# Patient Record
Sex: Female | Born: 1950
Health system: Southern US, Community
[De-identification: ages and names within clinical notes are randomized; demographics above are authoritative.]

## PROBLEM LIST (undated history)

## (undated) DIAGNOSIS — Z86711 Personal history of pulmonary embolism: Secondary | ICD-10-CM

## (undated) DIAGNOSIS — I10 Essential (primary) hypertension: Secondary | ICD-10-CM

## (undated) DIAGNOSIS — H269 Unspecified cataract: Secondary | ICD-10-CM

## (undated) DIAGNOSIS — N390 Urinary tract infection, site not specified: Secondary | ICD-10-CM

## (undated) DIAGNOSIS — E785 Hyperlipidemia, unspecified: Secondary | ICD-10-CM

## (undated) DIAGNOSIS — N8110 Cystocele, unspecified: Secondary | ICD-10-CM

## (undated) DIAGNOSIS — S22000A Wedge compression fracture of unspecified thoracic vertebra, initial encounter for closed fracture: Secondary | ICD-10-CM

## (undated) DIAGNOSIS — K219 Gastro-esophageal reflux disease without esophagitis: Secondary | ICD-10-CM

## (undated) DIAGNOSIS — L409 Psoriasis, unspecified: Secondary | ICD-10-CM

## (undated) HISTORY — PX: ABDOMINAL HYSTERECTOMY: SHX81

## (undated) HISTORY — DX: Cystocele, unspecified: N81.10

## (undated) HISTORY — PX: EYE SURGERY: SHX253

## (undated) HISTORY — PX: URETER REVISION: SHX493

## (undated) HISTORY — DX: Wedge compression fracture of unspecified thoracic vertebra, initial encounter for closed fracture: S22.000A

## (undated) HISTORY — PX: BLADDER SUSPENSION: SHX72

## (undated) HISTORY — DX: Unspecified cataract: H26.9

## (undated) HISTORY — PX: ANTERIOR AND POSTERIOR VAGINAL REPAIR: SUR5

## (undated) HISTORY — DX: Hyperlipidemia, unspecified: E78.5

## (undated) HISTORY — PX: FRACTURE SURGERY: SHX138

---
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
## 1998-07-11 ENCOUNTER — Ambulatory Visit (HOSPITAL_COMMUNITY): Admission: RE | Admit: 1998-07-11 | Discharge: 1998-07-11 | Payer: Self-pay | Admitting: *Deleted

## 1998-10-25 HISTORY — PX: OTHER SURGICAL HISTORY: SHX169

## 1998-11-25 HISTORY — PX: BLADDER SUSPENSION: SHX72

## 1998-12-15 ENCOUNTER — Inpatient Hospital Stay (HOSPITAL_COMMUNITY): Admission: EM | Admit: 1998-12-15 | Discharge: 1998-12-17 | Payer: Self-pay | Admitting: Obstetrics and Gynecology

## 1998-12-20 ENCOUNTER — Encounter: Payer: Self-pay | Admitting: Obstetrics and Gynecology

## 1998-12-20 ENCOUNTER — Inpatient Hospital Stay (HOSPITAL_COMMUNITY): Admission: EM | Admit: 1998-12-20 | Discharge: 1998-12-22 | Payer: Self-pay | Admitting: Emergency Medicine

## 1998-12-20 ENCOUNTER — Encounter: Payer: Self-pay | Admitting: Emergency Medicine

## 1998-12-31 ENCOUNTER — Inpatient Hospital Stay (HOSPITAL_COMMUNITY): Admission: EM | Admit: 1998-12-31 | Discharge: 1999-01-04 | Payer: Self-pay | Admitting: Urology

## 1998-12-31 ENCOUNTER — Encounter: Payer: Self-pay | Admitting: Urology

## 1999-07-29 ENCOUNTER — Encounter: Payer: Self-pay | Admitting: Family Medicine

## 1999-07-29 ENCOUNTER — Ambulatory Visit (HOSPITAL_COMMUNITY): Admission: RE | Admit: 1999-07-29 | Discharge: 1999-07-29 | Payer: Self-pay | Admitting: Family Medicine

## 2000-08-03 ENCOUNTER — Encounter: Payer: Self-pay | Admitting: Obstetrics and Gynecology

## 2000-08-03 ENCOUNTER — Ambulatory Visit (HOSPITAL_COMMUNITY): Admission: RE | Admit: 2000-08-03 | Discharge: 2000-08-03 | Payer: Self-pay | Admitting: Obstetrics and Gynecology

## 2001-08-04 ENCOUNTER — Encounter: Payer: Self-pay | Admitting: Obstetrics and Gynecology

## 2001-08-04 ENCOUNTER — Ambulatory Visit (HOSPITAL_COMMUNITY): Admission: RE | Admit: 2001-08-04 | Discharge: 2001-08-04 | Payer: Self-pay | Admitting: Obstetrics and Gynecology

## 2001-09-18 ENCOUNTER — Ambulatory Visit (HOSPITAL_COMMUNITY): Admission: RE | Admit: 2001-09-18 | Discharge: 2001-09-18 | Payer: Self-pay | Admitting: Internal Medicine

## 2001-09-18 ENCOUNTER — Encounter: Payer: Self-pay | Admitting: Internal Medicine

## 2002-08-10 ENCOUNTER — Encounter: Payer: Self-pay | Admitting: Family Medicine

## 2002-08-10 ENCOUNTER — Ambulatory Visit (HOSPITAL_COMMUNITY): Admission: RE | Admit: 2002-08-10 | Discharge: 2002-08-10 | Payer: Self-pay | Admitting: Family Medicine

## 2002-08-17 ENCOUNTER — Ambulatory Visit (HOSPITAL_COMMUNITY): Admission: RE | Admit: 2002-08-17 | Discharge: 2002-08-17 | Payer: Self-pay | Admitting: Gastroenterology

## 2002-08-17 ENCOUNTER — Encounter (INDEPENDENT_AMBULATORY_CARE_PROVIDER_SITE_OTHER): Payer: Self-pay | Admitting: *Deleted

## 2003-08-16 ENCOUNTER — Encounter: Payer: Self-pay | Admitting: Family Medicine

## 2003-08-16 ENCOUNTER — Ambulatory Visit (HOSPITAL_COMMUNITY): Admission: RE | Admit: 2003-08-16 | Discharge: 2003-08-16 | Payer: Self-pay | Admitting: Family Medicine

## 2004-09-04 ENCOUNTER — Ambulatory Visit (HOSPITAL_COMMUNITY): Admission: RE | Admit: 2004-09-04 | Discharge: 2004-09-04 | Payer: Self-pay | Admitting: Family Medicine

## 2005-11-16 ENCOUNTER — Ambulatory Visit (HOSPITAL_COMMUNITY): Admission: RE | Admit: 2005-11-16 | Discharge: 2005-11-16 | Payer: Self-pay | Admitting: Family Medicine

## 2006-11-18 ENCOUNTER — Ambulatory Visit (HOSPITAL_COMMUNITY): Admission: RE | Admit: 2006-11-18 | Discharge: 2006-11-18 | Payer: Self-pay | Admitting: Family Medicine

## 2007-11-23 ENCOUNTER — Ambulatory Visit (HOSPITAL_COMMUNITY): Admission: RE | Admit: 2007-11-23 | Discharge: 2007-11-23 | Payer: Self-pay | Admitting: Family Medicine

## 2008-04-23 ENCOUNTER — Ambulatory Visit (HOSPITAL_COMMUNITY): Admission: RE | Admit: 2008-04-23 | Discharge: 2008-04-23 | Payer: Self-pay | Admitting: Internal Medicine

## 2008-04-24 ENCOUNTER — Ambulatory Visit (HOSPITAL_COMMUNITY): Admission: RE | Admit: 2008-04-24 | Discharge: 2008-04-24 | Payer: Self-pay | Admitting: Internal Medicine

## 2008-05-03 ENCOUNTER — Encounter: Admission: RE | Admit: 2008-05-03 | Discharge: 2008-05-03 | Payer: Self-pay | Admitting: Internal Medicine

## 2008-07-02 ENCOUNTER — Ambulatory Visit (HOSPITAL_COMMUNITY): Admission: RE | Admit: 2008-07-02 | Discharge: 2008-07-02 | Payer: Self-pay | Admitting: Internal Medicine

## 2008-10-25 HISTORY — PX: EXTENSOR TENDON OF FOREARM / WRIST REPAIR: SHX1547

## 2008-12-06 ENCOUNTER — Ambulatory Visit (HOSPITAL_COMMUNITY): Admission: RE | Admit: 2008-12-06 | Discharge: 2008-12-06 | Payer: Self-pay | Admitting: Family Medicine

## 2009-08-22 ENCOUNTER — Ambulatory Visit (HOSPITAL_COMMUNITY): Admission: RE | Admit: 2009-08-22 | Discharge: 2009-08-22 | Payer: Self-pay | Admitting: Internal Medicine

## 2009-10-25 HISTORY — PX: BREAST CYST ASPIRATION: SHX578

## 2009-12-12 ENCOUNTER — Ambulatory Visit (HOSPITAL_COMMUNITY): Admission: RE | Admit: 2009-12-12 | Discharge: 2009-12-12 | Payer: Self-pay | Admitting: Internal Medicine

## 2009-12-22 ENCOUNTER — Encounter: Admission: RE | Admit: 2009-12-22 | Discharge: 2009-12-22 | Payer: Self-pay | Admitting: Internal Medicine

## 2010-12-09 ENCOUNTER — Other Ambulatory Visit (HOSPITAL_COMMUNITY): Payer: Self-pay | Admitting: Family Medicine

## 2010-12-09 DIAGNOSIS — Z1231 Encounter for screening mammogram for malignant neoplasm of breast: Secondary | ICD-10-CM

## 2010-12-24 ENCOUNTER — Ambulatory Visit (HOSPITAL_COMMUNITY)
Admission: RE | Admit: 2010-12-24 | Discharge: 2010-12-24 | Disposition: A | Discharge status: Home / Self Care | Payer: PRIVATE HEALTH INSURANCE | Source: Ambulatory Visit | Attending: Family Medicine | Admitting: Family Medicine

## 2010-12-24 DIAGNOSIS — Z1231 Encounter for screening mammogram for malignant neoplasm of breast: Secondary | ICD-10-CM | POA: Insufficient documentation

## 2010-12-29 ENCOUNTER — Ambulatory Visit (HOSPITAL_COMMUNITY)
Admission: RE | Admit: 2010-12-29 | Discharge: 2010-12-29 | Disposition: A | Discharge status: Home / Self Care | Payer: PRIVATE HEALTH INSURANCE | Source: Ambulatory Visit | Attending: Internal Medicine | Admitting: Internal Medicine

## 2010-12-29 ENCOUNTER — Other Ambulatory Visit (HOSPITAL_COMMUNITY): Payer: Self-pay | Admitting: Internal Medicine

## 2010-12-29 ENCOUNTER — Other Ambulatory Visit: Payer: Self-pay | Admitting: Family Medicine

## 2010-12-29 DIAGNOSIS — R072 Precordial pain: Secondary | ICD-10-CM | POA: Insufficient documentation

## 2010-12-29 DIAGNOSIS — R928 Other abnormal and inconclusive findings on diagnostic imaging of breast: Secondary | ICD-10-CM

## 2011-01-04 ENCOUNTER — Other Ambulatory Visit: Payer: PRIVATE HEALTH INSURANCE

## 2011-01-04 ENCOUNTER — Ambulatory Visit
Admission: RE | Admit: 2011-01-04 | Discharge: 2011-01-04 | Disposition: A | Discharge status: Home / Self Care | Payer: PRIVATE HEALTH INSURANCE | Source: Ambulatory Visit | Attending: Family Medicine | Admitting: Family Medicine

## 2011-01-04 DIAGNOSIS — R928 Other abnormal and inconclusive findings on diagnostic imaging of breast: Secondary | ICD-10-CM

## 2011-03-12 NOTE — Op Note (Signed)
   NAMESIANA, PANAMENO                         ACCOUNT NO.:  [id number]   MEDICAL RECORD NO.:  [id number]                   PATIENT TYPE:  AMB   LOCATION:  ENDO                                 FACILITY:  [facility]   PHYSICIAN:  Petra Kuba, M.D.                 DATE OF BIRTH:  Jun 19, 1951   DATE OF PROCEDURE:  08/17/2002  DATE OF DISCHARGE:                                 OPERATIVE REPORT   PROCEDURE:  Colonoscopy with polypectomy.   INDICATIONS:  Family history of colon cancer.  Consent was signed after  risks, benefits, methods and options were thoroughly discussed in the past.   MEDICATIONS USED:  1. Demerol 70 mg .  2. Versed 7 mg.   DESCRIPTION OF PROCEDURE:  Rectal inspection is pertinent for small external  hemorrhoids.  Digital examination was negative.  Video colonoscope was  inserted; easily advanced to the level of the ileocecal valve.  With  abdominal pressure we were able to advance into the cecal pole -- which was  identified by the appendiceal orifice and the ileocecal valve.  No obvious  abnormalities were seen on insertion.  The scope was slowly withdrawn.  The prep was adequate.  There was some liquid stool that required washing  and suctioning on slow withdrawal through the colon.  The cecum and  ascending were normal.  A small transverse polyp was seen; this was hot  biopsied x1.  Also another small mid descending polyp was seen and was hot  biopsied x1.  They were both placed in the same container.  No other  abnormalities were seen, as we slowly withdrew back to the rectum.  Once  back in the rectum the scope was then retroflexed, pertinent for some  internal hemorrhoids; possibly one had an old fissure.  Anal-rectal pull-  through confirmed the above.  The scope was reinserted a short way up the  left side of the colon.  Air was suctioned and scope removed.  The patient  tolerated the procedure well, there were no obvious immediate complication.   ENDOSCOPIC DIAGNOSES:  1. Internal/external hemorrhoids; questionably old fissure.  2. Two small polyps hot biopsied in the transverse and descending.  3. Otherwise within normal limits to the cecum.   PLAN:  Await pathology to determine the future colonic screening.  Have him  see back p.r.n., otherwise return to the care of Dr. Clarene Duke for the  customary health care maintenance -- to include yearly guaiac and rectals.                                                 Petra Kuba, M.D.    MEM/MEDQ  D:  08/17/2002  T:  08/17/2002  Job:  [id number]

## 2012-01-25 ENCOUNTER — Other Ambulatory Visit (HOSPITAL_COMMUNITY): Payer: Self-pay | Admitting: Internal Medicine

## 2012-01-25 DIAGNOSIS — Z1231 Encounter for screening mammogram for malignant neoplasm of breast: Secondary | ICD-10-CM

## 2012-02-18 ENCOUNTER — Ambulatory Visit (HOSPITAL_COMMUNITY)
Admission: RE | Admit: 2012-02-18 | Discharge: 2012-02-18 | Disposition: A | Discharge status: Home / Self Care | Payer: BC Managed Care – PPO | Source: Ambulatory Visit | Attending: Internal Medicine | Admitting: Internal Medicine

## 2012-02-18 DIAGNOSIS — Z1231 Encounter for screening mammogram for malignant neoplasm of breast: Secondary | ICD-10-CM

## 2012-02-22 ENCOUNTER — Other Ambulatory Visit: Payer: Self-pay | Admitting: Internal Medicine

## 2012-02-22 DIAGNOSIS — R928 Other abnormal and inconclusive findings on diagnostic imaging of breast: Secondary | ICD-10-CM

## 2012-02-24 ENCOUNTER — Ambulatory Visit
Admission: RE | Admit: 2012-02-24 | Discharge: 2012-02-24 | Disposition: A | Discharge status: Home / Self Care | Payer: BC Managed Care – PPO | Source: Ambulatory Visit | Attending: Internal Medicine | Admitting: Internal Medicine

## 2012-02-24 DIAGNOSIS — R928 Other abnormal and inconclusive findings on diagnostic imaging of breast: Secondary | ICD-10-CM

## 2012-03-23 ENCOUNTER — Other Ambulatory Visit: Payer: Self-pay | Admitting: Urology

## 2012-04-05 ENCOUNTER — Encounter (HOSPITAL_COMMUNITY): Payer: Self-pay | Admitting: Respiratory Therapy

## 2012-04-06 ENCOUNTER — Other Ambulatory Visit (HOSPITAL_COMMUNITY): Payer: Self-pay | Admitting: Orthopaedic Surgery

## 2012-04-06 ENCOUNTER — Encounter (HOSPITAL_COMMUNITY): Payer: Self-pay

## 2012-04-06 ENCOUNTER — Encounter (HOSPITAL_COMMUNITY)
Admission: RE | Admit: 2012-04-06 | Discharge: 2012-04-06 | Disposition: A | Discharge status: Home / Self Care | Payer: BC Managed Care – PPO | Source: Ambulatory Visit | Attending: Orthopaedic Surgery | Admitting: Orthopaedic Surgery

## 2012-04-06 HISTORY — DX: Psoriasis, unspecified: L40.9

## 2012-04-06 HISTORY — DX: Gastro-esophageal reflux disease without esophagitis: K21.9

## 2012-04-06 HISTORY — DX: Urinary tract infection, site not specified: N39.0

## 2012-04-06 LAB — CBC
HCT: 42.2 % (ref 36.0–46.0)
Hemoglobin: 14.3 g/dL (ref 12.0–15.0)
MCV: 89.2 fL (ref 78.0–100.0)
RBC: 4.73 MIL/uL (ref 3.87–5.11)
RDW: 12.9 % (ref 11.5–15.5)
WBC: 8.5 10*3/uL (ref 4.0–10.5)

## 2012-04-06 LAB — SURGICAL PCR SCREEN: Staphylococcus aureus: POSITIVE — AB

## 2012-04-06 MED ORDER — CEFAZOLIN SODIUM-DEXTROSE 2-3 GM-% IV SOLR
2.0000 g | INTRAVENOUS | Status: DC
  Filled 2012-04-06: qty 50

## 2012-04-06 MED ORDER — CEFAZOLIN SODIUM 1-5 GM-% IV SOLN: 1.0000 g | INTRAVENOUS | Status: DC

## 2012-04-06 NOTE — Pre-Procedure Instructions (Signed)
20 Vanessa French  04/06/2012   Your procedure is scheduled on:  04-07-2012  Report to [facility] at 5:30 AM.  Call this number if you have problems the morning of surgery: [phone]   Remember:   Do not eat food:After Midnight.     Take these medicines the morning of surgery with A SIP OF WATER:Omeprazole,Oxycodone if needed   Do not wear jewelry, make-up or nail polish.  Do not wear lotions, powders, or perfumes. You may wear deodorant.  Do not shave 48 hours prior to surgery. Men may shave face and neck.  Do not bring valuables to the hospital.  Contacts, dentures or bridgework may not be worn into surgery.  Leave suitcase in the car. After surgery it may be brought to your room.  For patients admitted to the hospital, checkout time is 11:00 AM the day of discharge.      Special Instructions: CHG Shower Use Special Wash: 1/2 bottle night before surgery and 1/2 bottle morning of surgery.   Please read over the following fact sheets that you were given: Pain Booklet, Coughing and Deep Breathing, MRSA Information and Surgical Site Infection Prevention

## 2012-04-06 NOTE — Progress Notes (Signed)
Office notified of Ancef  Order with documented penicillin allergy. Dr. [name] states okay to give Ancef.

## 2012-04-06 NOTE — H&P (Signed)
  [facility] [employer] [facility], [place] [address], [place], [place] [address] Telephone: [phone] Fax: [phone]  PATIENT: Vanessa, French MR#: [id number] Visit Date: 04/04/2012 CHIEF COMPLAINT: A 61 year-old-female who [place] and [place] suffering a right dominant proximal humerus fracture.   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 Her arm is in a sling and she has pain with range of motion.  CURRENT MEDICATIONS: She is on vitamin D, multivitamins, and also over-the-counter Prilosec plus she has some pain medication for her, fractures only medications.   PREVIOUS SURGERIES: Tonsillectomy and appendectomy as a child, bladder surgery 2000, hand surgery 2011.  FAMILY HISTORY: Positive for colon cancer and hypertension.  SOCIAL HISTORY: She is married to her husband Dimas Aguas. She works as a Charity fundraiser. She does not smoke or drink.  REVIEW OF SYSTEMS: Positive for acid reflux, bladder problems and osteoporosis.   PHYSICAL EXAMINATION: Patient is 5 feet 6inches, 198 pounds, WD, WN, NAD, extraocular movements intact. Good range of motion of the cervical spine. Lungs are clear to auscultation. Heart has regular rate and  rhythm without murmur or gallop. Abdomen is soft and nontender. Radial motor EPL/EVC is active. Median ulnar sensation to the right and left upper extremity is normal.   RADIOGRAPHS: X-rays from [facility] shows a proximal humerus fracture with displacement.  Greater tuberosity appears to be attached to the proximal fragment as does the lesser tuberosity and this extends across the anatomic neck with displacement.   PLAN: We discussed options with her. This is her dominant hand.  Plan would be open reduction internal fixation, plate fixation, preoperative Scalene black, overnight stay in the hospital and office followup 1 week postop. Risks of surgery  discussed, risks of bleeding and infection discussed, risks of damage to arterial venous vessels, nerve neurapraxia with problems with numbness or weakness discussed. She understands and requests to proceed. All questions answered.  Eldred Manges, MD  Auto-Authenticated by Veverly Fells [name], MD.

## 2012-04-06 NOTE — Progress Notes (Signed)
Orders not signed until after pre-admit appointment,will need to do additional labs, EKG and CXR on arrival.

## 2012-04-06 NOTE — H&P (Signed)
  [facility] [employer] [facility], [place] [address] [place], [place] [address] Telephone: [phone] Fax: [phone]  PATIENT: Vanessa French, Vanessa French MR#: [id number] Visit Date: 04/04/2012 CHIEF COMPLAINT: A 61 year-old-female who [place] and [place] suffering a right dominant proximal humerus fracture.   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 Her arm is in a sling and she has pain with range of motion.  CURRENT MEDICATIONS: She is on vitamin D, multivitamins, and also over-the-counter Prilosec plus she has some pain medication for her, fractures only medications.   PREVIOUS SURGERIES: Tonsillectomy and appendectomy as a child, bladder surgery 2000, hand surgery 2011.  FAMILY HISTORY: Positive for colon cancer and hypertension.  SOCIAL HISTORY: She is married to her husband Dimas Aguas. She works as a Charity fundraiser. She does not smoke or drink.  REVIEW OF SYSTEMS: Positive for acid reflux, bladder problems and osteoporosis.   PHYSICAL EXAMINATION: Patient is 5 feet 6inches, 198 pounds, WD, WN, NAD, extraocular movements intact. Good range of motion of the cervical spine. Lungs are clear to auscultation. Heart has regular rate and  rhythm without murmur or gallop. Abdomen is soft and nontender. Radial motor EPL/EVC is active. Median ulnar sensation to the right and left upper extremity is normal.   RADIOGRAPHS: X-rays from [facility] shows a proximal humerus fracture with displacement.  Greater tuberosity appears to be attached to the proximal fragment as does the lesser tuberosity and this extends across the anatomic neck with displacement.   PLAN: We discussed options with her. This is her dominant hand.  Plan would be open reduction internal fixation, plate fixation, preoperative Scalene black, overnight stay in the hospital and office followup 1 week postop. Risks of surgery  discussed, risks of bleeding and infection discussed, risks of damage to arterial venous vessels, nerve neurapraxia with problems with numbness or weakness discussed. She understands and requests to proceed. All questions answered.  Eldred Manges, MD

## 2012-04-06 NOTE — Progress Notes (Signed)
Dr. [name] office notified that we need orders .

## 2012-04-07 ENCOUNTER — Encounter (HOSPITAL_COMMUNITY): Payer: Self-pay | Admitting: *Deleted

## 2012-04-07 ENCOUNTER — Ambulatory Visit (HOSPITAL_COMMUNITY): Payer: BC Managed Care – PPO | Admitting: Critical Care Medicine

## 2012-04-07 ENCOUNTER — Ambulatory Visit (HOSPITAL_COMMUNITY): Payer: BC Managed Care – PPO

## 2012-04-07 ENCOUNTER — Ambulatory Visit (HOSPITAL_COMMUNITY)
Admission: RE | Admit: 2012-04-07 | Discharge: 2012-04-08 | DRG: 219 | Disposition: A | Discharge status: Home / Self Care | Payer: BC Managed Care – PPO | Source: Ambulatory Visit | Attending: Orthopaedic Surgery | Admitting: Orthopaedic Surgery

## 2012-04-07 ENCOUNTER — Encounter (HOSPITAL_COMMUNITY): Payer: Self-pay | Admitting: Critical Care Medicine

## 2012-04-07 ENCOUNTER — Encounter (HOSPITAL_COMMUNITY): Payer: Self-pay | Admitting: General Practice

## 2012-04-07 ENCOUNTER — Encounter (HOSPITAL_COMMUNITY)
Admission: RE | Disposition: A | Discharge status: Home / Self Care | Payer: Self-pay | Source: Ambulatory Visit | Attending: Orthopaedic Surgery

## 2012-04-07 DIAGNOSIS — L408 Other psoriasis: Secondary | ICD-10-CM | POA: Insufficient documentation

## 2012-04-07 DIAGNOSIS — S42201A Unspecified fracture of upper end of right humerus, initial encounter for closed fracture: Secondary | ICD-10-CM

## 2012-04-07 DIAGNOSIS — Y998 Other external cause status: Secondary | ICD-10-CM | POA: Insufficient documentation

## 2012-04-07 DIAGNOSIS — S42209A Unspecified fracture of upper end of unspecified humerus, initial encounter for closed fracture: Secondary | ICD-10-CM | POA: Insufficient documentation

## 2012-04-07 DIAGNOSIS — M65849 Other synovitis and tenosynovitis, unspecified hand: Secondary | ICD-10-CM | POA: Insufficient documentation

## 2012-04-07 DIAGNOSIS — Z01812 Encounter for preprocedural laboratory examination: Secondary | ICD-10-CM | POA: Insufficient documentation

## 2012-04-07 DIAGNOSIS — K219 Gastro-esophageal reflux disease without esophagitis: Secondary | ICD-10-CM | POA: Insufficient documentation

## 2012-04-07 DIAGNOSIS — M65839 Other synovitis and tenosynovitis, unspecified forearm: Secondary | ICD-10-CM | POA: Insufficient documentation

## 2012-04-07 DIAGNOSIS — W64XXXA Exposure to other animate mechanical forces, initial encounter: Secondary | ICD-10-CM | POA: Insufficient documentation

## 2012-04-07 HISTORY — PX: STERIOD INJECTION: SHX5046

## 2012-04-07 HISTORY — PX: ORIF HUMERUS FRACTURE: SHX2126

## 2012-04-07 HISTORY — DX: Unspecified fracture of upper end of right humerus, initial encounter for closed fracture: S42.201A

## 2012-04-07 LAB — COMPREHENSIVE METABOLIC PANEL
ALT: 20 U/L (ref 0–35)
Alkaline Phosphatase: 70 U/L (ref 39–117)
BUN: 12 mg/dL (ref 6–23)
CO2: 29 mEq/L (ref 19–32)
Chloride: 103 mEq/L (ref 96–112)
GFR calc Af Amer: >90 mL/min (ref >90)
GFR calc non Af Amer: >90 mL/min (ref >90)
Glucose, Bld: 109 mg/dL — ABNORMAL HIGH (ref 70–99)
Potassium: 4.5 mEq/L (ref 3.5–5.1)
Sodium: 141 mEq/L (ref 135–145)
Total Bilirubin: 0.6 mg/dL (ref 0.3–1.2)
Total Protein: 7.2 g/dL (ref 6.0–8.3)

## 2012-04-07 LAB — PROTIME-INR: Prothrombin Time: 14.4 seconds (ref 11.6–15.2)

## 2012-04-07 SURGERY — OPEN REDUCTION INTERNAL FIXATION (ORIF) PROXIMAL HUMERUS FRACTURE
Anesthesia: General | Site: Wrist | Laterality: Right | Wound class: Clean

## 2012-04-07 MED ORDER — LACTATED RINGERS IV SOLN
INTRAVENOUS | Status: DC | PRN
  Administered 2012-04-07 (×2): via INTRAVENOUS

## 2012-04-07 MED ORDER — ACETAMINOPHEN 10 MG/ML IV SOLN
INTRAVENOUS | Status: DC | PRN
  Administered 2012-04-07: 1000 mg via INTRAVENOUS

## 2012-04-07 MED ORDER — DIPHENHYDRAMINE HCL 12.5 MG/5ML PO ELIX: 12.5000 mg | ORAL_SOLUTION | ORAL | Status: DC | PRN

## 2012-04-07 MED ORDER — VANCOMYCIN HCL IN DEXTROSE 1-5 GM/200ML-% IV SOLN
1000.0000 mg | Freq: Two times a day (BID) | INTRAVENOUS | Status: AC
  Administered 2012-04-07: 1000 mg via INTRAVENOUS
  Filled 2012-04-07: qty 200

## 2012-04-07 MED ORDER — PHENOL 1.4 % MT LIQD: 1.0000 | OROMUCOSAL | Status: DC | PRN

## 2012-04-07 MED ORDER — ONDANSETRON HCL 4 MG PO TABS: 4.0000 mg | ORAL_TABLET | Freq: Four times a day (QID) | ORAL | Status: DC | PRN

## 2012-04-07 MED ORDER — DOCUSATE SODIUM 100 MG PO CAPS
100.0000 mg | ORAL_CAPSULE | Freq: Two times a day (BID) | ORAL | Status: DC
  Administered 2012-04-07 – 2012-04-08 (×3): 100 mg via ORAL
  Filled 2012-04-07 (×4): qty 1

## 2012-04-07 MED ORDER — METHOCARBAMOL 100 MG/ML IJ SOLN
500.0000 mg | Freq: Four times a day (QID) | INTRAVENOUS | Status: DC | PRN
  Filled 2012-04-07: qty 5

## 2012-04-07 MED ORDER — SENNOSIDES-DOCUSATE SODIUM 8.6-50 MG PO TABS: 1.0000 | ORAL_TABLET | Freq: Every evening | ORAL | Status: DC | PRN

## 2012-04-07 MED ORDER — METHOCARBAMOL 500 MG PO TABS
500.0000 mg | ORAL_TABLET | Freq: Four times a day (QID) | ORAL | Status: DC | PRN
  Administered 2012-04-07 – 2012-04-08 (×2): 500 mg via ORAL
  Filled 2012-04-07 (×2): qty 1

## 2012-04-07 MED ORDER — OXYCODONE-ACETAMINOPHEN 5-325 MG PO TABS: 1.0000 | ORAL_TABLET | ORAL | Status: AC | PRN

## 2012-04-07 MED ORDER — NEOSTIGMINE METHYLSULFATE 1 MG/ML IJ SOLN
INTRAMUSCULAR | Status: DC | PRN
  Administered 2012-04-07: 4 mg via INTRAVENOUS

## 2012-04-07 MED ORDER — ROCURONIUM BROMIDE 100 MG/10ML IV SOLN
INTRAVENOUS | Status: DC | PRN
  Administered 2012-04-07: 50 mg via INTRAVENOUS

## 2012-04-07 MED ORDER — PROPOFOL 10 MG/ML IV EMUL
INTRAVENOUS | Status: DC | PRN
  Administered 2012-04-07: 150 mg via INTRAVENOUS

## 2012-04-07 MED ORDER — BUPIVACAINE HCL 0.5 % IJ SOLN
INTRAMUSCULAR | Status: DC | PRN
  Administered 2012-04-07: 2 mL via INTRA_ARTICULAR

## 2012-04-07 MED ORDER — CEFAZOLIN SODIUM-DEXTROSE 2-3 GM-% IV SOLR
2.0000 g | INTRAVENOUS | Status: AC
  Administered 2012-04-07: 2 g via INTRAVENOUS

## 2012-04-07 MED ORDER — ACETAMINOPHEN 10 MG/ML IV SOLN
INTRAVENOUS | Status: AC
Start: 2012-04-07 — End: 2012-04-07
  Filled 2012-04-07: qty 100

## 2012-04-07 MED ORDER — LORAZEPAM 2 MG/ML IJ SOLN: 1.0000 mg | Freq: Once | INTRAMUSCULAR | Status: DC | PRN

## 2012-04-07 MED ORDER — PHENYLEPHRINE HCL 10 MG/ML IJ SOLN
10.0000 mg | INTRAVENOUS | Status: DC | PRN
  Administered 2012-04-07: 25 ug/min via INTRAVENOUS

## 2012-04-07 MED ORDER — MIDAZOLAM HCL 2 MG/2ML IJ SOLN: 1.0000 mg | INTRAMUSCULAR | Status: DC | PRN

## 2012-04-07 MED ORDER — MUPIROCIN 2 % EX OINT
TOPICAL_OINTMENT | CUTANEOUS | Status: AC
  Administered 2012-04-07: 1
  Filled 2012-04-07: qty 22

## 2012-04-07 MED ORDER — DEXAMETHASONE SODIUM PHOSPHATE 4 MG/ML IJ SOLN
INTRAMUSCULAR | Status: DC | PRN
  Administered 2012-04-07: 4 mg via INTRAVENOUS

## 2012-04-07 MED ORDER — ACETAMINOPHEN 325 MG PO TABS: 650.0000 mg | ORAL_TABLET | Freq: Four times a day (QID) | ORAL | Status: DC | PRN

## 2012-04-07 MED ORDER — VECURONIUM BROMIDE 10 MG IV SOLR
INTRAVENOUS | Status: DC | PRN
  Administered 2012-04-07: 2 mg via INTRAVENOUS

## 2012-04-07 MED ORDER — BISACODYL 10 MG RE SUPP: 10.0000 mg | Freq: Every day | RECTAL | Status: DC | PRN

## 2012-04-07 MED ORDER — PHENYLEPHRINE HCL 10 MG/ML IJ SOLN
INTRAMUSCULAR | Status: DC | PRN
  Administered 2012-04-07 (×3): 40 ug via INTRAVENOUS

## 2012-04-07 MED ORDER — FENTANYL CITRATE 0.05 MG/ML IJ SOLN: 50.0000 ug | INTRAMUSCULAR | Status: DC | PRN

## 2012-04-07 MED ORDER — CALCIUM CARBONATE ANTACID 500 MG PO CHEW
1.5000 | CHEWABLE_TABLET | Freq: Every day | ORAL | Status: DC
  Administered 2012-04-07: 200 mg via ORAL
  Filled 2012-04-07 (×2): qty 1.5

## 2012-04-07 MED ORDER — KCL IN DEXTROSE-NACL 20-5-0.45 MEQ/L-%-% IV SOLN
INTRAVENOUS | Status: DC
  Administered 2012-04-07: 13:00:00 via INTRAVENOUS
  Filled 2012-04-07 (×3): qty 1000

## 2012-04-07 MED ORDER — METOCLOPRAMIDE HCL 10 MG PO TABS: 5.0000 mg | ORAL_TABLET | Freq: Three times a day (TID) | ORAL | Status: DC | PRN

## 2012-04-07 MED ORDER — LIDOCAINE HCL (CARDIAC) 20 MG/ML IV SOLN
INTRAVENOUS | Status: DC | PRN
  Administered 2012-04-07: 50 mg via INTRAVENOUS

## 2012-04-07 MED ORDER — CALCIUM CARBONATE ANTACID 750 MG PO CHEW: 1.0000 | CHEWABLE_TABLET | Freq: Every day | ORAL | Status: DC

## 2012-04-07 MED ORDER — MIDAZOLAM HCL 5 MG/5ML IJ SOLN
INTRAMUSCULAR | Status: DC | PRN
  Administered 2012-04-07: 0.5 mg via INTRAVENOUS
  Administered 2012-04-07: 1 mg via INTRAVENOUS
  Administered 2012-04-07: 0.5 mg via INTRAVENOUS

## 2012-04-07 MED ORDER — MENTHOL 3 MG MT LOZG: 1.0000 | LOZENGE | OROMUCOSAL | Status: DC | PRN

## 2012-04-07 MED ORDER — ACETAMINOPHEN 650 MG RE SUPP: 650.0000 mg | Freq: Four times a day (QID) | RECTAL | Status: DC | PRN

## 2012-04-07 MED ORDER — ONDANSETRON HCL 4 MG/2ML IJ SOLN
INTRAMUSCULAR | Status: DC | PRN
  Administered 2012-04-07: 4 mg via INTRAVENOUS

## 2012-04-07 MED ORDER — METHOCARBAMOL 500 MG PO TABS: 500.0000 mg | ORAL_TABLET | Freq: Four times a day (QID) | ORAL | Status: AC

## 2012-04-07 MED ORDER — 0.9 % SODIUM CHLORIDE (POUR BTL) OPTIME
TOPICAL | Status: DC | PRN
  Administered 2012-04-07: 1000 mL

## 2012-04-07 MED ORDER — HYDROCODONE-ACETAMINOPHEN 5-325 MG PO TABS
1.0000 | ORAL_TABLET | ORAL | Status: DC | PRN
  Administered 2012-04-07: 1 via ORAL
  Administered 2012-04-08: 2 via ORAL
  Filled 2012-04-07: qty 2
  Filled 2012-04-07: qty 1

## 2012-04-07 MED ORDER — MORPHINE SULFATE 2 MG/ML IJ SOLN: 1.0000 mg | INTRAMUSCULAR | Status: DC | PRN

## 2012-04-07 MED ORDER — FENTANYL CITRATE 0.05 MG/ML IJ SOLN
INTRAMUSCULAR | Status: DC | PRN
  Administered 2012-04-07: 50 ug via INTRAVENOUS
  Administered 2012-04-07: 25 ug via INTRAVENOUS
  Administered 2012-04-07: 75 ug via INTRAVENOUS
  Administered 2012-04-07: 50 ug via INTRAVENOUS
  Administered 2012-04-07: 25 ug via INTRAVENOUS

## 2012-04-07 MED ORDER — METHYLPREDNISOLONE ACETATE 40 MG/ML IJ SUSP
INTRAMUSCULAR | Status: DC | PRN
  Administered 2012-04-07: 40 mg via INTRA_ARTICULAR

## 2012-04-07 MED ORDER — PANTOPRAZOLE SODIUM 40 MG PO TBEC
40.0000 mg | DELAYED_RELEASE_TABLET | Freq: Every day | ORAL | Status: DC
  Administered 2012-04-07: 40 mg via ORAL
  Filled 2012-04-07: qty 1

## 2012-04-07 MED ORDER — GLYCOPYRROLATE 0.2 MG/ML IJ SOLN
INTRAMUSCULAR | Status: DC | PRN
  Administered 2012-04-07: 0.6 mg via INTRAVENOUS

## 2012-04-07 MED ORDER — KETOROLAC TROMETHAMINE 30 MG/ML IJ SOLN
30.0000 mg | Freq: Four times a day (QID) | INTRAMUSCULAR | Status: DC
  Administered 2012-04-07 (×2): 30 mg via INTRAVENOUS
  Filled 2012-04-07 (×6): qty 1

## 2012-04-07 MED ORDER — METOCLOPRAMIDE HCL 5 MG/ML IJ SOLN: 5.0000 mg | Freq: Three times a day (TID) | INTRAMUSCULAR | Status: DC | PRN

## 2012-04-07 MED ORDER — OXYCODONE-ACETAMINOPHEN 5-325 MG PO TABS: 1.0000 | ORAL_TABLET | ORAL | Status: DC | PRN

## 2012-04-07 MED ORDER — HYDROMORPHONE HCL PF 1 MG/ML IJ SOLN: 0.2500 mg | INTRAMUSCULAR | Status: DC | PRN

## 2012-04-07 MED ORDER — ZOLPIDEM TARTRATE 5 MG PO TABS: 5.0000 mg | ORAL_TABLET | Freq: Every evening | ORAL | Status: DC | PRN

## 2012-04-07 MED ORDER — ONDANSETRON HCL 4 MG/2ML IJ SOLN: 4.0000 mg | Freq: Four times a day (QID) | INTRAMUSCULAR | Status: DC | PRN

## 2012-04-07 SURGICAL SUPPLY — 59 items
3.8mm Multi Directional screw x20mm ×1 IMPLANT
ADH SKN CLS APL DERMABOND .7 (GAUZE/BANDAGES/DRESSINGS) ×2
APL SKNCLS STERI-STRIP NONHPOA (GAUZE/BANDAGES/DRESSINGS) ×2
BENZOIN TINCTURE PRP APPL 2/3 (GAUZE/BANDAGES/DRESSINGS) ×3 IMPLANT
BIT DRILL 2.8X4 QC CORT (BIT) ×1 IMPLANT
BIT DRILL 4 LONG FAST STEP (BIT) ×2 IMPLANT
BIT DRILL 4 SHORT FAST STEP (BIT) ×1 IMPLANT
CLOTH BEACON ORANGE TIMEOUT ST (SAFETY) ×3 IMPLANT
COVER SURGICAL LIGHT HANDLE (MISCELLANEOUS) ×3 IMPLANT
DERMABOND ADVANCED (GAUZE/BANDAGES/DRESSINGS) ×1
DERMABOND ADVANCED .7 DNX12 (GAUZE/BANDAGES/DRESSINGS) IMPLANT
DRAPE C-ARM 42X72 X-RAY (DRAPES) ×3 IMPLANT
DRAPE INCISE IOBAN 66X45 STRL (DRAPES) IMPLANT
DRAPE SURG 17X23 STRL (DRAPES) ×3 IMPLANT
DRAPE U-SHAPE 47X51 STRL (DRAPES) ×3 IMPLANT
DRSG EMULSION OIL 3X3 NADH (GAUZE/BANDAGES/DRESSINGS) ×3 IMPLANT
DRSG MEPILEX BORDER 4X4 (GAUZE/BANDAGES/DRESSINGS) ×2 IMPLANT
DRSG MEPILEX BORDER 4X8 (GAUZE/BANDAGES/DRESSINGS) ×1 IMPLANT
ELECT REM PT RETURN 9FT ADLT (ELECTROSURGICAL) ×3
ELECTRODE REM PT RTRN 9FT ADLT (ELECTROSURGICAL) ×2 IMPLANT
GLOVE BIOGEL PI IND STRL 7.5 (GLOVE) ×2 IMPLANT
GLOVE BIOGEL PI IND STRL 8 (GLOVE) ×2 IMPLANT
GLOVE BIOGEL PI INDICATOR 7.5 (GLOVE) ×1
GLOVE BIOGEL PI INDICATOR 8 (GLOVE) ×1
GLOVE ECLIPSE 7.0 STRL STRAW (GLOVE) ×3 IMPLANT
GLOVE ORTHO TXT STRL SZ7.5 (GLOVE) ×3 IMPLANT
GOWN PREVENTION PLUS LG XLONG (DISPOSABLE) IMPLANT
GOWN STRL NON-REIN LRG LVL3 (GOWN DISPOSABLE) ×3 IMPLANT
KIT BASIN OR (CUSTOM PROCEDURE TRAY) ×3 IMPLANT
KIT ROOM TURNOVER OR (KITS) ×3 IMPLANT
MANIFOLD NEPTUNE II (INSTRUMENTS) ×3 IMPLANT
NDL HYPO 25GX1X1/2 BEV (NEEDLE) IMPLANT
NEEDLE HYPO 25GX1X1/2 BEV (NEEDLE) IMPLANT
NS IRRIG 1000ML POUR BTL (IV SOLUTION) ×3 IMPLANT
PACK SHOULDER (CUSTOM PROCEDURE TRAY) ×3 IMPLANT
PAD ARMBOARD 7.5X6 YLW CONV (MISCELLANEOUS) ×6 IMPLANT
PEG STND 4.0X37.5MM (Orthopedic Implant) ×3 IMPLANT
PEG STND 4.0X40MM (Orthopedic Implant) ×12 IMPLANT
PEG STND 4.0X47.5MM (Orthopedic Implant) ×3 IMPLANT
PEGSTD 4.0X37.5MM (Orthopedic Implant) IMPLANT
PEGSTD 4.0X40MM (Orthopedic Implant) ×4 IMPLANT
PEGSTD 4.0X47.5MM (Orthopedic Implant) ×1 IMPLANT
PIN GUIDE SHOULDER 2.0MM (PIN) ×2 IMPLANT
PLATE SHOULDER S3 3HOLE RT (Plate) ×1 IMPLANT
SCREW MULTIDIR 3.8X24 HUMRL (Screw) ×3 IMPLANT
SCREW MULTIDIR 3.8X30 HUMRL (Screw) ×1 IMPLANT
SCREW MULTIDIR 3.8X34 HUMRL (Screw) ×1 IMPLANT
SCREW MULTIDIR 3.8X38 HUMRL (Screw) ×1 IMPLANT
SPONGE GAUZE 4X4 12PLY (GAUZE/BANDAGES/DRESSINGS) ×3 IMPLANT
SPONGE LAP 18X18 X RAY DECT (DISPOSABLE) ×6 IMPLANT
STRIP CLOSURE SKIN 1/2X4 (GAUZE/BANDAGES/DRESSINGS) ×3 IMPLANT
SUCTION FRAZIER TIP 10 FR DISP (SUCTIONS) ×3 IMPLANT
SUT FIBERWIRE #2 38 T-5 BLUE (SUTURE)
SUT VIC AB 2-0 CT1 27 (SUTURE) ×3
SUT VIC AB 2-0 CT1 TAPERPNT 27 (SUTURE) ×2 IMPLANT
SUT VIC AB 3-0 FS2 27 (SUTURE) ×3 IMPLANT
SUTURE FIBERWR #2 38 T-5 BLUE (SUTURE) IMPLANT
SYR CONTROL 10ML LL (SYRINGE) IMPLANT
WATER STERILE IRR 1000ML POUR (IV SOLUTION) ×3 IMPLANT

## 2012-04-07 NOTE — Preoperative (Signed)
Beta Blockers   Reason not to administer Beta Blockers:Not Applicable 

## 2012-04-07 NOTE — Interval H&P Note (Signed)
History and Physical Interval Note:  04/07/2012 7:31 AM  Vanessa French  has presented today for surgery, with the diagnosis of Right Proximal Humerus Fracture  The various methods of treatment have been discussed with the patient and family. After consideration of risks, benefits and other options for treatment, the patient has consented to  Procedure(s) (LRB): OPEN REDUCTION INTERNAL FIXATION (ORIF) PROXIMAL HUMERUS FRACTURE (Right) STEROID INJECTION (Right) as a surgical intervention .  The patients' history has been reviewed, patient examined, no change in status, stable for surgery.  I have reviewed the patients' chart and labs.  Questions were answered to the patient's satisfaction.     Sheamus Hasting C   

## 2012-04-07 NOTE — Anesthesia Postprocedure Evaluation (Signed)
  Anesthesia Post-op Note  Patient: Vanessa French  Procedure(s) Performed: Procedure(s) (LRB): OPEN REDUCTION INTERNAL FIXATION (ORIF) PROXIMAL HUMERUS FRACTURE (Right) STEROID INJECTION (Right)  Patient Location: PACU  Anesthesia Type: General and GA combined with regional for post-op pain  Level of Consciousness: awake and alert   Airway and Oxygen Therapy: Patient Spontanous Breathing  Post-op Pain: none  Post-op Assessment: Post-op Vital signs reviewed, Patient's Cardiovascular Status Stable, Respiratory Function Stable, Patent Airway, No signs of Nausea or vomiting and Pain level controlled  Post-op Vital Signs: stable  Complications: No apparent anesthesia complications

## 2012-04-07 NOTE — Anesthesia Procedure Notes (Addendum)
Anesthesia Regional Block:  Interscalene brachial plexus block  Pre-Anesthetic Checklist: ,, timeout performed, Correct Patient, Correct Site, Correct Laterality, Correct Procedure, Correct Position, site marked, Risks and benefits discussed,  Surgical consent,  Pre-op evaluation,  At surgeon's request and post-op pain management  Laterality: Right  Prep: chloraprep       Needles:  Injection technique: Single-shot  Needle Type: Stimulator Needle - 40      Needle Gauge: 22 and 22 G    Additional Needles:  Procedures: nerve stimulator Interscalene brachial plexus block  Nerve Stimulator or Paresthesia:  Response: 0.48 mA,   Additional Responses:   Narrative:  Start time: 04/07/2012 7:04 AM End time: 04/07/2012 7:16 AM Injection made incrementally with aspirations every 5 mL. Anesthesiologist: Dr Gypsy Balsam  Additional Notes: 1610-9604 R ISB POP CHG prep, sterile tech #22 stim needle w/stim down to .48ma Multiple neg asp Marc .5% w/epi 1:200000 25cc+decadron 4mg  infiltrated No compl Dr Gypsy Balsam   Procedure Name: Intubation Date/Time: 04/07/2012 7:45 AM Performed by: Elon Alas Pre-anesthesia Checklist: Patient identified, Timeout performed, Emergency Drugs available, Patient being monitored and Suction available Patient Re-evaluated:Patient Re-evaluated prior to inductionOxygen Delivery Method: Circle system utilized Preoxygenation: Pre-oxygenation with 100% oxygen Intubation Type: IV induction and Cricoid Pressure applied Ventilation: Mask ventilation without difficulty Laryngoscope Size: Mac and 3 Grade View: Grade III Tube type: Oral Tube size: 7.5 mm Number of attempts: 1 Airway Equipment and Method: Stylet Placement Confirmation: positive ETCO2,  ETT inserted through vocal cords under direct vision and breath sounds checked- equal and bilateral Secured at: 21 cm Tube secured with: Tape Dental Injury: Teeth and Oropharynx as per pre-operative assessment

## 2012-04-07 NOTE — Op Note (Signed)
NAMEKENNESHIA, REHM               ACCOUNT NO.:  [id number]  MEDICAL RECORD NO.:  [id number]  LOCATION:  [facility]                         FACILITY:  [facility]  PHYSICIAN:  Carmelia Tiner C. [name], M.D.    DATE OF BIRTH:  01-Jun-1951  DATE OF PROCEDURE:  04/07/2012 DATE OF DISCHARGE:                              OPERATIVE REPORT   PREOPERATIVE DIAGNOSES:  Comminuted right proximal humerus fracture, first dorsal compartment tenosynovitis, right wrist.  POSTOPERATIVE DIAGNOSES:  Comminuted right proximal humerus fracture, first dorsal compartment tenosynovitis, right wrist.  PROCEDURE:  Open reduction and internal fixation, right proximal humerus.  Biomet anatomic proximal humerus 3-hole plate and right 1st dorsal compartment cortisone/Marcaine injection.  SURGEON:  Cache Bills C. [name], MD  ASSISTANT:  Maud Deed PA-C medically necessary and present for the entire procedure.  DRAINS:  1 Hemovac shoulder.  ANESTHESIA:  GOT plus preoperative scalene block plus 10 mL of Marcaine in the skin of the shoulder and 1 mL Marcaine with 1 mL Depo-Medrol 1st dorsal compartment wrist injection.  COMPLICATIONS:  None.  EBL:  Less than 100 mL.  PROCEDURE:  After induction of general anesthesia, standard prepping and draping.  Prior to prepping and draping, the wrist was prepped with chlorhexidine and alcohol, and using sterile technique, 1st dorsal compartment injection was performed with 25 mL needle filling the 1st dorsal compartment filler.  1 mL Depo-Medrol, 1 mL Marcaine was used. Prepping was then performed with the patient in the beach-chair position with head holder.  Opposite arm was placed on a Mayo stand with the pillow careful padding.  Split sheets, drapes, impervious stockinette, Coban, sterile Betadine, Steri-drapes x2 were applied for fixation.  Deltopectoral incision skin marker had been planned outlined in the clavicle, coracoid, and acromion.  Time-out procedure was completed. Prior to  injection of the wrist, many time-out completed before the incision on the shoulder.  The deltopectoral groove was exposed.  There was no cephalic vein and the deltopectoral groove interval between the pectoralis and deltoid was easily found.  Long head of biceps tendon was exposed and narrow Homan was placed lateral.  Plate was selected, placed on the femur, adjusted in height.  Reduction of the fragments were performed.  This was made easier with 3 green towels in the axilla, initially a K-wire was drilled through the greater tuberosity.  Bone was extremely soft despite putting it down into the shaft through the cortex.  There was minimal fixation in the fracture still wildly.  Plate was placed and the center hole K-wire was drilled checking the plate looked like it was in good position and good height and the distal 3 cortical screws were placed.  Third screw from the bottom was at 38 and sucked the plate down with the multidirectional that was exchanged for 34.  Proximal pegs were then placed and these were between 38 and 47 mm. The posterior-superior PEG hole had minimal bone purchase and was left empty.  All screws were tightened down and locked down, and final spot pictures were taking showing good position and alignment.  The patient tolerated the procedure well.  Hemovac was placed through separate stab incision using the trocar in-out technique superior lateral and  the subcutaneous tissue.  Reapproximation of subcutaneous tissue with 2-0 Vicryl, subcuticular skin closure, Marcaine infiltration.  Tincture of benzoin, Steri-Strips, postop dressing, and re-application of the sling was performed.  The patient tolerated the procedure well and transferred to the recovery room in stable condition.     Jhaden Pizzuto C. [name], M.D.     MCY/MEDQ  D:  04/07/2012  T:  04/07/2012  Job:  [id number]

## 2012-04-07 NOTE — Progress Notes (Signed)
Patient ID: Vanessa French, female   DOB: 04-Oct-1951, 60 y.o.   MRN: [id number] Pt to be discharged on 04/08/12 if stable after rounds.  AVF and RX on chart.  OV 1 week.

## 2012-04-07 NOTE — Discharge Instructions (Signed)
Wear sling at all times.  No motion of shoulder.  May move wrist and hand.  Keep wound covered and may change dressing daily or as needed.  Ice to shoulder as needed.  Keep wound dry until office visit

## 2012-04-07 NOTE — Brief Op Note (Cosign Needed)
04/07/2012  9:51 AM  PATIENT:  Vanessa French  61 y.o. female  PRE-OPERATIVE DIAGNOSIS:  Right Proximal Humerus Fracture  POST-OPERATIVE DIAGNOSIS:  Right Proximal Humerus Fracture  PROCEDURE:  Procedure(s) (LRB): OPEN REDUCTION INTERNAL FIXATION (ORIF) PROXIMAL HUMERUS FRACTURE (Right) STEROID INJECTION (Right) first dorsal compartment  SURGEON:  Surgeon(s) and Role:    * Eldred Manges, MD - Primary  PHYSICIAN ASSISTANT: Maud Deed [facility]  ASSISTANTS: none   ANESTHESIA:   local and general  EBL:  Total I/O In: 1400 [I.V.:1400] Out: 200 [Blood:200]  BLOOD ADMINISTERED:none  DRAINS: (one) Hemovact drain(s) in the right upper arm with  Suction Open   LOCAL MEDICATIONS USED:  MARCAINE     SPECIMEN:  No Specimen  DISPOSITION OF SPECIMEN:  N/A  COUNTS:  YES  TOURNIQUET:  * No tourniquets in log *  DICTATION: .Note written in EPIC  PLAN OF CARE: Admit for overnight observation  PATIENT DISPOSITION:  PACU - hemodynamically stable.   Delay start of Pharmacological VTE agent (>24hrs) due to surgical blood loss or risk of bleeding: yes

## 2012-04-07 NOTE — Anesthesia Preprocedure Evaluation (Addendum)
Anesthesia Evaluation  Patient identified by MRN, date of birth, ID band Patient awake    Reviewed: Allergy & Precautions, H&P , NPO status , Patient's Chart, lab work & pertinent test results  Airway  TM Distance: >3 FB Neck ROM: Full    Dental  (+) Dental Advisory Given   Pulmonary    Pulmonary exam normal       Cardiovascular     Neuro/Psych    GI/Hepatic GERD-  ,  Endo/Other    Renal/GU      Musculoskeletal   Abdominal (+) + obese,   Peds  Hematology   Anesthesia Other Findings   Reproductive/Obstetrics                          Anesthesia Physical Anesthesia Plan  ASA: II  Anesthesia Plan: General   Post-op Pain Management:    Induction: Intravenous  Airway Management Planned: Oral ETT  Additional Equipment:   Intra-op Plan:   Post-operative Plan: Extubation in OR  Informed Consent: I have reviewed the patients History and Physical, chart, labs and discussed the procedure including the risks, benefits and alternatives for the proposed anesthesia with the patient or authorized representative who has indicated his/her understanding and acceptance.   Dental advisory given  Plan Discussed with: Surgeon and CRNA  Anesthesia Plan Comments:        Anesthesia Quick Evaluation

## 2012-04-07 NOTE — Transfer of Care (Signed)
Immediate Anesthesia Transfer of Care Note  Patient: Vanessa French  Procedure(s) Performed: Procedure(s) (LRB): OPEN REDUCTION INTERNAL FIXATION (ORIF) PROXIMAL HUMERUS FRACTURE (Right) STEROID INJECTION (Right)  Patient Location: PACU  Anesthesia Type: GA combined with regional for post-op pain  Level of Consciousness: awake, alert  and oriented  Airway & Oxygen Therapy: Patient Spontanous Breathing and Patient connected to nasal cannula oxygen  Post-op Assessment: Report given to PACU RN, Post -op Vital signs reviewed and stable and Patient moving all extremities X 4  Post vital signs: Reviewed and stable  Complications: No apparent anesthesia complications

## 2012-04-07 NOTE — Interval H&P Note (Signed)
History and Physical Interval Note:  04/07/2012 7:31 AM  [name]  has presented today for surgery, with the diagnosis of Right Proximal Humerus Fracture  The various methods of treatment have been discussed with the patient and family. After consideration of risks, benefits and other options for treatment, the patient has consented to  Procedure(s) (LRB): OPEN REDUCTION INTERNAL FIXATION (ORIF) PROXIMAL HUMERUS FRACTURE (Right) STEROID INJECTION (Right) as a surgical intervention .  The patients' history has been reviewed, patient examined, no change in status, stable for surgery.  I have reviewed the patients' chart and labs.  Questions were answered to the patient's satisfaction.     Vanessa French

## 2012-04-08 NOTE — Progress Notes (Addendum)
Subjective: 1 Day Post-Op Procedure(s) (LRB): OPEN REDUCTION INTERNAL FIXATION (ORIF) PROXIMAL HUMERUS FRACTURE (Right) STEROID INJECTION (Right) Patient reports pain as 6 on 0-10 scale and mild.    Objective: Vital signs in last 24 hours: Temp:  [98 F (36.7 C)-98.9 F (37.2 C)] 98.1 F (36.7 C) (06/15 0654) Pulse Rate:  [58-87] 87  (06/15 0654) Resp:  [10-22] 20  (06/15 0654) BP: (118-132)/(60-87) 119/75 mmHg (06/15 0654) SpO2:  [94 %-100 %] 94 % (06/15 0654)  Intake/Output from previous day: 06/14 0701 - 06/15 0700 In: 3002.5 [P.O.:480; I.V.:2512.5] Out: 200 [Blood:200] Intake/Output this shift:     Basename 04/06/12 1239  HGB 14.3    Basename 04/06/12 1239  WBC 8.5  RBC 4.73  HCT 42.2  PLT 185    Basename 04/07/12 0618  NA 141  K 4.5  CL 103  CO2 29  BUN 12  CREATININE 0.53  GLUCOSE 109*  CALCIUM 9.5    Basename 04/07/12 0618  LABPT --  INR 1.10    Neurologically intact Neurovascular intact Sensation intact distally Intact pulses distally Incision: dressing C/D/I Drain removed from the right shoulder.  Assessment/Plan: 1 Day Post-Op Procedure(s) (LRB): OPEN REDUCTION INTERNAL FIXATION (ORIF) PROXIMAL HUMERUS FRACTURE (Right) STEROID INJECTION (Right)   Advance diet D/C IV fluids Discharge home with home health Discharge prescriptions are in the patient's chart to be given byRN  Chia Rock E 04/08/2012, 8:50 AM

## 2012-04-10 ENCOUNTER — Encounter (HOSPITAL_COMMUNITY): Payer: Self-pay | Admitting: Orthopaedic Surgery

## 2012-04-10 NOTE — Discharge Summary (Signed)
Physician Discharge Summary  Patient ID: Vanessa French MRN: [id number] DOB/AGE: [birth year] 60 y.o.  Admit date: 04/07/2012 Discharge date: 04/10/2012  Admission Diagnoses:  Closed fracture of right proximal humerus Right wrist first dorsal compartment tenosynovitis Discharge Diagnoses:  Principal Problem:  *Closed fracture of right proximal humerus right wrist first dorsal compartment tenosynovitis  Past Medical History  Diagnosis Date  . GERD (gastroesophageal reflux disease)   . Psoriasis   . Frequent UTI     Surgeries: Procedure(s): OPEN REDUCTION INTERNAL FIXATION (ORIF) PROXIMAL HUMERUS FRACTURE STEROID INJECTION on 04/07/2012 to the right first dorsal compartment of wrist   Consultants (if any):  none  Discharged Condition: Improved  Hospital Course: Vanessa French is an 61 y.o. female who was admitted 04/07/2012 with a diagnosis of Closed fracture of right proximal humerus and went to the operating room on 04/07/2012 and underwent the above named procedures.    She was given perioperative antibiotics:  Anti-infectives     Start     Dose/Rate Route Frequency Ordered Stop   04/07/12 1400   vancomycin (VANCOCIN) IVPB 1000 mg/200 mL premix        1,000 mg 200 mL/hr over 60 Minutes Intravenous Every 12 hours 04/07/12 1130 04/07/12 1403   04/07/12 0606   ceFAZolin (ANCEF) IVPB 2 g/50 mL premix        2 g 100 mL/hr over 30 Minutes Intravenous 60 min pre-op 04/07/12 0606 04/07/12 0746        .  She was given sequential compression devices, early ambulation.  She benefited maximally from the hospital stay and there were no complications.    Recent vital signs:  Filed Vitals:   04/08/12 0654  BP: 119/75  Pulse: 87  Temp: 98.1 F (36.7 C)  Resp: 20    Recent laboratory studies:  Lab Results  Component Value Date   HGB 14.3 04/06/2012   Lab Results  Component Value Date   WBC 8.5 04/06/2012   PLT 185 04/06/2012   Lab Results  Component Value Date   INR 1.10 04/07/2012   Lab Results  Component Value Date   NA 141 04/07/2012   K 4.5 04/07/2012   CL 103 04/07/2012   CO2 29 04/07/2012   BUN 12 04/07/2012   CREATININE 0.53 04/07/2012   GLUCOSE 109* 04/07/2012    Discharge Medications:   Medication List  As of 04/10/2012  3:53 PM   TAKE these medications         calcipotriene 0.005 % cream   Commonly known as: DOVONOX   Apply 1 application topically at bedtime.      calcium carbonate 750 MG chewable tablet   Commonly known as: TUMS EX   Chew 1 tablet by mouth daily.      fluticasone 0.05 % cream   Commonly known as: CUTIVATE   Apply 1 application topically 2 (two) times daily.      methocarbamol 500 MG tablet   Commonly known as: ROBAXIN   Take 1 tablet (500 mg total) by mouth 4 (four) times daily.      omeprazole 20 MG capsule   Commonly known as: PRILOSEC   Take 20 mg by mouth daily.      oxyCODONE-acetaminophen 5-325 MG per tablet   Commonly known as: PERCOCET   Take 1 tablet by mouth every 4 (four) hours as needed for pain.      oxyCODONE-acetaminophen 5-325 MG per tablet   Commonly known as: PERCOCET   Take 1 tablet  by mouth every 4 (four) hours as needed. For pain            Diagnostic Studies: Dg Chest 2 View  04/07/2012  *RADIOLOGY REPORT*  Clinical Data: Preoperative radiograph for right humeral surgery.  CHEST - 2 VIEW  Comparison: 12/29/2010  Findings: Heart size upper normal limits.  Mild aortic tortuosity. No focal consolidation.  No pleural effusion or pneumothorax. Multilevel degenerative changes of the visualized spine. Calcific density projecting inferior to the right humeral head is incompletely imaged, nonspecific.  IMPRESSION: No radiographic evidence of acute cardiopulmonary process.  Original Report Authenticated By: Waneta Martins, M.D.   Dg Humerus Right  04/07/2012  *RADIOLOGY REPORT*  Clinical Data: Right proximal humerus fracture.  RIGHT HUMERUS - 2+ VIEW,DG C-ARM 61-120 MIN  Comparison:  None.  Findings: Patient is status post open reduction and internal fixation of right proximal humerus fracture.  Hardware appears appropriately positioned without adverse features.  Humeral head is located.  IMPRESSION: As above.  Original Report Authenticated By: Elsie Stain, M.D.   Dg C-arm 61-120 Min  04/07/2012  *RADIOLOGY REPORT*  Clinical Data: Right proximal humerus fracture.  RIGHT HUMERUS - 2+ VIEW,DG C-ARM 61-120 MIN  Comparison: None.  Findings: Patient is status post open reduction and internal fixation of right proximal humerus fracture.  Hardware appears appropriately positioned without adverse features.  Humeral head is located.  IMPRESSION: As above.  Original Report Authenticated By: Elsie Stain, M.D.    Disposition: 01-Home or Self Care  Discharge Orders    Future Orders Please Complete By Expires   Diet - low sodium heart healthy      Diet - low sodium heart healthy      Call MD / Call 911      Comments:   If you experience chest pain or shortness of breath, CALL 911 and be transported to the hospital emergency room.  If you develope a fever above 101 F, pus (white drainage) or increased drainage or redness at the wound, or calf pain, call your surgeon's office.   Constipation Prevention      Comments:   Drink plenty of fluids.  Prune juice may be helpful.  You may use a stool softener, such as Colace (over the counter) 100 mg twice a day.  Use MiraLax (over the counter) for constipation as needed.   Increase activity slowly as tolerated      Driving restrictions      Comments:   No driving   Lifting restrictions      Comments:   No lifting with right arm   Call MD / Call 911      Comments:   If you experience chest pain or shortness of breath, CALL 911 and be transported to the hospital emergency room.  If you develope a fever above 101 F, pus (white drainage) or increased drainage or redness at the wound, or calf pain, call your surgeon's office.   Constipation  Prevention      Comments:   Drink plenty of fluids.  Prune juice may be helpful.  You may use a stool softener, such as Colace (over the counter) 100 mg twice a day.  Use MiraLax (over the counter) for constipation as needed.   Increase activity slowly as tolerated      Discharge instructions      Comments:   No lifting .Walk in house for first week them may start to get out slowly increasing distance up to one mile  by 3 weeks post op. Keep incision dry for 3 days, may use tegaderm or similar water impervious dressing. Keep dry dressing right shoulder. May remove sling briefly for exercises right elbow right wrist and hand. May use ice to the right shoulder as needed. Return appointment with Dr. [name] in 2 weeks. Please call if one has not been scheduled already. If any fever chills or increasing drainage call our office.   Lifting restrictions      Comments:   No lifting for 8 weeks   Driving restrictions      Comments:   No driving for 6 weeks      Follow-up Information    Follow up with Eldred Manges, MD. Schedule an appointment as soon as possible for a visit in 1 week.   Contact information:   [facility] [address] [place] [address] [phone]           Signed: Wende Neighbors 04/10/2012, 3:53 PM

## 2012-04-17 ENCOUNTER — Other Ambulatory Visit (HOSPITAL_COMMUNITY): Payer: Self-pay | Admitting: Orthopaedic Surgery

## 2012-04-17 DIAGNOSIS — Z8781 Personal history of (healed) traumatic fracture: Secondary | ICD-10-CM

## 2012-04-20 ENCOUNTER — Ambulatory Visit (HOSPITAL_COMMUNITY)
Admission: RE | Admit: 2012-04-20 | Discharge: 2012-04-20 | Disposition: A | Discharge status: Home / Self Care | Payer: BC Managed Care – PPO | Source: Ambulatory Visit | Attending: Orthopaedic Surgery | Admitting: Orthopaedic Surgery

## 2012-04-20 DIAGNOSIS — Z8781 Personal history of (healed) traumatic fracture: Secondary | ICD-10-CM

## 2012-04-20 DIAGNOSIS — Z13820 Encounter for screening for osteoporosis: Secondary | ICD-10-CM | POA: Insufficient documentation

## 2012-04-21 ENCOUNTER — Ambulatory Visit (HOSPITAL_BASED_OUTPATIENT_CLINIC_OR_DEPARTMENT_OTHER): Admission: RE | Admit: 2012-04-21 | Payer: BC Managed Care – PPO | Source: Ambulatory Visit | Admitting: Urology

## 2012-04-21 ENCOUNTER — Encounter (HOSPITAL_BASED_OUTPATIENT_CLINIC_OR_DEPARTMENT_OTHER): Admission: RE | Payer: Self-pay | Source: Ambulatory Visit

## 2012-04-21 SURGERY — COLPORRHAPHY, ANTERIOR, FOR CYSTOCELE REPAIR
Anesthesia: General

## 2012-10-24 ENCOUNTER — Ambulatory Visit (HOSPITAL_COMMUNITY)
Admission: RE | Admit: 2012-10-24 | Discharge: 2012-10-24 | Disposition: A | Discharge status: Home / Self Care | Payer: BC Managed Care – PPO | Source: Ambulatory Visit | Attending: Internal Medicine | Admitting: Internal Medicine

## 2012-10-24 ENCOUNTER — Other Ambulatory Visit (HOSPITAL_COMMUNITY): Payer: Self-pay | Admitting: Internal Medicine

## 2012-10-24 DIAGNOSIS — M545 Low back pain, unspecified: Secondary | ICD-10-CM | POA: Insufficient documentation

## 2012-10-24 DIAGNOSIS — M47814 Spondylosis without myelopathy or radiculopathy, thoracic region: Secondary | ICD-10-CM | POA: Insufficient documentation

## 2012-11-03 ENCOUNTER — Encounter (HOSPITAL_COMMUNITY): Payer: Self-pay | Admitting: Emergency Medicine

## 2012-11-03 ENCOUNTER — Emergency Department (HOSPITAL_COMMUNITY): Payer: BC Managed Care – PPO

## 2012-11-03 ENCOUNTER — Inpatient Hospital Stay (HOSPITAL_COMMUNITY)
Admission: EM | Admit: 2012-11-03 | Discharge: 2012-11-07 | DRG: 078 | Disposition: A | Discharge status: Home / Self Care | Payer: BC Managed Care – PPO | Attending: Pulmonary Disease | Admitting: Pulmonary Disease

## 2012-11-03 DIAGNOSIS — Z86711 Personal history of pulmonary embolism: Secondary | ICD-10-CM

## 2012-11-03 DIAGNOSIS — R739 Hyperglycemia, unspecified: Secondary | ICD-10-CM

## 2012-11-03 DIAGNOSIS — M545 Low back pain, unspecified: Secondary | ICD-10-CM | POA: Diagnosis present

## 2012-11-03 DIAGNOSIS — I517 Cardiomegaly: Secondary | ICD-10-CM

## 2012-11-03 DIAGNOSIS — G8929 Other chronic pain: Secondary | ICD-10-CM | POA: Diagnosis present

## 2012-11-03 DIAGNOSIS — I959 Hypotension, unspecified: Secondary | ICD-10-CM

## 2012-11-03 DIAGNOSIS — K219 Gastro-esophageal reflux disease without esophagitis: Secondary | ICD-10-CM | POA: Diagnosis present

## 2012-11-03 DIAGNOSIS — L408 Other psoriasis: Secondary | ICD-10-CM | POA: Diagnosis present

## 2012-11-03 DIAGNOSIS — I2699 Other pulmonary embolism without acute cor pulmonale: Secondary | ICD-10-CM

## 2012-11-03 DIAGNOSIS — R000 Tachycardia, unspecified: Secondary | ICD-10-CM

## 2012-11-03 DIAGNOSIS — F411 Generalized anxiety disorder: Secondary | ICD-10-CM | POA: Diagnosis present

## 2012-11-03 DIAGNOSIS — S42309D Unspecified fracture of shaft of humerus, unspecified arm, subsequent encounter for fracture with routine healing: Secondary | ICD-10-CM

## 2012-11-03 DIAGNOSIS — Z79899 Other long term (current) drug therapy: Secondary | ICD-10-CM

## 2012-11-03 DIAGNOSIS — R7309 Other abnormal glucose: Secondary | ICD-10-CM | POA: Diagnosis present

## 2012-11-03 DIAGNOSIS — F419 Anxiety disorder, unspecified: Secondary | ICD-10-CM

## 2012-11-03 DIAGNOSIS — S42201A Unspecified fracture of upper end of right humerus, initial encounter for closed fracture: Secondary | ICD-10-CM

## 2012-11-03 HISTORY — DX: Personal history of pulmonary embolism: Z86.711

## 2012-11-03 LAB — CBC WITH DIFFERENTIAL/PLATELET
Basophils Absolute: 0 10*3/uL (ref 0.0–0.1)
Eosinophils Relative: 2 % (ref 0–5)
HCT: 41.3 % (ref 36.0–46.0)
Lymphocytes Relative: 16 % (ref 12–46)
MCHC: 32 g/dL (ref 30.0–36.0)
MCV: 92 fL (ref 78.0–100.0)
Monocytes Absolute: 0.9 10*3/uL (ref 0.1–1.0)
RDW: 14.1 % (ref 11.5–15.5)
WBC: 13.1 10*3/uL — ABNORMAL HIGH (ref 4.0–10.5)

## 2012-11-03 LAB — HEPARIN LEVEL (UNFRACTIONATED): Heparin Unfractionated: 0.73 IU/mL — ABNORMAL HIGH (ref 0.30–0.70)

## 2012-11-03 LAB — POCT I-STAT TROPONIN I

## 2012-11-03 LAB — POCT I-STAT, CHEM 8
BUN: 15 mg/dL (ref 6–23)
Chloride: 104 mEq/L (ref 96–112)
Creatinine, Ser: 0.7 mg/dL (ref 0.50–1.10)
Potassium: 3.9 mEq/L (ref 3.5–5.1)
Sodium: 140 mEq/L (ref 135–145)
TCO2: 25 mmol/L (ref 0–100)

## 2012-11-03 LAB — GLUCOSE, CAPILLARY: Glucose-Capillary: 87 mg/dL (ref 70–99)

## 2012-11-03 LAB — MRSA PCR SCREENING: MRSA by PCR: NEGATIVE

## 2012-11-03 MED ORDER — WARFARIN - PHARMACIST DOSING INPATIENT: Freq: Every day | Status: DC

## 2012-11-03 MED ORDER — SODIUM CHLORIDE 0.9 % IV SOLN
Freq: Once | INTRAVENOUS | Status: AC
  Administered 2012-11-03: 01:00:00 via INTRAVENOUS

## 2012-11-03 MED ORDER — LORAZEPAM 0.5 MG PO TABS
0.5000 mg | ORAL_TABLET | Freq: Four times a day (QID) | ORAL | Status: DC | PRN
  Administered 2012-11-03: 1 mg via ORAL
  Administered 2012-11-03: 0.5 mg via ORAL
  Filled 2012-11-03 (×2): qty 1

## 2012-11-03 MED ORDER — HEPARIN (PORCINE) IN NACL 100-0.45 UNIT/ML-% IJ SOLN: 12.0000 [IU]/kg/h | INTRAMUSCULAR | Status: DC

## 2012-11-03 MED ORDER — HEPARIN (PORCINE) IN NACL 100-0.45 UNIT/ML-% IJ SOLN
1300.0000 [IU]/h | INTRAMUSCULAR | Status: DC
  Administered 2012-11-03: 1350 [IU]/h via INTRAVENOUS
  Administered 2012-11-03 – 2012-11-06 (×5): 1300 [IU]/h via INTRAVENOUS
  Filled 2012-11-03 (×12): qty 250

## 2012-11-03 MED ORDER — SODIUM CHLORIDE 0.9 % IV SOLN
250.0000 mL | INTRAVENOUS | Status: DC | PRN
  Administered 2012-11-03: 06:00:00 via INTRAVENOUS

## 2012-11-03 MED ORDER — WARFARIN SODIUM 7.5 MG PO TABS
7.5000 mg | ORAL_TABLET | Freq: Once | ORAL | Status: AC
  Administered 2012-11-03: 7.5 mg via ORAL
  Filled 2012-11-03: qty 1

## 2012-11-03 MED ORDER — TRAMADOL HCL 50 MG PO TABS
50.0000 mg | ORAL_TABLET | Freq: Four times a day (QID) | ORAL | Status: DC | PRN
  Administered 2012-11-03 – 2012-11-06 (×5): 50 mg via ORAL
  Filled 2012-11-03 (×5): qty 1

## 2012-11-03 MED ORDER — FENTANYL CITRATE 0.05 MG/ML IJ SOLN
50.0000 ug | INTRAMUSCULAR | Status: DC | PRN
  Administered 2012-11-03 (×2): 50 ug via INTRAVENOUS
  Filled 2012-11-03 (×2): qty 2

## 2012-11-03 MED ORDER — HEPARIN BOLUS VIA INFUSION
5000.0000 [IU] | Freq: Once | INTRAVENOUS | Status: AC
  Administered 2012-11-03: 5000 [IU] via INTRAVENOUS

## 2012-11-03 MED ORDER — IOHEXOL 350 MG/ML SOLN
100.0000 mL | Freq: Once | INTRAVENOUS | Status: AC | PRN
  Administered 2012-11-03: 100 mL via INTRAVENOUS

## 2012-11-03 NOTE — Progress Notes (Signed)
Name: Vanessa French MRN: [id number] DOB: 09/22/1951 LOS: 0  PCCM RESIDENT DAILY PROGRESS NOTE  History of Present Illness: This is a 62 y/o female with minimal past medical history who was admitted on 11/03/2012 from the [facility] ED with a large bilateral pulmonary embolism in the setting of immobility after a fall and closed R humerus fracture approximately two weeks prior to admission. The humerus fracture has been managed conservatively so far with a sling. She states that after her fall she was immobile at home and did very little walking. On 1/09 she noticed increasing left leg swelling and pain. While walking up a flight of stairs she became light headed and then had what sounds like syncope in her bedroom associated with some chest tightness and shortness of breath. In the ED she was found to have a large bilateral pulmonary embolism. PCCM was consulted due to tachycardia and slight hypotension.   Lines / Drains: none  Cultures: None  Antibiotics: none  Tests / Events: 1/10 admitted after CT angio showed bilateral PE.  Tachycardic and hypoxic but >92% on [place].  BP soft but stable.   Overnight Events:   Did well overnight.  Remains tachycardic but BPs holding steady.  Mild chest heaviness today with some sharp localized pain with deep inhalation.   Vital Signs: Temp:  [97.8 F (36.6 C)] 97.8 F (36.6 C) (01/10 0600) Pulse Rate:  [113-137] 118  (01/10 0700) Resp:  [15-22] 15  (01/10 0700) BP: (93-114)/(22-87) 114/76 mmHg (01/10 0700) SpO2:  [89 %-100 %] 98 % (01/10 0700) Weight:  [200 lb (90.719 kg)-208 lb 5.4 oz (94.5 kg)] 208 lb 5.4 oz (94.5 kg) (01/10 0600) I/O last 3 completed shifts: In: 1283.7 [P.O.:240; I.V.:1043.7] Out: -   Physical Examination: Vitals reviewed. General: resting in bed, NAD HEENT: PERRL, EOMI, no scleral icterus Cardiac: tachycardic but regular rhythm, no rubs, murmurs or gallops Pulm: mild bibasilar inspiratory crackles.  no wheezes, or rhonchi Abd:  soft, nontender, nondistended, BS present Ext: warm and well perfused, Left leg with 2+ pitting edema, right trace edema. Neuro: alert and oriented X3, cranial nerves II-XII grossly intact, strength and sensation to light touch equal in bilateral upper and lower extremities  Ventilator settings:    [facility] 11/03/12 0044  PHART --  PO2ART --  TCO2 25  HCO3 --   Labs and Imaging:   Basic Metabolic Panel:  Lab 11/03/12 5784  NA 140  K 3.9  CL 104  CO2 --  GLUCOSE 158*  BUN 15  CREATININE 0.70  CALCIUM --  MG --  PHOS --   CBC:  Lab 11/03/12 0044 11/03/12 0020  WBC -- 13.1*  NEUTROABS -- 9.8*  HGB 15.0 13.2  HCT 44.0 41.3  MCV -- 92.0  PLT -- 169   Coagulation:  Lab 11/03/12 0020  LABPROT 14.7  INR 1.17   Assessment and Plan: PULMONARY  ASSESSMENT: 1. Hypoxia secondary to pulmonary embolism: currently stable on [place].    PLAN:   - supplemental oxygen to maintain sats >90%  CARDIOVASCULAR  ASSESSMENT:  1. Bilateral large pulmonary embolism:  Likely provoked by immobility from recent humerus fracture.  Currently hemodynamically stable but with tachycardia.  Heparin started.  Discussed oral anticoagulants today and she states that with her insurance plan [place] [employer] would likely be 100 dollars per month which would be prohibitive so she likely would go with coumadin.  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  PLAN:  - continue Heparin - start coumadin per pharmacy today, likely need 3-6 months - F/U lower extremity doppler and TTE  RENAL  ASSESSMENT:  none PLAN:   Monitor renal function  GASTROINTESTINAL  ASSESSMENT:  none PLAN:   Advance diet  HEMATOLOGIC  ASSESSMENT:  Provoked PE PLAN:  As above  INFECTIOUS  ASSESSMENT:  none PLAN:   none  ENDOCRINE  ASSESSMENT:  hyperglycemia   PLAN:   Monitor glucose, consider SSI  NEUROLOGIC  ASSESSMENT:   1. Anxiety 2. Chronic back pain  PLAN:     - continue PRN ativan for anxiety - Tramadol for back pain.  CLINICAL SUMMARY: 62 year old woman presents with acute SOB and swollen left leg, found to have bilateral PE likely from immobilization from recent humerus fracture.  Currently hemodynamically stable.  Will continue heparin today and start coumadin per pharmacy today.   Best practices / Disposition: -->ICU status under PCCM -->full code -->Heparin for DVT Px -->Protonix for GI Px -->diet -->family updated at bedside  PRIBULA,CHRISTOPHER 11/03/2012, 8:44 AM  The patient is critically ill with multiple organ systems failure and requires high complexity decision making for assessment and support, frequent evaluation and titration of therapies, application of advanced monitoring technologies and extensive interpretation of multiple databases. Critical Care Time devoted to patient care services described in this note is   minutes.   I have interviewed and examined the patient and reviewed the database. I have formulated the assessment and plan as reflected in the note above with amendments made by me.   Billy Fischer, MD;  PCCM service; Mobile [phone]

## 2012-11-03 NOTE — ED Provider Notes (Signed)
History     CSN: [id number]  Arrival date & time 11/03/12  0003   First MD Initiated Contact with Patient 11/03/12 0012      Chief Complaint  Patient presents with  . Shortness of Breath    (Consider location/radiation/quality/duration/timing/severity/associated sxs/prior treatment) HPI Comments: 62 year old female with a history of recent left humerus fracture, prior right humerus fracture 6 months ago which required an open reduction internal fixation and swelling of her left lower extremity. She presents with acute onset of shortness of breath and near syncope associated with left leg tightness and swelling. The symptoms are persistent, they were acute in onset, they are severe and nothing seems to make it better or worse. She denies chest pain, headache, numbness, weakness, nausea or vomiting. She has never had a pulmonary embolism, has never had cancer, has not been traveling though she has been relatively immobile after her fracture in the last 2 weeks. She has been in a sling for her left upper extremity. Her symptoms started this evening while she was ascending a staircase. Paramedics transported the patient noted her to be tachycardic and requiring oxygen with oxygen saturations in the 87-89% range. Patient has no history of prior pulmonary disease, cardiac disease or significant infectious history. She has never had a pulmonary embolus  Patient is a 62 y.o. female presenting with shortness of breath. The history is provided by the patient and the spouse.  Shortness of Breath  Associated symptoms include shortness of breath.    Past Medical History  Diagnosis Date  . GERD (gastroesophageal reflux disease)   . Psoriasis   . Frequent UTI     Past Surgical History  Procedure Date  . Tonsillectomy   . Appendectomy   . Abdominal hysterectomy   . Bladder suspension   . Ureter revision     COMPLIATION OF BLADDER SLING  . Extensor tendon of forearm / wrist repair     LEFT ARM    . Orif humerus fracture 04/07/2012  . Orif humerus fracture 04/07/2012    Procedure: OPEN REDUCTION INTERNAL FIXATION (ORIF) PROXIMAL HUMERUS FRACTURE;  Surgeon: Eldred Manges, MD;  Location: [facility] OR;  Service: Orthopedics;  Laterality: Right;  Open Reduction Internal Fixation Right Proxmial Humerus  . Steriod injection 04/07/2012    Procedure: STEROID INJECTION;  Surgeon: Eldred Manges, MD;  [facility];  Service: Orthopedics;  Laterality: Right;   Right 1st Dorsal Compartment Injection    No family history on file.  History  Substance Use Topics  . Smoking status: Never Smoker   . Smokeless tobacco: Never Used  . Alcohol Use: No    OB History    Grav Para Term Preterm Abortions TAB SAB Ect Mult Living                  Review of Systems  Respiratory: Positive for shortness of breath.   All other systems reviewed and are negative.    Allergies  Penicillins and Latex  Home Medications   Current Outpatient Rx  Name  Route  Sig  Dispense  Refill  . ALENDRONATE SODIUM 70 MG PO TABS   Oral   Take 70 mg by mouth every 7 (seven) days. On Wednesdays  Hold while in the hospital  Take with a full glass of water on an empty stomach.         Marland Kitchen CALCIPOTRIENE 0.005 % EX CREA   Topical   Apply 1 application topically at bedtime.         Marland Kitchen  CALCIUM CARBONATE ANTACID 750 MG PO CHEW   Oral   Chew 1 tablet by mouth daily.         Marland Kitchen FLUTICASONE PROPIONATE 0.05 % EX CREA   Topical   Apply 1 application topically 2 (two) times daily.         Marland Kitchen FOLIC ACID PO   Oral   Take 1 tablet by mouth daily.         Marland Kitchen METHOTREXATE SODIUM 10 MG PO TABS   Oral   Take 10 mg by mouth once a week. On Fridays   Caution: Chemotherapy. Protect from light.         . OMEPRAZOLE 20 MG PO CPDR   Oral   Take 20 mg by mouth daily.         Marland Kitchen SOLIFENACIN SUCCINATE 5 MG PO TABS   Oral   Take 10 mg by mouth daily.         . TRAMADOL HCL 50 MG PO TABS   Oral   Take 50 mg by mouth every 6  (six) hours as needed. For pain           BP 100/87  Pulse 132  Resp 18  Ht 5\' 6"  (1.676 m)  Wt 200 lb (90.719 kg)  BMI 32.28 kg/m2  SpO2 98%  Physical Exam  Nursing note and vitals reviewed. Constitutional: She appears well-developed and well-nourished. No distress.  HENT:  Head: Normocephalic and atraumatic.  Mouth/Throat: Oropharynx is clear and moist. No oropharyngeal exudate.  Eyes: Conjunctivae normal and EOM are normal. Pupils are equal, round, and reactive to light. Right eye exhibits no discharge. Left eye exhibits no discharge. No scleral icterus.  Neck: Normal range of motion. Neck supple. No JVD present. No thyromegaly present.  Cardiovascular: Regular rhythm, normal heart sounds and intact distal pulses.  Exam reveals no gallop and no friction rub.   No murmur heard.      Tachycardic, bilateral radial pulses are strong  Pulmonary/Chest: Breath sounds normal. She is in respiratory distress. She has no wheezes. She has no rales.       Tachypnea and increased work of breathing, oxygen saturation 87% on room air  Abdominal: Soft. Bowel sounds are normal. She exhibits no distension and no mass. There is no tenderness.  Musculoskeletal: Normal range of motion. She exhibits edema ( Left lower extremity with asymmetry compared to right lower extremity, left lower extremity is larger and mildly edematous.). She exhibits no tenderness.       Left upper extremity in a sling, tenderness to the proximal humerus  Lymphadenopathy:    She has no cervical adenopathy.  Neurological: She is alert. Coordination normal.  Skin: Skin is warm and dry. No rash noted. No erythema.  Psychiatric: She has a normal mood and affect. Her behavior is normal.    ED Course  Procedures (including critical care time)  Labs Reviewed  CBC WITH DIFFERENTIAL - Abnormal; Notable for the following:    WBC 13.1 (*)     Neutro Abs 9.8 (*)     All other components within normal limits  POCT I-STAT, CHEM  8 - Abnormal; Notable for the following:    Glucose, Bld 158 (*)     All other components within normal limits  POCT I-STAT TROPONIN I - Abnormal; Notable for the following:    Troponin i, poc 0.11 (*)     All other components within normal limits  APTT  PROTIME-INR   Ct Angio  Chest Pe W/cm &/or Wo Cm  11/03/2012  *RADIOLOGY REPORT*  Clinical Data: Near-syncope.  History of recent shoulder fracture.  CT ANGIOGRAPHY CHEST  Technique:  Multidetector CT imaging of the chest using the standard protocol during bolus administration of intravenous contrast. Multiplanar reconstructed images including MIPs were obtained and reviewed to evaluate the vascular anatomy.  Contrast:  100 ml Omnipaque-300.  Comparison: Plain film of the chest earlier this same date.  Findings: The study is positive for pulmonary embolus.  Clot is seen bilaterally including in the distal right main pulmonary artery and descending interlobar arteries on the left.  Clot in the pulmonary arterial branch of the anterior left upper lobe is also seen.  Heart size is upper normal.  There is no pleural or pericardial effusion.  No axillary, hilar or mediastinal lymphadenopathy is identified.  Bones demonstrate a calcified granuloma in the right upper lobe.  A 0.4 cm nodule is seen along the minor fissure on image 46.  There is some dependent atelectatic change.  The lungs are otherwise unremarkable.  Incidentally imaged upper abdomen demonstrates two small low attenuating lesions in the liver likely cysts.  Visualized intra- abdominal contents are otherwise unremarkable.  There is no focal bony abnormality.  IMPRESSION:  1.  Study is positive for pulmonary embolus. 2.  0.4 cm nodule along the minor fissure is likely a subpleural lymph node. If the patient is at high risk for bronchogenic carcinoma, follow-up chest CT at 1 year is recommended.  If the patient is at low risk, no follow-up is needed.  This recommendation follows the consensus  statement: Guidelines for Management of Small Pulmonary Nodules Detected on CT Scans:  A Statement from the Fleischner Society as published in Radiology 2005; 237:395-400.  Critical Value/emergent results were called by telephone at the time of interpretation on 11/03/2012 at 380 to Dr. Hyacinth Meeker, who verbally acknowledged these results.   Original Report Authenticated By: Holley Dexter, M.D.    Dg Chest Port 1 View  11/03/2012  *RADIOLOGY REPORT*  Clinical Data: Complains of  feeling faint.  PORTABLE CHEST - 1 VIEW  Comparison: None.  Findings: Low lung volumes.  Normal cardiac and mediastinal silhouette.  No infiltrates or failure.  No effusion or pneumothorax.  Recent surgical reduction of a proximal right humerus fracture.  IMPRESSION: Low lung volumes.  No active infiltrates.   Original Report Authenticated By: Davonna Belling, M.D.      1. Bilateral pulmonary embolism   2. Hyperglycemia       MDM  EKG shows a sinus tachycardia, no obvious signs of right heart strain, but the patient is high risk for pulmonary embolism.  Cardiac monitoring, oxygen supplementation, labs, chest x-ray, coagulation panel pending likely heparin administration of pulmonary embolism present. CT angiogram ordered.  I ordered heparin immediately before the CT scan was completed secondary to high likelihood of pulmonary embolism. The patient is critically ill  ED ECG REPORT  I personally interpreted this EKG   Date: 11/03/2012   Rate: 133  Rhythm: sinus tachycardia  QRS Axis: normal  Intervals: normal  ST/T Wave abnormalities: normal  Conduction Disutrbances:none  Narrative Interpretation:   Old EKG Reviewed: Compared with 04/07/2012, rate now tachycardic, S1 Q3 3 T3 now present.   I have discussed the care with the hospitalist as well as the pulmonary critical care intensivist. The patient is persistently tachycardic and hypotensive. After discussion with the intensivist decision was made not to use  thrombolytics but to continue heparin therapy with  IV fluids. They will come to evaluate the patient for possible intensive care unit placement.  CRITICAL CARE Performed by: Vida Roller   Total critical care time: 35  Critical care time was exclusive of separately billable procedures and treating other patients.  Critical care was necessary to treat or prevent imminent or life-threatening deterioration.  Critical care was time spent personally by me on the following activities: development of treatment plan with patient and/or surrogate as well as nursing, discussions with consultants, evaluation of patient's response to treatment, examination of patient, obtaining history from patient or surrogate, ordering and performing treatments and interventions, ordering and review of laboratory studies, ordering and review of radiographic studies, pulse oximetry and re-evaluation of patient's condition.       Vida Roller, MD 11/03/12 [phone]

## 2012-11-03 NOTE — ED Notes (Signed)
Patient transported to CT 

## 2012-11-03 NOTE — Progress Notes (Signed)
ANTICOAGULATION CONSULT NOTE - Initial Consult  Pharmacy Consult for Heparin/coumadin Indication: bilateral PE  Allergies  Allergen Reactions  . Penicillins Other (See Comments)    Yeast infection  . Latex Rash    Patient Measurements: Height: 5\' 6"  (167.6 cm) Weight: 208 lb 5.4 oz (94.5 kg) IBW/kg (Calculated) : 59.3  Heparin Dosing Weight: 80 kg   Vital Signs: Temp: 98.3 F (36.8 C) (01/10 0800) Temp src: Oral (01/10 0800) BP: 109/86 mmHg (01/10 0900) Pulse Rate: 110  (01/10 0900)  Labs:  Basename 11/03/12 1138 11/03/12 0044 11/03/12 0020  HGB -- 15.0 13.2  HCT -- 44.0 41.3  PLT -- -- 169  APTT -- -- 31  LABPROT -- -- 14.7  INR -- -- 1.17  HEPARINUNFRC 0.73* -- --  CREATININE -- 0.70 --  CKTOTAL -- -- --  CKMB -- -- --  TROPONINI -- -- --    Estimated Creatinine Clearance: 86.7 ml/min (by C-G formula based on Cr of 0.7).   Medical History: Past Medical History  Diagnosis Date  . GERD (gastroesophageal reflux disease)   . Psoriasis   . Frequent UTI    s/p R humerus fracture approx 3 weeks ago  Medications:  Fosamax  Folic Acid  Methotrexate  Prilosec  Vesicare  Ultram  Assessment: 62 yo female with bilateral PE likely from immobilization from recent humerus fracture. Currently hemodynamically stable. On heparin bridge to coumadin D#1, heparin level slightly supratherapeutic on 1350 units/hr. Cbc stable, no bleeding noted. Baseline PT/INR 1.17  Goal of Therapy:  Heparin level 0.3-0.7 units/ml Monitor platelets by anticoagulation protocol: Yes   Plan:  Decrease heparin rate slightly to 1300 units/hr Coumadin 7.5mg  PO x 1 6 hr heparin level at 1930 Heparin level, cbc and PT/INR daily Coumadin book and video Coumadin education with Pharmacist  Bayard Hugger, PharmD, BCPS  Clinical Pharmacist  Pager: [phone]  11/03/2012,1:08 PM

## 2012-11-03 NOTE — H&P (Addendum)
PULMONARY  / CRITICAL CARE MEDICINE  Name: Vanessa French MRN: [id number] DOB: Mar 13, 1951    LOS: 0  REFERRING MD :  Hyacinth Meeker  CHIEF COMPLAINT:  Pulmonary embolism  BRIEF PATIENT DESCRIPTION: This is a 62 y/o female with minimal past medical history who was admitted on 11/03/2012 from the [facility] ED with a large bilateral pulmonary embolism in the setting of immobility after a fall and closed R humerus fracture approximately two weeks prior to admission.    LINES / TUBES: none  CULTURES: none  ANTIBIOTICS: none  SIGNIFICANT EVENTS:    LEVEL OF CARE:  ICU PRIMARY SERVICE:  PCCM CONSULTANTS:   CODE STATUS: Full DIET:  npo DVT Px:  Hep gtt GI Px:  n/a  HISTORY OF PRESENT ILLNESS:  This is a 62 y/o female with minimal past medical history who was admitted on 11/03/2012 from the [facility] ED with a large bilateral pulmonary embolism in the setting of immobility after a fall and closed R humerus fracture approximately two weeks prior to admission.  The humerus fracture has been managed conservatively so far with a sling.  She states that after her fall she was immobile at home and did very little walking.  On 1/09 she noticed increasing left leg swelling and pain.  While walking up a flight of stairs she became light headed and then had what sounds like syncope in her bedroom associated with some chest tightness and shortness of breath.  In the ED she was found to have a large bilateral pulmonary embolism.  PCCM was consulted due to tachycardia and slight hypotension.     PAST MEDICAL HISTORY :  Past Medical History  Diagnosis Date  . GERD (gastroesophageal reflux disease)   . Psoriasis   . Frequent UTI    Past Surgical History  Procedure Date  . Tonsillectomy   . Appendectomy   . Abdominal hysterectomy   . Bladder suspension   . Ureter revision     COMPLIATION OF BLADDER SLING  . Extensor tendon of forearm / wrist repair     LEFT ARM  . Orif humerus fracture 04/07/2012  . Orif  humerus fracture 04/07/2012    Procedure: OPEN REDUCTION INTERNAL FIXATION (ORIF) PROXIMAL HUMERUS FRACTURE;  Surgeon: Eldred Manges, MD;  Location: [facility] OR;  Service: Orthopedics;  Laterality: Right;  Open Reduction Internal Fixation Right Proxmial Humerus  . Steriod injection 04/07/2012    Procedure: STEROID INJECTION;  Surgeon: Eldred Manges, MD;  Location: [facility] OR;  Service: Orthopedics;  Laterality: Right;   Right 1st Dorsal Compartment Injection   Prior to Admission medications   Medication Sig Start Date End Date Taking? Authorizing Provider  alendronate (FOSAMAX) 70 MG tablet Take 70 mg by mouth every 7 (seven) days. On Wednesdays  Hold while in the hospital  Take with a full glass of water on an empty stomach.   Yes Historical Provider, MD  calcipotriene (DOVONOX) 0.005 % cream Apply 1 application topically at bedtime.   Yes Historical Provider, MD  calcium carbonate (TUMS EX) 750 MG chewable tablet Chew 1 tablet by mouth daily.   Yes Historical Provider, MD  fluticasone (CUTIVATE) 0.05 % cream Apply 1 application topically 2 (two) times daily.   Yes Historical Provider, MD  FOLIC ACID PO Take 1 tablet by mouth daily.   Yes Historical Provider, MD  methotrexate (RHEUMATREX) 10 MG tablet Take 10 mg by mouth once a week. On Fridays   Caution: Chemotherapy. Protect from light.   Yes  Historical Provider, MD  omeprazole (PRILOSEC) 20 MG capsule Take 20 mg by mouth daily.   Yes Historical Provider, MD  solifenacin (VESICARE) 5 MG tablet Take 10 mg by mouth daily.   Yes Historical Provider, MD  traMADol (ULTRAM) 50 MG tablet Take 50 mg by mouth every 6 (six) hours as needed. For pain   Yes Historical Provider, MD   Allergies  Allergen Reactions  . Penicillins Other (See Comments)    Yeast infection  . Latex Rash    FAMILY HISTORY:  No family history on file. SOCIAL HISTORY:  reports that she has never smoked. She has never used smokeless tobacco. She reports that she does not drink alcohol or  use illicit drugs.  REVIEW OF SYSTEMS:   Gen: Denies fever, chills, weight change, fatigue, night sweats HEENT: Denies blurred vision, double vision, hearing loss, tinnitus, sinus congestion, rhinorrhea, sore throat, neck stiffness, dysphagia PULM: per hpi CV: per hpi GI: Denies abdominal pain, nausea, vomiting, diarrhea, hematochezia, melena, constipation, change in bowel habits GU: Denies dysuria, hematuria, polyuria, oliguria, urethral discharge Endocrine: Denies hot or cold intolerance, polyuria, polyphagia or appetite change Derm: Denies rash, dry skin, scaling or peeling skin change Heme: Denies easy bruising, bleeding, bleeding gums Neuro: Denies headache, numbness, weakness, slurred speech, loss of memory or consciousness    INTERVAL HISTORY:   VITAL SIGNS: Pulse Rate:  [113-137] 137  (01/10 0400) Resp:  [17-22] 18  (01/10 0400) BP: (93-107)/(61-87) 107/78 mmHg (01/10 0400) SpO2:  [89 %-100 %] 99 % (01/10 0400) Weight:  [90.719 kg (200 lb)] 90.719 kg (200 lb) (01/10 0100) HEMODYNAMICS:   VENTILATOR SETTINGS:   INTAKE / OUTPUT: Intake/Output    None     PHYSICAL EXAMINATION: Gen: anxious, otherwise breathing comfortably HEENT: NCAT, PERRL, OP clear, neck supple PULM: CTA B CV: Tachycardic, regular, no mgr, no JVD AB: BS+, soft, nontender, no hsm Ext: warm, edema in L leg > R, no clubbing, no cyanosis Derm: no rash or skin breakdown Neuro: A&Ox4, CN II-XII intact, strength 5/5 in all 4 extremities    LABS: Cbc  Lab 11/03/12 0044 11/03/12 0020  WBC -- 13.1*  HGB 15.0 13.2  HCT 44.0 41.3  PLT -- 169    Chemistry   Lab 11/03/12 0044  NA 140  K 3.9  CL 104  CO2 --  BUN 15  CREATININE 0.70  CALCIUM --  MG --  PHOS --  GLUCOSE 158*    Liver fxn No results found for this basename: AST:3,ALT:3,ALKPHOS:3,BILITOT:3,PROT:3,ALBUMIN:3 in the last 168 hours coags  Lab 11/03/12 0020  APTT 31  INR 1.17   Sepsis markers No results found for this  basename: LATICACIDVEN:3,PROCALCITON:3 in the last 168 hours Cardiac markers No results found for this basename: CKTOTAL:3,CKMB:3,TROPONINI:3 in the last 168 hours BNP No results found for this basename: PROBNP:3 in the last 168 hours ABG  Lab 11/03/12 0044  PHART --  PCO2ART --  PO2ART --  HCO3 --  TCO2 25    CBG trend No results found for this basename: GLUCAP:5 in the last 168 hours  IMAGING:  ECG:  DIAGNOSES: Principal Problem:  *Acute pulmonary embolism Active Problems:  Closed fracture of right proximal humerus  Anxiety  Hypotension  Tachycardia   ASSESSMENT / PLAN:    CARDIOVASCULAR  ASSESSMENT:  1) Large pulmonary embolism; Provoked by immobility She is hemodynamically stable but with tachycardia she likely has RV strain.  She needs heparin and consideration should be given to an IVC filter if  there is residual clot on her LE doppler which will be performed later this morning.   There is no clear indication for thrombolytics right now.   She would prefer to not receive thrombolytics because she had a family friend die from bleeding after receiving them for a similar indication.   PLAN:  -admit to the ICU for close monitoring of blood pressure -heparin gtt  -transition to xarelto vs warfarin in next 2-3 days -f/u LE doppler and TTE 1/10 -consider IVC filter if residual clot and RV strain -thrombolytics if shock -would treat for 3-6 months given provoked PE  PULMONARY  ASSESSMENT: 1) Hypoxemia due to pulmonary embolism PLAN:   -supplemental O2 as needed to maintain O2 saturation > 88%  NEUROLOGIC  ASSESSMENT:   1) Anxiety 2) Chronic back pain PLAN:   -ativan now and prn -tramadol now and prn  RENAL  ASSESSMENT:   1) No acute issues PLAN:   -monitor renal function  GASTROINTESTINAL  ASSESSMENT:   1) No acute issues PLAN:   -advance diet later 1/10  HEMATOLOGIC  ASSESSMENT:   1) Provoked PE PLAN:  -see  above  INFECTIOUS  ASSESSMENT:   1) no acute issues PLAN:   -follow temp curve, wbc  ENDOCRINE  ASSESSMENT:   1) Hyperglycemia PLAN:   -monitor glucose, start SSI if multiple glucoses elevated   CLINICAL SUMMARY: 62 y/o female with a large pulmonary embolism, likely RV strain, borderline but stable blood pressure.  Treating with heparin gtt at this point, will follow up LE doppler and echocardiogram on 1/10.  I have personally obtained a history, examined the patient, evaluated laboratory and imaging results, formulated the assessment and plan and placed orders. CRITICAL CARE: The patient is critically ill with multiple organ systems failure and requires high complexity decision making for assessment and support, frequent evaluation and titration of therapies, application of advanced monitoring technologies and extensive interpretation of multiple databases. Critical Care Time devoted to patient care services described in this note is 45 minutes.   Max Fickle, MD Pulmonary and Critical Care Medicine [facility] Pager: [phone]  11/03/2012, 4:34 AM

## 2012-11-03 NOTE — Progress Notes (Signed)
ANTICOAGULATION CONSULT NOTE   Pharmacy Consult for Heparin Indication: bilateral PE  Allergies  Allergen Reactions  . Penicillins Other (See Comments)    Yeast infection  . Latex Rash    Patient Measurements: Height: 5\' 6"  (167.6 cm) Weight: 208 lb 5.4 oz (94.5 kg) IBW/kg (Calculated) : 59.3  Heparin Dosing Weight: 80 kg   Vital Signs: Temp: 99.1 F (37.3 C) (01/10 2001) Temp src: Oral (01/10 2001) BP: 111/67 mmHg (01/10 2001) Pulse Rate: 111  (01/10 2001)  Labs:  Basename 11/03/12 1955 11/03/12 1138 11/03/12 0044 11/03/12 0020  HGB -- -- 15.0 13.2  HCT -- -- 44.0 41.3  PLT -- -- -- 169  APTT -- -- -- 31  LABPROT -- -- -- 14.7  INR -- -- -- 1.17  HEPARINUNFRC 0.53 0.73* -- --  CREATININE -- -- 0.70 --  CKTOTAL -- -- -- --  CKMB -- -- -- --  TROPONINI -- -- -- --    Estimated Creatinine Clearance: 86.7 ml/min (by C-G formula based on Cr of 0.7).   Medical History: Past Medical History  Diagnosis Date  . GERD (gastroesophageal reflux disease)   . Psoriasis   . Frequent UTI    s/p R humerus fracture approx 3 weeks ago  Medications:  Fosamax  Folic Acid  Methotrexate  Prilosec  Vesicare  Ultram  Assessment: 62 yo female with bilateral PE likely from immobilization from recent humerus fracture. Currently hemodynamically stable. Heparin level therapeutic.  Goal of Therapy:  Heparin level 0.3-0.7 units/ml Monitor platelets by anticoagulation protocol: Yes   Plan:  Continue heparin at 1300 units/hr F/u am labs  Talbert Cage, PharmD Clinical Pharmacist  Pager: [phone]  11/03/2012,9:02 PM

## 2012-11-03 NOTE — Progress Notes (Signed)
Report called to [facility]; pt will transfer to [id number]; family at bedside and aware

## 2012-11-03 NOTE — ED Notes (Signed)
Troponin results given to Dr. Hyacinth Meeker by B. Bing Plume EMT

## 2012-11-03 NOTE — ED Notes (Signed)
EMS called to house for  Pt. C/o feeling faint. States she did not lose consciousness. Pt. Has new swelling noted in her left leg. She recently broke her left shoulder on 10/19/12. Upon assessment Pt. Had increased respirations and feeling "tired" states that it started tonight around 2300.

## 2012-11-03 NOTE — Progress Notes (Signed)
Echocardiogram 2D Echocardiogram has been performed.  Vanessa French 11/03/2012, 10:35 AM

## 2012-11-03 NOTE — Progress Notes (Signed)
Pt received from ED alert and oriented, denies pain @ present. Initial assessment done with cardiac monitor on. Database completed. Call light within reach, will cont to monitor closely.

## 2012-11-03 NOTE — Progress Notes (Signed)
ANTICOAGULATION CONSULT NOTE - Initial Consult  Pharmacy Consult for Heparin Indication: bilateral PE  Allergies  Allergen Reactions  . Penicillins Other (See Comments)    Yeast infection  . Latex Rash    Patient Measurements: Height: 5\' 6"  (167.6 cm) Weight: 200 lb (90.719 kg) (Per patient report) IBW/kg (Calculated) : 59.3  Heparin Dosing Weight: 80 kg   Vital Signs: BP: 104/58 mmHg (01/10 0445) Pulse Rate: 129  (01/10 0445)  Labs:  Basename 11/03/12 0044 11/03/12 0020  HGB 15.0 13.2  HCT 44.0 41.3  PLT -- 169  APTT -- 31  LABPROT -- 14.7  INR -- 1.17  HEPARINUNFRC -- --  CREATININE 0.70 --  CKTOTAL -- --  CKMB -- --  TROPONINI -- --    Estimated Creatinine Clearance: 84.9 ml/min (by C-G formula based on Cr of 0.7).   Medical History: Past Medical History  Diagnosis Date  . GERD (gastroesophageal reflux disease)   . Psoriasis   . Frequent UTI    s/p R humerus fracture approx 3 weeks ago  Medications:  Fosamax  Folic Acid  Methotrexate  Prilosec  Vesicare  Ultram  Assessment: 62 yo female with PE for Heparin.  Heparin 5000 unit sIV bolus, 1350 units/hr started in ED at 0130  Goal of Therapy:  Heparin level 0.3-0.7 units/ml Monitor platelets by anticoagulation protocol: Yes   Plan:  Continue Heparin at current rate Check heparin level this morning.  Eddie Candle 11/03/2012,5:02 AM

## 2012-11-04 DIAGNOSIS — R7309 Other abnormal glucose: Secondary | ICD-10-CM

## 2012-11-04 DIAGNOSIS — F411 Generalized anxiety disorder: Secondary | ICD-10-CM

## 2012-11-04 DIAGNOSIS — R000 Tachycardia, unspecified: Secondary | ICD-10-CM

## 2012-11-04 DIAGNOSIS — S42209A Unspecified fracture of upper end of unspecified humerus, initial encounter for closed fracture: Secondary | ICD-10-CM

## 2012-11-04 DIAGNOSIS — I2699 Other pulmonary embolism without acute cor pulmonale: Principal | ICD-10-CM

## 2012-11-04 LAB — BASIC METABOLIC PANEL
CO2: 26 mEq/L (ref 19–32)
Calcium: 8.2 mg/dL — ABNORMAL LOW (ref 8.4–10.5)
Glucose, Bld: 111 mg/dL — ABNORMAL HIGH (ref 70–99)
Potassium: 3.9 mEq/L (ref 3.5–5.1)
Sodium: 139 mEq/L (ref 135–145)

## 2012-11-04 LAB — CBC
HCT: 36.4 % (ref 36.0–46.0)
Hemoglobin: 11.8 g/dL — ABNORMAL LOW (ref 12.0–15.0)
MCH: 30.2 pg (ref 26.0–34.0)
MCV: 93.1 fL (ref 78.0–100.0)
Platelets: 141 10*3/uL — ABNORMAL LOW (ref 150–400)
RBC: 3.91 MIL/uL (ref 3.87–5.11)
WBC: 8.5 10*3/uL (ref 4.0–10.5)

## 2012-11-04 LAB — PROTIME-INR: INR: 1.44 (ref 0.00–1.49)

## 2012-11-04 MED ORDER — WARFARIN SODIUM 7.5 MG PO TABS
7.5000 mg | ORAL_TABLET | Freq: Once | ORAL | Status: AC
  Administered 2012-11-04: 7.5 mg via ORAL
  Filled 2012-11-04: qty 1

## 2012-11-04 NOTE — Progress Notes (Signed)
Name: Vanessa French MRN: [id number] DOB: 1951/10/17 LOS: 1  PCCM RESIDENT DAILY PROGRESS NOTE  History of Present Illness: This is a 62 y/o female with minimal past medical history (on MTX from [facility] for psoriasis) who was admitted on 11/03/2012 from the [facility] ED with a large bilateral pulmonary embolism in the setting of immobility after a fall and closed R humerus fracture approximately two weeks prior to admission. The humerus fracture has been managed conservatively so far with a sling. She states that after her fall she was immobile at home and did very little walking. On 1/09 she noticed increasing left leg swelling and pain. While walking up a flight of stairs she became light headed and then had what sounds like syncope in her bedroom associated with some chest tightness and shortness of breath. In the ED she was found to have a large bilateral pulmonary embolism. PCCM was consulted due to tachycardia and slight hypotension.   Lines / Drains: none  Cultures: None  Antibiotics: none  Tests / Events: 1/10 admitted after CT angio showed bilateral PE.  Tachycardic and hypoxic but >92% on [place].  BP soft but stable.   Overnight Events:   Did well overnight.  Remains tachycardic but BPs holding steady.  Mild chest heaviness today with some sharp localized pain with deep inhalation.    Vital Signs: Temp:  [98.4 F (36.9 C)-99.5 F (37.5 C)] 98.9 F (37.2 C) (01/11 0732) Pulse Rate:  [90-116] 90  (01/11 0838) Resp:  [12-24] 19  (01/11 0838) BP: (87-119)/(59-86) 104/67 mmHg (01/11 0732) SpO2:  [93 %-99 %] 98 % (01/11 0838) Weight:  [95 kg (209 lb 7 oz)] 95 kg (209 lb 7 oz) (01/11 0429) I/O last 3 completed shifts: In: 2166.7 [P.O.:590; I.V.:1576.7] Out: 1025 [Urine:1025]  Physical Examination: Vitals reviewed. General: resting in bed, NAD HEENT: PERRL, EOMI, no scleral icterus Cardiac: tachycardic but regular rhythm, no rubs, murmurs or gallops Pulm: mild bibasilar inspiratory  crackles.  no wheezes, or rhonchi Abd: soft, nontender, nondistended, BS present Ext: warm and well perfused, Left leg with 2+ pitting edema, right trace edema. Neuro: alert and oriented X3, cranial nerves II-XII grossly intact, strength and sensation to light touch equal in bilateral upper and lower extremities   Labs and Imaging:   Basic Metabolic Panel:  Lab 11/04/12 1027 11/03/12 0044  NA 139 140  K 3.9 3.9  CL 106 104  CO2 26 --  GLUCOSE 111* 158*  BUN 8 15  CREATININE 0.46* 0.70  CALCIUM 8.2* --  MG -- --  PHOS -- --   CBC:  Lab 11/04/12 0435 11/03/12 0044 11/03/12 0020  WBC 8.5 -- 13.1*  NEUTROABS -- -- 9.8*  HGB 11.8* 15.0 --  HCT 36.4 44.0 --  MCV 93.1 -- 92.0  PLT 141* -- 169   Coagulation:  Lab 11/04/12 0435 11/03/12 0020  LABPROT 17.2* 14.7  INR 1.44 1.17     Assessment and Plan: PULMONARY ASSESSMENT: 1. Hypoxia secondary to pulmonary embolism: currently stable on [place].   PLAN:   - supplemental oxygen to maintain sats >90%   CARDIOVASCULAR ASSESSMENT:  1. Bilateral large pulmonary embolism:  Likely provoked by immobility from recent humerus fracture.  Currently hemodynamically stable but with tachycardia.  Heparin started.  Discussed oral anticoagulants today and she states that with her insurance plan [place] would likely be 100 dollars per month which would be prohibitive so she likely would go with coumadin.  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  PLAN:  - continue Heparin - start coumadin per pharmacy today, likely need 3-6 months - F/U lower extremity doppler and TTE   RENAL ASSESSMENT:  none PLAN:   Monitor renal function   GASTROINTESTINAL ASSESSMENT:  none PLAN:   Advance diet   HEMATOLOGIC ASSESSMENT:  Provoked PE PLAN:  As above Hg=11.8 following   INFECTIOUS ASSESSMENT:  none PLAN:   none   ENDOCRINE ASSESSMENT:  hyperglycemia   PLAN:   Monitor glucose, consider  SSI 1/11> BS= 110-160   NEUROLOGIC ASSESSMENT:   1. Anxiety 2. Chronic back pain PLAN:   - continue PRN ativan for anxiety - Tramadol for back pain.   CLINICAL SUMMARY: 62 year old woman presents with acute SOB and swollen left leg, found to have bilateral PE likely from immobilization from recent humerus fracture.  Currently hemodynamically stable.  Will continue heparin today and start coumadin per pharmacy today.   Best practices / Disposition: -->ICU status under PCCM -->full code -->Heparin for DVT Px -->Protonix for GI Px -->diet -->family updated at bedside   Aleina Burgio M 11/04/2012, 11:53 AM

## 2012-11-04 NOTE — Progress Notes (Signed)
ANTICOAGULATION CONSULT NOTE - Follow Up Consult   Pharmacy Consult for heparin and warfarin Indication: bilateral pulmonary embolus  Allergies  Allergen Reactions  . Penicillins Other (See Comments)    Yeast infection  . Latex Rash    Patient Measurements: Height: 5\' 6"  (167.6 cm) Weight: 209 lb 7 oz (95 kg) IBW/kg (Calculated) : 59.3  Heparin Dosing Weight: 80kg  Vital Signs: Temp: 98 F (36.7 C) (01/11 1158) Temp src: Axillary (01/11 1158) BP: 124/77 mmHg (01/11 1158) Pulse Rate: 91  (01/11 1158)  Labs:  Basename 11/04/12 0435 11/03/12 1955 11/03/12 1138 11/03/12 0044 11/03/12 0020  HGB 11.8* -- -- 15.0 --  HCT 36.4 -- -- 44.0 41.3  PLT 141* -- -- -- 169  APTT -- -- -- -- 31  LABPROT 17.2* -- -- -- 14.7  INR 1.44 -- -- -- 1.17  HEPARINUNFRC 0.51 0.53 0.73* -- --  CREATININE 0.46* -- -- 0.70 --  CKTOTAL -- -- -- -- --  CKMB -- -- -- -- --  TROPONINI -- -- -- -- --    Estimated Creatinine Clearance: 86.9 ml/min (by C-G formula based on Cr of 0.46).  Assessment: 16 YOF with bilateral PE likely from immobilization after a recent humerus fracture. Heparin level is therapeutic at 0.62, INR is 1.44. Day #2 of overlap. CBC down slightly, but relatively stable. No bleeding noted.  Goal of Therapy:  INR 2-3 Heparin level 0.3-0.7 units/ml Monitor platelets by anticoagulation protocol: Yes   Plan:  1. Continue with heparin drip at 1300 units/hr 2. Warfarin 7.5mg  po x1 tonight 3. Daily HL, CBC, and PT/INR  Deloyd Handy D. Latrelle Fuston, PharmD Clinical Pharmacist Pager: [phone] 11/04/2012 2:47 PM

## 2012-11-05 LAB — HEPARIN LEVEL (UNFRACTIONATED): Heparin Unfractionated: 0.33 IU/mL (ref 0.30–0.70)

## 2012-11-05 LAB — CBC
Hemoglobin: 11.4 g/dL — ABNORMAL LOW (ref 12.0–15.0)
MCH: 29.9 pg (ref 26.0–34.0)
MCV: 92.4 fL (ref 78.0–100.0)
Platelets: 152 10*3/uL (ref 150–400)
RBC: 3.81 MIL/uL — ABNORMAL LOW (ref 3.87–5.11)
WBC: 7.1 10*3/uL (ref 4.0–10.5)

## 2012-11-05 LAB — PROTIME-INR: INR: 2.09 — ABNORMAL HIGH (ref 0.00–1.49)

## 2012-11-05 MED ORDER — PANTOPRAZOLE SODIUM 40 MG PO TBEC
40.0000 mg | DELAYED_RELEASE_TABLET | Freq: Every day | ORAL | Status: DC
  Administered 2012-11-05 – 2012-11-07 (×3): 40 mg via ORAL
  Filled 2012-11-05 (×3): qty 1

## 2012-11-05 MED ORDER — WARFARIN SODIUM 5 MG PO TABS
5.0000 mg | ORAL_TABLET | Freq: Once | ORAL | Status: AC
  Administered 2012-11-05: 5 mg via ORAL
  Filled 2012-11-05: qty 1

## 2012-11-05 NOTE — Progress Notes (Signed)
Pt ambulated with staff around unit on room air. While she did complain of shortness of breath, she denied chest pain, dizziness and weakness.  Safely returned to bed, O2 sats 98%, immediately after returning.  Pt is resting comfortably at this time.  Will continue to monitor.

## 2012-11-05 NOTE — Progress Notes (Signed)
Name: Vanessa French MRN: [id number] DOB: [birth year] LOS: 2  PCCM RESIDENT DAILY PROGRESS NOTE  History of Present Illness: This is a 62 y/o female with minimal past medical history (on MTX from [facility] for psoriasis) who was admitted on 11/03/2012 from the [facility] ED with a large bilateral pulmonary embolism in the setting of immobility after a fall and closed R humerus fracture approximately two weeks prior to admission. The humerus fracture has been managed conservatively so far with a sling. She states that after her fall she was immobile at home and did very little walking. On 1/09 she noticed increasing left leg swelling and pain. While walking up a flight of stairs she became light headed and then had what sounds like syncope in her bedroom associated with some chest tightness and shortness of breath. In the ED she was found to have a large bilateral pulmonary embolism. PCCM was consulted due to tachycardia and slight hypotension.   Lines / Drains: none  Cultures: None  Antibiotics: none  Tests / Events: 1/10 admitted after CT angio showed bilateral PE.  Tachycardic and hypoxic but >92% on [place].  BP soft but stable.   Overnight Events:   Did well overnight.  Remains tachycardic but BPs holding steady.  Mild chest heaviness today with some sharp localized pain with deep inhalation.    Vital Signs: Temp:  [97.6 F (36.4 C)-99.4 F (37.4 C)] 98.1 F (36.7 C) (01/12 0800) Pulse Rate:  [82-108] 82  (01/12 0800) Resp:  [13-28] 18  (01/12 0800) BP: (109-124)/(67-77) 117/68 mmHg (01/12 0800) SpO2:  [89 %-100 %] 98 % (01/12 0800) Weight:  [92 kg (202 lb 13.2 oz)] 92 kg (202 lb 13.2 oz) (01/12 0456) I/O last 3 completed shifts: In: 1428 [P.O.:600; I.V.:828] Out: 2450 [Urine:2450]  Physical Examination: Vitals reviewed. General: resting in bed, NAD HEENT: PERRL, EOMI, no scleral icterus Cardiac: tachycardic but regular rhythm, no rubs, murmurs or gallops Pulm: mild bibasilar  inspiratory crackles.  no wheezes, or rhonchi Abd: soft, nontender, nondistended, BS present Ext: warm and well perfused, Left leg with 2+ pitting edema, right trace edema. Neuro: alert and oriented X3, cranial nerves II-XII grossly intact, strength and sensation to light touch equal in bilateral upper and lower extremities   Labs and Imaging:   Basic Metabolic Panel:  Lab 11/04/12 1027 11/03/12 0044  NA 139 140  K 3.9 3.9  CL 106 104  CO2 26 --  GLUCOSE 111* 158*  BUN 8 15  CREATININE 0.46* 0.70  CALCIUM 8.2* --  MG -- --  PHOS -- --   CBC:  Lab 11/05/12 0440 11/04/12 0435 11/03/12 0020  WBC 7.1 8.5 --  NEUTROABS -- -- 9.8*  HGB 11.4* 11.8* --  HCT 35.2* 36.4 --  MCV 92.4 93.1 --  PLT 152 141* --   Coagulation:  Lab 11/05/12 0440 11/04/12 0435 11/03/12 0020  LABPROT 22.6* 17.2* 14.7  INR 2.09* 1.44 1.17     Assessment and Plan: PULMONARY ASSESSMENT: 1. Hypoxia secondary to pulmonary embolism: currently stable on [place].   PLAN:   - supplemental oxygen to maintain sats >90%   CARDIOVASCULAR ASSESSMENT:  1. Bilateral large pulmonary embolism:  Likely provoked by immobility from recent humerus fracture.  Currently hemodynamically stable but with tachycardia.  Heparin started.  Discussed oral anticoagulants today and she states that with her insurance plan [employer] would likely be 100 dollars per month which would be prohibitive so she likely would go with coumadin.  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  PLAN:  - continue Heparin - on coumadin per pharmacy - F/U lower extremity doppler and TTE 1/12> ambulate & check RA sats   RENAL ASSESSMENT:  none PLAN:   Monitor renal function   GASTROINTESTINAL ASSESSMENT:  C/o some indigestion & requests Omeprazole... PLAN:   Protonix ordered per [facility] formulary   HEMATOLOGIC ASSESSMENT:  Provoked PE PLAN:  As above Hg=11.8 following   INFECTIOUS ASSESSMENT:   none PLAN:   none   ENDOCRINE ASSESSMENT:  hyperglycemia   PLAN:   Monitor glucose, consider SSI 1/11> BS= 110-160   NEUROLOGIC ASSESSMENT:   1. Anxiety 2. Chronic back pain PLAN:   - continue PRN ativan for anxiety - Tramadol for back pain.   CLINICAL SUMMARY: 62 year old woman presents with acute SOB and swollen left leg, found to have bilateral PE likely from immobilization from recent humerus fracture.  Currently hemodynamically stable.  Will continue heparin today and start coumadin per pharmacy today.   Best practices / Disposition: -->ICU status under PCCM -->full code -->Heparin for DVT Px -->Protonix for GI Px -->diet -->family updated at bedside   Kairah Leoni M 11/05/2012, 11:44 AM

## 2012-11-05 NOTE — Progress Notes (Signed)
ANTICOAGULATION CONSULT NOTE - Follow Up Consult   Pharmacy Consult for heparin and warfarin Indication: bilateral pulmonary embolus  Allergies  Allergen Reactions  . Penicillins Other (See Comments)    Yeast infection  . Latex Rash    Patient Measurements: Height: 5\' 6"  (167.6 cm) Weight: 202 lb 13.2 oz (92 kg) IBW/kg (Calculated) : 59.3  Heparin Dosing Weight: 80kg  Vital Signs: Temp: 98.1 F (36.7 C) (01/12 0800) Temp src: Oral (01/12 0800) BP: 117/68 mmHg (01/12 0800) Pulse Rate: 82  (01/12 0800)  Labs:  Basename 11/05/12 0440 11/04/12 0435 11/03/12 1955 11/03/12 0044 11/03/12 0020  HGB 11.4* 11.8* -- -- --  HCT 35.2* 36.4 -- 44.0 --  PLT 152 141* -- -- 169  APTT -- -- -- -- 31  LABPROT 22.6* 17.2* -- -- 14.7  INR 2.09* 1.44 -- -- 1.17  HEPARINUNFRC 0.33 0.51 0.53 -- --  CREATININE -- 0.46* -- 0.70 --  CKTOTAL -- -- -- -- --  CKMB -- -- -- -- --  TROPONINI -- -- -- -- --    Estimated Creatinine Clearance: 85.5 ml/min (by C-G formula based on Cr of 0.46).  Assessment: 49 YOF with bilateral PE likely from immobilization after a recent humerus fracture. Heparin level has dropped but is still therapeutic at 0.33, INR has jumped up to 2.09<<1.44. Day #3 of overlap. CBC relatively stable. No bleeding noted.  Goal of Therapy:  INR 2-3 Heparin level 0.3-0.7 units/ml Monitor platelets by anticoagulation protocol: Yes   Plan:  1. Continue with heparin drip at 1300 units/hr 2. Warfarin 5mg  po x1 tonight 3. Daily HL, CBC, and PT/INR  [employer], Pharm.D. Clinical Pharmacist   Pager: [phone] 11/05/2012 9:32 AM

## 2012-11-06 DIAGNOSIS — I959 Hypotension, unspecified: Secondary | ICD-10-CM

## 2012-11-06 LAB — CBC
HCT: 35 % — ABNORMAL LOW (ref 36.0–46.0)
Platelets: 150 10*3/uL (ref 150–400)
RBC: 3.85 MIL/uL — ABNORMAL LOW (ref 3.87–5.11)
RDW: 13.8 % (ref 11.5–15.5)
WBC: 5 10*3/uL (ref 4.0–10.5)

## 2012-11-06 LAB — PROTIME-INR
INR: 3.2 — ABNORMAL HIGH (ref 0.00–1.49)
Prothrombin Time: 31 seconds — ABNORMAL HIGH (ref 11.6–15.2)

## 2012-11-06 MED ORDER — WARFARIN VIDEO
Freq: Once | Status: AC
  Administered 2012-11-06: 10:00:00

## 2012-11-06 MED ORDER — COUMADIN BOOK
Freq: Once | Status: AC
  Administered 2012-11-06: 10:00:00
  Filled 2012-11-06: qty 1

## 2012-11-06 NOTE — Progress Notes (Signed)
Name: Vanessa French MRN: [id number] DOB: April 27, 1951 LOS: 3  PCCM RESIDENT DAILY PROGRESS NOTE  History of Present Illness: This is a 62 y/o female with minimal past medical history (on MTX from [facility] for psoriasis) who was admitted on 11/03/2012 from the [facility] ED with a large bilateral pulmonary embolism in the setting of immobility after a fall and closed R humerus fracture approximately two weeks prior to admission. The humerus fracture has been managed conservatively so far with a sling. She states that after her fall she was immobile at home and did very little walking.  1/09 she noticed increasing left leg swelling and pain. While walking up a flight of stairs she became light headed and then had what sounds like syncope in her bedroom associated with some chest tightness and shortness of breath. In the ED she was found to have a large bilateral pulmonary embolism. PCCM was consulted due to tachycardia and slight hypotension.   Lines / Drains: None  Cultures: None  Antibiotics: none  Tests / Events: 1/10 admitted after CT angio showed bilateral PE.  Tachycardic and hypoxic but >92% on [place].  BP soft but stable.   Overnight Events:  No acute events.  Up in chair.     Vital Signs: Temp:  [98.1 F (36.7 C)-98.5 F (36.9 C)] 98.2 F (36.8 C) (01/13 1156) Pulse Rate:  [77-89] 77  (01/13 1156) Resp:  [13-18] 13  (01/13 1156) BP: (108-136)/(71-79) 114/77 mmHg (01/13 1156) SpO2:  [95 %-99 %] 99 % (01/13 1156) I/O last 3 completed shifts: In: 1285 [P.O.:480; I.V.:805] Out: 3400 [Urine:3400]  Physical Examination:  General: resting in chair, NAD HEENT: PERRL, EOMI, no scleral icterus Cardiac: tachycardic but regular rhythm, no rubs, murmurs or gallops Pulm: mild bibasilar inspiratory crackles.  no wheezes, or rhonchi Abd: soft, nontender, nondistended, BS present Ext: warm and well perfused, Left leg with 2+ pitting edema, right trace edema. Neuro: alert and oriented X3, cranial  nerves II-XII grossly intact, strength and sensation to light touch equal in bilateral upper and lower extremities   Labs and Imaging:   Basic Metabolic Panel:  Lab 11/04/12 4098 11/03/12 0044  NA 139 140  K 3.9 3.9  CL 106 104  CO2 26 --  GLUCOSE 111* 158*  BUN 8 15  CREATININE 0.46* 0.70  CALCIUM 8.2* --  MG -- --  PHOS -- --   CBC:  Lab 11/06/12 0700 11/05/12 0440 11/03/12 0020  WBC 5.0 7.1 --  NEUTROABS -- -- 9.8*  HGB 11.3* 11.4* --  HCT 35.0* 35.2* --  MCV 90.9 92.4 --  PLT 150 152 --   Coagulation:  Lab 11/06/12 0700 11/05/12 0440 11/04/12 0435 11/03/12 0020  LABPROT 31.0* 22.6* 17.2* 14.7  INR 3.20* 2.09* 1.44 1.17     Assessment and Plan: PULMONARY ASSESSMENT: 1. Hypoxia secondary to pulmonary embolism: currently stable on [place].    PLAN:   - Supplemental oxygen to maintain sats >90%  CARDIOVASCULAR ASSESSMENT:  1. Bilateral large pulmonary embolism:  Likely provoked by immobility from recent humerus fracture.  Currently hemodynamically stable but with tachycardia.  Heparin started.  Discussed oral anticoagulants today and she states that with her insurance plan [employer] would likely be 100 dollars per month which would be prohibitive so she likely would go with coumadin.  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   PLAN:  - continue Heparin - coumadin per pharmacy - F/U  lower extremity doppler and TTE - RA sats good with ambulation  RENAL ASSESSMENT:  none  PLAN:   Monitor renal function   GASTROINTESTINAL ASSESSMENT:  C/o some indigestion & requests Omeprazole  PLAN:   Protonix    HEMATOLOGIC ASSESSMENT:  Provoked PE  PLAN:  As above Hg=11.8 following   INFECTIOUS ASSESSMENT:  none  PLAN:   none   ENDOCRINE ASSESSMENT:  hyperglycemia    PLAN:   Monitor glucose, consider SSI 1/11> BS= 110-160   NEUROLOGIC ASSESSMENT:   1. Anxiety 2. Chronic back pain  PLAN:   - continue  PRN ativan for anxiety - Tramadol for back pain.   CLINICAL SUMMARY: 62 year old woman presents with acute SOB and swollen left leg, found to have bilateral PE likely from immobilization from recent humerus fracture.  Currently hemodynamically stable.  Will continue heparin and coumadin.  Is on day 4 of overlap.  Likely can d/c tomorrow afternoon if INR remains > 2.0.   Will f/u with Dr. Oneta Rack for INR.    Best practices / Disposition: -->ICU status under PCCM -->full code -->Heparin for DVT Px -->Protonix for GI Px -->diet -->family updated at bedside  Canary Brim, NP-C [facility] Pgr: [phone] or [phone]  .Patient seen and examined, agree with above note.  I dictated the care and orders written for this patient under my direction.  Koren Bound, M.D. [phone]

## 2012-11-06 NOTE — Progress Notes (Signed)
ANTICOAGULATION CONSULT NOTE - Follow Up Consult   Pharmacy Consult for heparin and warfarin Indication: bilateral pulmonary embolus  Allergies  Allergen Reactions  . Penicillins Other (See Comments)    Yeast infection  . Latex Rash    Patient Measurements: Height: 5\' 6"  (167.6 cm) Weight: 202 lb 13.2 oz (92 kg) IBW/kg (Calculated) : 59.3  Heparin Dosing Weight: 80kg  Vital Signs: Temp: 98.3 F (36.8 C) (01/13 0825) Temp src: Oral (01/13 0825) BP: 119/75 mmHg (01/13 0825) Pulse Rate: 78  (01/13 0825)  Labs:  Basename 11/06/12 0700 11/05/12 0440 11/04/12 0435  HGB 11.3* 11.4* --  HCT 35.0* 35.2* 36.4  PLT 150 152 141*  APTT -- -- --  LABPROT 31.0* 22.6* 17.2*  INR 3.20* 2.09* 1.44  HEPARINUNFRC 0.30 0.33 0.51  CREATININE -- -- 0.46*  CKTOTAL -- -- --  CKMB -- -- --  TROPONINI -- -- --    Estimated Creatinine Clearance: 85.5 ml/min (by C-G formula based on Cr of 0.46).  Assessment: 12 YOF with bilateral PE likely from immobilization after a recent humerus fracture. Heparin level is at low end of goal but INR has increased to above goal.  INR has jumped considerably since Coumadin initiation (1.44>>2.09>>3.2).  Will need to continue heparin despite supratherapeutic INR since today is only day#4 of overlap therapy.  If INR remains >=2 tomorrow, can d/c heparin tomorrow afternoon.  CBC relatively stable. No bleeding noted.  Goal of Therapy:  INR 2-3 Heparin level 0.3-0.7 units/ml Monitor platelets by anticoagulation protocol: Yes   Plan:  1. Continue with heparin drip at 1300 units/hr 2. No Coumadin tonight. 3. Daily HL, CBC, and PT/INR  Lashonda Sonneborn, Pharm.D., BCPS Clinical Pharmacist Pager [phone] 11/06/2012 9:40 AM

## 2012-11-07 LAB — PROTIME-INR
INR: 3.2 — ABNORMAL HIGH (ref 0.00–1.49)
Prothrombin Time: 31 seconds — ABNORMAL HIGH (ref 11.6–15.2)

## 2012-11-07 LAB — BASIC METABOLIC PANEL
BUN: 11 mg/dL (ref 6–23)
Calcium: 8.7 mg/dL (ref 8.4–10.5)
Creatinine, Ser: 0.53 mg/dL (ref 0.50–1.10)
GFR calc Af Amer: >90 mL/min (ref >90)
GFR calc non Af Amer: >90 mL/min (ref >90)

## 2012-11-07 LAB — CBC
MCHC: 33.1 g/dL (ref 30.0–36.0)
RDW: 13.8 % (ref 11.5–15.5)
WBC: 6 10*3/uL (ref 4.0–10.5)

## 2012-11-07 LAB — HEPARIN LEVEL (UNFRACTIONATED): Heparin Unfractionated: 0.24 IU/mL — ABNORMAL LOW (ref 0.30–0.70)

## 2012-11-07 MED ORDER — WARFARIN SODIUM 2.5 MG PO TABS: ORAL_TABLET | ORAL | Status: DC

## 2012-11-07 MED ORDER — WARFARIN SODIUM 2.5 MG PO TABS
2.5000 mg | ORAL_TABLET | Freq: Once | ORAL | Status: DC
  Filled 2012-11-07: qty 1

## 2012-11-07 MED ORDER — WARFARIN SODIUM 2.5 MG PO TABS: 2.5000 mg | ORAL_TABLET | Freq: Once | ORAL | Status: DC

## 2012-11-07 NOTE — Progress Notes (Signed)
ANTICOAGULATION CONSULT NOTE - Follow Up Consult   Pharmacy Consult for warfarin Indication: bilateral pulmonary embolus  Allergies  Allergen Reactions  . Penicillins Other (See Comments)    Yeast infection  . Latex Rash    Patient Measurements: Height: 5\' 6"  (167.6 cm) Weight: 202 lb 13.2 oz (92 kg) IBW/kg (Calculated) : 59.3  Heparin Dosing Weight: 80kg  Vital Signs: Temp: 98.2 F (36.8 C) (01/14 0834) Temp src: Oral (01/14 0834) BP: 125/84 mmHg (01/14 0834) Pulse Rate: 82  (01/14 0834)  Labs:  Basename 11/07/12 0500 11/06/12 0700 11/05/12 0440  HGB 12.3 11.3* --  HCT 37.2 35.0* 35.2*  PLT 166 150 152  APTT -- -- --  LABPROT 31.0* 31.0* 22.6*  INR 3.20* 3.20* 2.09*  HEPARINUNFRC 0.24* 0.30 0.33  CREATININE 0.53 -- --  CKTOTAL -- -- --  CKMB -- -- --  TROPONINI -- -- --    Estimated Creatinine Clearance: 84.4 ml/min (by C-G formula based on Cr of 0.53).  Assessment: 43 YOF with bilateral PE likely from immobilization after a recent humerus fracture. Heparin stopped today after 5 day overlap. INR has jumped considerably since Coumadin initiation (1.44>>2.09>>3.2>>3.2).    Goal of Therapy:  INR 2-3 Heparin level 0.3-0.7 units/ml Monitor platelets by anticoagulation protocol: Yes   Plan:  1. Warfarin 2.5mg  x 1 dose today. 2. Daily HL, CBC, and PT/INR  Celedonio Miyamoto, PharmD, [facility] Clinical Pharmacist Pager [phone]  11/07/2012 9:35 AM

## 2012-11-07 NOTE — Discharge Summary (Addendum)
Physician Discharge Summary  Patient ID: Vanessa French MRN: [id number] DOB/AGE: 02/25/1951 62 y.o.  Admit date: 11/03/2012 Discharge date: 11/07/2012    Discharge Diagnoses:  Principal Problem:  *Acute pulmonary embolism Active Problems:  Closed fracture of right proximal humerus  Anxiety  Hypotension  Tachycardia    Brief Summary: Vanessa French is a 62 y.o. y/o female with minimal past medical history (on MTX from Dr. Emily Filbert for psoriasis) who was admitted on 11/03/2012 from the [facility] ED with a large bilateral pulmonary embolism in the setting of immobility after a fall and closed R humerus fracture approximately two weeks prior to admission. The humerus fracture has been managed conservatively with a sling. After her fall she was immobile at home and did very little walking. She also had another fall earlier in the summer with other arm fracture.  1/09 she noticed increasing left leg swelling and pain. While walking up a flight of stairs she became light headed and then had what sounds like syncope in her bedroom associated with some chest tightness and shortness of breath. In the ED she was found to have a large bilateral pulmonary embolism. Treated with IV Heparin and oral coumadin for 5 days overlap.    Microbiology/Sepsis markers: 1/10MRSA PCR>>>neg  Significant Diagnostic Studies:  1/10 admitted after CT angio showed bilateral PE. Tachycardic and hypoxic but >92% on [place]. BP soft but stable                                                                     Hospital Summary by Discharge Diagnosis  Hypoxia secondary to pulmonary embolism   Bilateral large pulmonary embolism Likely provoked by immobility from recent humerus fracture. Treated with IV Heparin and oral coumadin for 5 days overlap. Discussed oral anticoagulants and she states that with her insurance plan [employer] would likely be 100 dollars per month which would be prohibitive so she likely would go with coumadin. 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   Discharge Plan:  - continue coumadin for planned 9 months - follow up PT/INR in office on 1/16  - RA sats good with ambulation  - coumadin 2.5 mg daily at 6pm until follow up   GERD On home omeprazole.    Discharge Plan:  -resume home medications  Hyperglycemia  Blood sugars ran 110-160 during admit.  No further interventions at present.   Anxiety  Chronic back pain   Discharge Plan: -resume home medications    Discharge Exam: General: resting in chair, NAD  HEENT: PERRL, EOMI, no scleral icterus  Cardiac: tachycardic but regular rhythm, no rubs, murmurs or gallops  Pulm: mild bibasilar inspiratory crackles. no wheezes, or rhonchi  Abd: soft, nontender, nondistended, BS present  Ext: warm and well perfused, Left leg with 2+ pitting edema, right trace edema.  Neuro: alert and oriented X3, cranial nerves II-XII grossly intact, strength and sensation to light touch equal in bilateral upper and lower extremities   Filed Vitals:   11/07/12 0200 11/07/12 0300 11/07/12 0359 11/07/12 0834  BP:   129/73 125/84  Pulse: 87 80 79 82  Temp:   98.1 F (36.7 C) 98.2 F (36.8 C)  TempSrc:   Oral Oral  Resp: 22 14 12 15   Height:      Weight:      SpO2: 95% 94% 95% 93%     Discharge Labs  BMET  Lab 11/07/12 0500 11/04/12 0435 11/03/12 0044  NA 142 139 140  K 3.8 3.9 --  CL 106 106 104  CO2 27 26 --  GLUCOSE 105* 111* 158*  BUN 11 8 15   CREATININE 0.53 0.46* 0.70  CALCIUM 8.7 8.2* --  MG -- -- --  PHOS -- -- --   CBC  Lab 11/07/12 0500 11/06/12 0700 11/05/12 0440  HGB 12.3 11.3* 11.4*  HCT 37.2 35.0* 35.2*  WBC 6.0 5.0 7.1  PLT 166 150 152   Anti-Coagulation  Lab 11/07/12 0500 11/06/12 0700 11/05/12 0440 11/04/12 0435 11/03/12 0020  INR 3.20* 3.20* 2.09* 1.44 1.17    Discharge Orders    Future Orders Please Complete By Expires   Diet general      Increase activity  slowly      Discharge instructions      Comments:   Will need PT/INR on Thursday 1/16   Call MD for:  difficulty breathing, headache or visual disturbances      Call MD for:      Scheduling Instructions:   Excessive bleeding, nose bleeds, bleeding from gums, cough up blood. Caution with shaving / cuts.  Will have to hold pressure longer than usual to stop bleeding.     Follow-up Information    Follow up with Nadean Corwin, MD. On 11/09/2012. (Will need PT/INR on Thursday.  )    Contact information:   [address] [place] [address] [phone]       Follow up with Pulmonary Office. (Office will call you in 6 months)           Medication List     As of 11/07/2012 11:59 AM    START taking these medications         warfarin 2.5 MG tablet   Commonly known as: COUMADIN   2.5 mg daily at 6pm until follow up INR on 1/16      CONTINUE taking these medications         alendronate 70 MG tablet   Commonly known as: FOSAMAX      calcipotriene 0.005 % cream   Commonly known as: DOVONOX      calcium carbonate 750 MG chewable tablet   Commonly known as: TUMS EX      fluticasone 0.05 % cream   Commonly known as: CUTIVATE      FOLIC ACID PO      methotrexate 10 MG tablet   Commonly known as: RHEUMATREX      omeprazole 20 MG capsule   Commonly known as: PRILOSEC      solifenacin 5 MG tablet   Commonly known as: VESICARE      traMADol 50 MG tablet   Commonly known as: ULTRAM          Where to get your medications    These are the prescriptions that you need to pick up.   You may get these medications from any pharmacy.         warfarin 2.5 MG tablet              Disposition: 01-Home or Self Care  Discharged Condition: Vanessa French has met maximum benefit of inpatient care and is medically stable and cleared for discharge.  Patient  is pending follow up as above.   She requests to call Dr. Oneta Rack for appt on Thurs as she works  for him and he will follow her PT/INR.  Pulmonary office will call her for follow up in 6 months.    Time spent on disposition:  Greater than 35 minutes.   Signed: Canary Brim, NP-C [facility] Pgr: [phone]  Patient seen and examined, agree with above note.  Patient clear for discharge.  Will f/u with primary for managing of coumadin and with PCCM in 6 months to evaluate d/c of anticoagulate.  Alyson Reedy, M.D. [facility] [phone].

## 2012-11-07 NOTE — Progress Notes (Signed)
Pt discharged to home; reviewed discharge instructions with pt; pt verbalized understanding.

## 2012-11-07 NOTE — Progress Notes (Signed)
Utilization Review Completed.  11/07/2012  

## 2012-11-14 NOTE — Discharge Summary (Signed)
Patient seen and examined, agree with above note.  I dictated the care and orders written for this patient under my direction.  Mckinzie Saksa G Efrain Clauson, MD [phone] 

## 2013-01-15 ENCOUNTER — Other Ambulatory Visit (HOSPITAL_COMMUNITY): Payer: Self-pay | Admitting: Internal Medicine

## 2013-01-15 DIAGNOSIS — Z1231 Encounter for screening mammogram for malignant neoplasm of breast: Secondary | ICD-10-CM

## 2013-02-23 ENCOUNTER — Ambulatory Visit (HOSPITAL_COMMUNITY)
Admission: RE | Admit: 2013-02-23 | Discharge: 2013-02-23 | Disposition: A | Discharge status: Home / Self Care | Payer: BC Managed Care – PPO | Source: Ambulatory Visit | Attending: Internal Medicine | Admitting: Internal Medicine

## 2013-02-23 DIAGNOSIS — Z1231 Encounter for screening mammogram for malignant neoplasm of breast: Secondary | ICD-10-CM | POA: Insufficient documentation

## 2013-02-26 ENCOUNTER — Other Ambulatory Visit: Payer: Self-pay | Admitting: Internal Medicine

## 2013-02-26 DIAGNOSIS — R928 Other abnormal and inconclusive findings on diagnostic imaging of breast: Secondary | ICD-10-CM

## 2013-03-09 ENCOUNTER — Ambulatory Visit
Admission: RE | Admit: 2013-03-09 | Discharge: 2013-03-09 | Disposition: A | Discharge status: Home / Self Care | Payer: BC Managed Care – PPO | Source: Ambulatory Visit | Attending: Internal Medicine | Admitting: Internal Medicine

## 2013-03-09 DIAGNOSIS — R928 Other abnormal and inconclusive findings on diagnostic imaging of breast: Secondary | ICD-10-CM

## 2013-04-02 ENCOUNTER — Other Ambulatory Visit (HOSPITAL_COMMUNITY): Payer: Self-pay | Admitting: Internal Medicine

## 2013-04-02 ENCOUNTER — Ambulatory Visit (HOSPITAL_COMMUNITY)
Admission: RE | Admit: 2013-04-02 | Discharge: 2013-04-02 | Disposition: A | Discharge status: Home / Self Care | Payer: BC Managed Care – PPO | Source: Ambulatory Visit | Attending: Internal Medicine | Admitting: Internal Medicine

## 2013-04-02 DIAGNOSIS — M549 Dorsalgia, unspecified: Secondary | ICD-10-CM

## 2013-04-02 DIAGNOSIS — IMO0002 Reserved for concepts with insufficient information to code with codable children: Secondary | ICD-10-CM | POA: Insufficient documentation

## 2013-04-02 DIAGNOSIS — S22009A Unspecified fracture of unspecified thoracic vertebra, initial encounter for closed fracture: Secondary | ICD-10-CM | POA: Insufficient documentation

## 2013-04-02 DIAGNOSIS — R071 Chest pain on breathing: Secondary | ICD-10-CM

## 2013-05-04 ENCOUNTER — Encounter: Payer: Self-pay | Admitting: Pulmonary Disease

## 2013-05-04 ENCOUNTER — Ambulatory Visit (INDEPENDENT_AMBULATORY_CARE_PROVIDER_SITE_OTHER): Payer: BC Managed Care – PPO | Admitting: Pulmonary Disease

## 2013-05-04 VITALS — BP 142/98 | HR 99 | Temp 98.3°F | Ht 66.0 in

## 2013-05-04 DIAGNOSIS — I2699 Other pulmonary embolism without acute cor pulmonale: Secondary | ICD-10-CM

## 2013-05-04 NOTE — Patient Instructions (Addendum)
Will schedule for a followup echo to re-evaluate the right side of your heart and to check pulmonary artery pressures.  Will call you with results. If the echo is normal, I would recommend discontinuing your coumadin at 9 months of treatment.  Will send a note to your primary physician. If your echo is normal, no further pulmonary followup is required.

## 2013-05-04 NOTE — Progress Notes (Signed)
  Subjective:    Patient ID: Vanessa French, female    DOB: 1951/05/03, 62 y.o.   MRN: [id number]  HPI The patient is a 62 year old female who I've been asked to see for management of a pulmonary embolus.  In January, she had a period of immobility secondary to a closed right humerus fracture, and noticed the onset of left leg swelling and pain followed by syncope.  She was found to have bilateral pulmonary emboli on CT, and was admitted for IV heparin and Coumadin.  She had an echocardiogram during that time that showed decreased right ventricular function, and mild right ventricular dilatation.  She was discharged on Coumadin, and has been following up closely with her primary physician.  She tells me that her protimes have been in a therapeutic range.  The patient states that her breathing has improved, but she continues to have the stamina/endurance issues.  She has not had any pleuritic chest pain or hemoptysis.  She has had a followup chest x-ray last month that was unremarkable.   Review of Systems  Constitutional: Negative for fever and unexpected weight change.  HENT: Negative for ear pain, nosebleeds, congestion, sore throat, rhinorrhea, sneezing, trouble swallowing, dental problem, postnasal drip and sinus pressure.   Eyes: Negative for redness and itching.  Respiratory: Negative for cough, chest tightness, shortness of breath and wheezing.   Cardiovascular: Negative for palpitations and leg swelling.  Gastrointestinal: Negative for nausea and vomiting.       Acid heartburn  Genitourinary: Negative for dysuria.  Musculoskeletal: Positive for joint swelling and arthralgias.       From broken left arm  Skin: Negative for rash.  Neurological: Negative for headaches.  Hematological: Does not bruise/bleed easily.  Psychiatric/Behavioral: Positive for dysphoric mood ( treated with low dose meds). The patient is not nervous/anxious.        Objective:   Physical Exam Constitutional:   Obese female, no acute distress  HENT:  Nares patent without discharge  Oropharynx without exudate, palate and uvula are normal  Eyes:  Perrla, eomi, no scleral icterus  Neck:  No JVD, no TMG  Cardiovascular:  Normal rate, regular rhythm, no rubs or gallops.  No murmurs        Intact distal pulses  Pulmonary :  Normal breath sounds, no stridor or respiratory distress   No rales, rhonchi, or wheezing  Abdominal:  Soft, nondistended, bowel sounds present.  No tenderness noted.   Musculoskeletal:  mild lower extremity edema noted, +varicosities  Lymph Nodes:  No cervical lymphadenopathy noted  Skin:  No cyanosis noted  Neurologic:  Alert, appropriate, moves all 4 extremities without obvious deficit.         Assessment & Plan:

## 2013-05-04 NOTE — Assessment & Plan Note (Signed)
The patient is status post acute pulmonary embolus in January of this year after a period of immobility secondary to a closed right humerus fracture.  She did not have any complications from her hospitalization, but her activity did show some abnormalities in her right ventricle.  She has been on Coumadin since discharge, and tells me that her protimes have been therapeutic.  She states that her breathing has improved, but her endurance continues to be an issue.  She has not had a followup echocardiogram.  At this point, I would recommend a followup echo to reevaluate her right ventricle and to estimate pulmonary artery pressures.  If this is normal, I would recommend discontinuing her Coumadin at the 9 month mark, which would place it sometime in October.  I will send a note to her primary care physician making this recommendation.

## 2013-05-11 ENCOUNTER — Ambulatory Visit (HOSPITAL_COMMUNITY): Payer: BC Managed Care – PPO | Attending: Cardiology | Admitting: Radiology

## 2013-05-11 DIAGNOSIS — I2699 Other pulmonary embolism without acute cor pulmonale: Secondary | ICD-10-CM

## 2013-05-11 DIAGNOSIS — R000 Tachycardia, unspecified: Secondary | ICD-10-CM | POA: Insufficient documentation

## 2013-05-11 DIAGNOSIS — I959 Hypotension, unspecified: Secondary | ICD-10-CM | POA: Insufficient documentation

## 2013-05-11 DIAGNOSIS — F411 Generalized anxiety disorder: Secondary | ICD-10-CM | POA: Insufficient documentation

## 2013-05-11 NOTE — Progress Notes (Signed)
Echocardiogram performed.  

## 2013-08-04 ENCOUNTER — Encounter: Payer: Self-pay | Admitting: *Deleted

## 2013-08-10 ENCOUNTER — Encounter: Payer: Self-pay | Admitting: Internal Medicine

## 2013-09-25 ENCOUNTER — Other Ambulatory Visit: Payer: Self-pay

## 2013-09-25 DIAGNOSIS — R5383 Other fatigue: Secondary | ICD-10-CM

## 2013-09-25 DIAGNOSIS — R5381 Other malaise: Secondary | ICD-10-CM

## 2013-09-25 DIAGNOSIS — Z79899 Other long term (current) drug therapy: Secondary | ICD-10-CM

## 2013-09-25 DIAGNOSIS — I10 Essential (primary) hypertension: Secondary | ICD-10-CM

## 2013-09-25 LAB — HEPATIC FUNCTION PANEL
ALT: 19 U/L (ref 0–35)
AST: 19 U/L (ref 0–37)
Bilirubin, Direct: 0.1 mg/dL (ref 0.0–0.3)
Indirect Bilirubin: 0.3 mg/dL (ref 0.0–0.9)

## 2013-09-25 LAB — CBC WITH DIFFERENTIAL/PLATELET
Hemoglobin: 14.3 g/dL (ref 12.0–15.0)
Lymphs Abs: 2.1 10*3/uL (ref 0.7–4.0)
Monocytes Relative: 9 % (ref 3–12)
Neutro Abs: 4 10*3/uL (ref 1.7–7.7)
Neutrophils Relative %: 58 % (ref 43–77)
RBC: 4.77 MIL/uL (ref 3.87–5.11)

## 2013-09-25 LAB — BASIC METABOLIC PANEL WITH GFR
CO2: 26 mEq/L (ref 19–32)
Chloride: 102 mEq/L (ref 96–112)
Glucose, Bld: 91 mg/dL (ref 70–99)
Potassium: 4.1 mEq/L (ref 3.5–5.3)
Sodium: 141 mEq/L (ref 135–145)

## 2013-10-29 ENCOUNTER — Other Ambulatory Visit: Payer: Self-pay | Admitting: Emergency Medicine

## 2013-10-29 MED ORDER — SERTRALINE HCL 25 MG PO TABS: 25.0000 mg | ORAL_TABLET | Freq: Every day | ORAL | Status: DC

## 2013-11-02 ENCOUNTER — Other Ambulatory Visit: Payer: Self-pay

## 2013-11-02 DIAGNOSIS — L408 Other psoriasis: Secondary | ICD-10-CM

## 2013-11-02 DIAGNOSIS — Z79899 Other long term (current) drug therapy: Secondary | ICD-10-CM

## 2013-11-02 DIAGNOSIS — I10 Essential (primary) hypertension: Secondary | ICD-10-CM

## 2013-11-02 LAB — CBC WITH DIFFERENTIAL/PLATELET
Basophils Absolute: 0 10*3/uL (ref 0.0–0.1)
Basophils Relative: 0 % (ref 0–1)
EOS PCT: 3 % (ref 0–5)
Eosinophils Absolute: 0.2 10*3/uL (ref 0.0–0.7)
HEMATOCRIT: 44.5 % (ref 36.0–46.0)
HEMOGLOBIN: 14.9 g/dL (ref 12.0–15.0)
LYMPHS PCT: 28 % (ref 12–46)
Lymphs Abs: 1.7 10*3/uL (ref 0.7–4.0)
MCH: 30.6 pg (ref 26.0–34.0)
MCHC: 33.5 g/dL (ref 30.0–36.0)
MCV: 91.4 fL (ref 78.0–100.0)
MONO ABS: 0.6 10*3/uL (ref 0.1–1.0)
MONOS PCT: 10 % (ref 3–12)
NEUTROS ABS: 3.5 10*3/uL (ref 1.7–7.7)
Neutrophils Relative %: 59 % (ref 43–77)
Platelets: 209 10*3/uL (ref 150–400)
RBC: 4.87 MIL/uL (ref 3.87–5.11)
RDW: 14.3 % (ref 11.5–15.5)
WBC: 6 10*3/uL (ref 4.0–10.5)

## 2013-11-02 LAB — BASIC METABOLIC PANEL WITH GFR
BUN: 11 mg/dL (ref 6–23)
CHLORIDE: 103 meq/L (ref 96–112)
CO2: 28 meq/L (ref 19–32)
CREATININE: 0.58 mg/dL (ref 0.50–1.10)
Calcium: 9.2 mg/dL (ref 8.4–10.5)
GFR, Est African American: >89 mL/min
Glucose, Bld: 94 mg/dL (ref 70–99)
POTASSIUM: 4.2 meq/L (ref 3.5–5.3)
Sodium: 141 mEq/L (ref 135–145)

## 2013-11-02 LAB — HEPATIC FUNCTION PANEL
ALK PHOS: 65 U/L (ref 39–117)
ALT: 12 U/L (ref 0–35)
AST: 15 U/L (ref 0–37)
Albumin: 3.8 g/dL (ref 3.5–5.2)
BILIRUBIN INDIRECT: 0.4 mg/dL (ref 0.0–0.9)
BILIRUBIN TOTAL: 0.5 mg/dL (ref 0.3–1.2)
Bilirubin, Direct: 0.1 mg/dL (ref 0.0–0.3)
TOTAL PROTEIN: 7.3 g/dL (ref 6.0–8.3)

## 2013-12-13 ENCOUNTER — Encounter: Payer: Self-pay | Admitting: *Deleted

## 2013-12-13 ENCOUNTER — Other Ambulatory Visit: Payer: Self-pay | Admitting: Emergency Medicine

## 2013-12-13 ENCOUNTER — Ambulatory Visit (HOSPITAL_COMMUNITY)
Admission: RE | Admit: 2013-12-13 | Discharge: 2013-12-13 | Disposition: A | Discharge status: Home / Self Care | Payer: BC Managed Care – PPO | Source: Ambulatory Visit | Attending: Emergency Medicine | Admitting: Emergency Medicine

## 2013-12-13 DIAGNOSIS — M25561 Pain in right knee: Secondary | ICD-10-CM

## 2013-12-13 DIAGNOSIS — M259 Joint disorder, unspecified: Secondary | ICD-10-CM | POA: Insufficient documentation

## 2013-12-13 DIAGNOSIS — M25562 Pain in left knee: Secondary | ICD-10-CM

## 2013-12-13 DIAGNOSIS — M25569 Pain in unspecified knee: Secondary | ICD-10-CM | POA: Insufficient documentation

## 2013-12-17 ENCOUNTER — Encounter: Payer: Self-pay | Admitting: Emergency Medicine

## 2013-12-17 ENCOUNTER — Ambulatory Visit (INDEPENDENT_AMBULATORY_CARE_PROVIDER_SITE_OTHER): Payer: BC Managed Care – PPO | Admitting: Emergency Medicine

## 2013-12-17 ENCOUNTER — Other Ambulatory Visit: Payer: Self-pay | Admitting: *Deleted

## 2013-12-17 VITALS — BP 138/80 | HR 82 | Temp 98.2°F | Resp 18 | Ht 66.0 in | Wt 202.0 lb

## 2013-12-17 DIAGNOSIS — M25561 Pain in right knee: Secondary | ICD-10-CM

## 2013-12-17 DIAGNOSIS — M25569 Pain in unspecified knee: Secondary | ICD-10-CM

## 2013-12-17 DIAGNOSIS — M25562 Pain in left knee: Principal | ICD-10-CM

## 2013-12-17 DIAGNOSIS — R030 Elevated blood-pressure reading, without diagnosis of hypertension: Secondary | ICD-10-CM

## 2013-12-17 MED ORDER — OMEPRAZOLE 20 MG PO CPDR: 20.0000 mg | DELAYED_RELEASE_CAPSULE | Freq: Every day | ORAL | Status: DC

## 2013-12-17 NOTE — Progress Notes (Signed)
Subjective:    Patient ID: Vanessa French, female    DOB: Aug 28, 1951, 63 y.o.   MRN: [id number]  HPI Comments: 63 yo female with bilateral knee pain, left > right, that has been increasing over last several weeks. She has HX of Psoriasis followed by dermatology and is on Methotrexate. She has been trying Tylenol and Voltaren gel infrequently without much success. She notes + pain increase with change of position or prolonged standing. She admits to swelling, popping mostly in left. She denies any injury.  EXAM: LEFT KNEE - 1-2 VIEW   COMPARISON:  None.   FINDINGS: No fracture or dislocation. Mild tricompartmental degenerative change, likely worse within in the patellar femoral joint with joint space loss, articular surface irregularity and sclerosis. There is spurring of the tibial spines. No evidence of chondrocalcinosis. No joint effusion. Several small ossicles are noted above the cranial aspect of the tibial tuberosity. There is minimal avulsive change about the cranial aspect of the insertion of the interosseous membrane. Minimal enthesopathic change of the superior pole of the patella. Regional soft tissues appeared normal.   IMPRESSION: 1. No acute findings. 2. Mild tricompartmental degenerative change of the knee. 3. Sequela of presumed prior Osgood-Schlatter's disease.    EXAM: RIGHT KNEE - 1-2 VIEW   COMPARISON:  None.   FINDINGS: No fracture or dislocation. Mild tricompartmental degenerative change, likely worse within the medial compartment and patellar femoral joints with joint space loss, subchondral sclerosis and osteophytosis. There is minimal enthesopathic change about the superior and inferior poles of the patella and about the tibial tuberosity. No definite joint effusion. No evidence of chondrocalcinosis. Linear bands of increased sclerosis are identified within the distal femoral diaphysis as well as the proximal tibial metaphysis and favored to  represent growth arrest lines. Regional soft tissues are normal.   IMPRESSION: 1. No acute findings. 2. Mild degenerative change of the knee.  Patient notes BP up with coffee and stress this a.m. But normally OK. She denies any HX of HTN other than white coat episodes.      Knee Pain    Current Outpatient Prescriptions on File Prior to Visit  Medication Sig Dispense Refill  . calcipotriene (DOVONOX) 0.005 % cream Apply 1 application topically at bedtime.      . calcium carbonate (TUMS EX) 750 MG chewable tablet Chew 1 tablet by mouth daily.      . Cholecalciferol (VITAMIN D-3) 5000 UNITS TABS Take 5,000 Units by mouth 2 (two) times daily.      Marland Kitchen CINNAMON PO Take 1,000 mg by mouth 2 (two) times daily.      . fluticasone (CUTIVATE) 0.05 % cream Apply 1 application topically 2 (two) times daily.      Marland Kitchen FOLIC ACID PO Take 1 tablet by mouth daily.      . methotrexate (RHEUMATREX) 10 MG tablet Take 10 mg by mouth once a week. On Fridays   Caution: Chemotherapy. Protect from light.      . Multiple Vitamin (MULTI-VITAMIN DAILY PO) Take 1 tablet by mouth daily.      Marland Kitchen omeprazole (PRILOSEC) 20 MG capsule Take 20 mg by mouth daily.      . sertraline (ZOLOFT) 25 MG tablet Take 25 mg by mouth daily.      . solifenacin (VESICARE) 5 MG tablet Take 5 mg by mouth daily.       . vitamin C (ASCORBIC ACID) 500 MG tablet Take 500 mg by mouth 2 (two) times daily.  No current facility-administered medications on file prior to visit.   Allergies  Allergen Reactions  . Penicillins Other (See Comments)    Yeast infection  . Latex Rash   Past Medical History  Diagnosis Date  . GERD (gastroesophageal reflux disease)   . Psoriasis   . Frequent UTI       Review of Systems  Musculoskeletal: Positive for arthralgias.  All other systems reviewed and are negative.  BP 138/80  Pulse 82  Temp(Src) 98.2 F (36.8 C) (Temporal)  Resp 18  Ht 5\' 6"  (1.676 m)  Wt 202 lb (91.627 kg)  BMI 32.62  kg/m2      Objective:   Physical Exam  Nursing note and vitals reviewed. Constitutional: She is oriented to person, place, and time. She appears well-developed and well-nourished.  HENT:  Head: Normocephalic.  Neck: Normal range of motion. Neck supple.  Cardiovascular: Normal rate, regular rhythm, normal heart sounds and intact distal pulses.   Pulmonary/Chest: Effort normal and breath sounds normal.  Musculoskeletal: Normal range of motion. She exhibits edema and tenderness.  + crepitus bil Knee + anterior/ superior edema left  Neurological: She is alert and oriented to person, place, and time. She has normal reflexes. No cranial nerve deficit. Coordination normal.  Skin: Skin is warm and dry. No rash noted. No erythema.  Psychiatric: She has a normal mood and affect. Judgment and thought content normal.          Assessment & Plan:  1. Knee pain- With + hx get MRI to evaluate and consider Ortho refer 2. Elevated BP w/o HTN- Check BP call if >130/80, increase cardio

## 2013-12-19 ENCOUNTER — Ambulatory Visit
Admission: RE | Admit: 2013-12-19 | Discharge: 2013-12-19 | Disposition: A | Discharge status: Home / Self Care | Payer: BC Managed Care – PPO | Source: Ambulatory Visit | Attending: Emergency Medicine | Admitting: Emergency Medicine

## 2013-12-19 DIAGNOSIS — M25562 Pain in left knee: Principal | ICD-10-CM

## 2013-12-19 DIAGNOSIS — M25561 Pain in right knee: Secondary | ICD-10-CM

## 2013-12-22 ENCOUNTER — Other Ambulatory Visit: Payer: BC Managed Care – PPO

## 2014-01-25 ENCOUNTER — Other Ambulatory Visit: Payer: Self-pay | Admitting: Emergency Medicine

## 2014-01-25 MED ORDER — SERTRALINE HCL 25 MG PO TABS: 25.0000 mg | ORAL_TABLET | Freq: Every day | ORAL | Status: DC

## 2014-02-06 ENCOUNTER — Other Ambulatory Visit: Payer: Self-pay | Admitting: Physician Assistant

## 2014-02-06 ENCOUNTER — Other Ambulatory Visit: Payer: BLUE CROSS/BLUE SHIELD

## 2014-02-06 DIAGNOSIS — Z79899 Other long term (current) drug therapy: Secondary | ICD-10-CM

## 2014-02-06 DIAGNOSIS — L408 Other psoriasis: Secondary | ICD-10-CM

## 2014-02-06 LAB — CBC WITH DIFFERENTIAL/PLATELET
BASOS PCT: 5 % — AB (ref 0–1)
Basophils Absolute: 0.6 10*3/uL — ABNORMAL HIGH (ref 0.0–0.1)
EOS ABS: 0.2 10*3/uL (ref 0.0–0.7)
EOS PCT: 2 % (ref 0–5)
HEMATOCRIT: 42.7 % (ref 36.0–46.0)
HEMOGLOBIN: 14.5 g/dL (ref 12.0–15.0)
LYMPHS ABS: 3 10*3/uL (ref 0.7–4.0)
Lymphocytes Relative: 27 % (ref 12–46)
MCH: 30.3 pg (ref 26.0–34.0)
MCHC: 34 g/dL (ref 30.0–36.0)
MCV: 89.3 fL (ref 78.0–100.0)
MONO ABS: 1.1 10*3/uL — AB (ref 0.1–1.0)
MONOS PCT: 10 % (ref 3–12)
Neutro Abs: 6.3 10*3/uL (ref 1.7–7.7)
Neutrophils Relative %: 56 % (ref 43–77)
Platelets: 214 10*3/uL (ref 150–400)
RBC: 4.78 MIL/uL (ref 3.87–5.11)
RDW: 14.6 % (ref 11.5–15.5)
WBC: 11.2 10*3/uL — ABNORMAL HIGH (ref 4.0–10.5)

## 2014-02-06 LAB — HEPATIC FUNCTION PANEL
ALBUMIN: 4 g/dL (ref 3.5–5.2)
ALK PHOS: 77 U/L (ref 39–117)
ALT: 14 U/L (ref 0–35)
AST: 13 U/L (ref 0–37)
BILIRUBIN TOTAL: 0.5 mg/dL (ref 0.2–1.2)
Bilirubin, Direct: 0.1 mg/dL (ref 0.0–0.3)
Indirect Bilirubin: 0.4 mg/dL (ref 0.2–1.2)
TOTAL PROTEIN: 7.5 g/dL (ref 6.0–8.3)

## 2014-02-15 ENCOUNTER — Other Ambulatory Visit: Payer: Self-pay | Admitting: Internal Medicine

## 2014-02-15 ENCOUNTER — Other Ambulatory Visit: Payer: BLUE CROSS/BLUE SHIELD

## 2014-02-15 DIAGNOSIS — N3000 Acute cystitis without hematuria: Secondary | ICD-10-CM

## 2014-02-15 DIAGNOSIS — R300 Dysuria: Secondary | ICD-10-CM

## 2014-02-15 LAB — URINALYSIS, MICROSCOPIC ONLY
Casts: NONE SEEN
RBC / HPF: >50 RBC/hpf — AB (ref <3)
Squamous Epithelial / LPF: NONE SEEN

## 2014-02-17 LAB — URINE CULTURE

## 2014-02-18 ENCOUNTER — Other Ambulatory Visit: Payer: Self-pay | Admitting: Internal Medicine

## 2014-02-18 DIAGNOSIS — N3000 Acute cystitis without hematuria: Secondary | ICD-10-CM

## 2014-02-18 MED ORDER — CIPROFLOXACIN HCL 500 MG PO TABS: 500.0000 mg | ORAL_TABLET | Freq: Two times a day (BID) | ORAL | Status: DC

## 2014-02-20 ENCOUNTER — Other Ambulatory Visit: Payer: Self-pay | Admitting: Emergency Medicine

## 2014-02-20 MED ORDER — HYDROCHLOROTHIAZIDE 25 MG PO TABS: 25.0000 mg | ORAL_TABLET | Freq: Every day | ORAL | Status: DC

## 2014-02-22 ENCOUNTER — Other Ambulatory Visit: Payer: Self-pay

## 2014-02-22 DIAGNOSIS — Z1231 Encounter for screening mammogram for malignant neoplasm of breast: Secondary | ICD-10-CM

## 2014-02-28 ENCOUNTER — Other Ambulatory Visit: Payer: Self-pay | Admitting: Internal Medicine

## 2014-02-28 MED ORDER — HYDROCODONE-ACETAMINOPHEN 5-325 MG PO TABS: ORAL_TABLET | ORAL | Status: AC

## 2014-02-28 MED ORDER — PREDNISONE 20 MG PO TABS: 20.0000 mg | ORAL_TABLET | ORAL | Status: DC

## 2014-03-15 ENCOUNTER — Ambulatory Visit (INDEPENDENT_AMBULATORY_CARE_PROVIDER_SITE_OTHER): Payer: BC Managed Care – PPO | Admitting: Emergency Medicine

## 2014-03-15 ENCOUNTER — Encounter: Payer: Self-pay | Admitting: Emergency Medicine

## 2014-03-15 ENCOUNTER — Ambulatory Visit: Payer: BC Managed Care – PPO

## 2014-03-15 VITALS — BP 124/82 | HR 94 | Temp 97.3°F | Resp 18

## 2014-03-15 DIAGNOSIS — R6889 Other general symptoms and signs: Secondary | ICD-10-CM

## 2014-03-15 DIAGNOSIS — R899 Unspecified abnormal finding in specimens from other organs, systems and tissues: Secondary | ICD-10-CM

## 2014-03-15 DIAGNOSIS — N390 Urinary tract infection, site not specified: Secondary | ICD-10-CM

## 2014-03-15 DIAGNOSIS — J029 Acute pharyngitis, unspecified: Secondary | ICD-10-CM

## 2014-03-15 LAB — CBC WITH DIFFERENTIAL/PLATELET
BASOS PCT: 1 % (ref 0–1)
Basophils Absolute: 0.1 10*3/uL (ref 0.0–0.1)
EOS ABS: 0.3 10*3/uL (ref 0.0–0.7)
Eosinophils Relative: 3 % (ref 0–5)
HCT: 41.3 % (ref 36.0–46.0)
Hemoglobin: 14.2 g/dL (ref 12.0–15.0)
LYMPHS ABS: 2.1 10*3/uL (ref 0.7–4.0)
Lymphocytes Relative: 23 % (ref 12–46)
MCH: 30.1 pg (ref 26.0–34.0)
MCHC: 34.4 g/dL (ref 30.0–36.0)
MCV: 87.5 fL (ref 78.0–100.0)
Monocytes Absolute: 0.7 10*3/uL (ref 0.1–1.0)
Monocytes Relative: 8 % (ref 3–12)
NEUTROS ABS: 5.9 10*3/uL (ref 1.7–7.7)
NEUTROS PCT: 65 % (ref 43–77)
PLATELETS: 218 10*3/uL (ref 150–400)
RBC: 4.72 MIL/uL (ref 3.87–5.11)
RDW: 14.8 % (ref 11.5–15.5)
WBC: 9.1 10*3/uL (ref 4.0–10.5)

## 2014-03-15 MED ORDER — AZITHROMYCIN 250 MG PO TABS: ORAL_TABLET | ORAL | Status: AC

## 2014-03-15 NOTE — Progress Notes (Signed)
   Subjective:    Patient ID: Vanessa French, female    DOB: 01-Jan-1951, 63 y.o.   MRN: [id number]  HPI Comments: 63 yo WF with recurrent UTI needs recheck. SHe denies any UTI symptoms.   She has been more fatigued over last week. She had fever Sunday and Monday with headache. She has yellow cough production on/off. She now has sore throat and lypmh nodes tender. She has been taking OTC for fever/ Cold/ Allergy.  Sore Throat  Associated symptoms include congestion and coughing.  Cough Associated symptoms include a fever, postnasal drip and a sore throat.      Review of Systems  Constitutional: Positive for fever and fatigue.  HENT: Positive for congestion, postnasal drip and sore throat.   Respiratory: Positive for cough.   All other systems reviewed and are negative. BP 124/82  Pulse 94  Temp(Src) 97.3 F (36.3 C) (Temporal)  Resp 18       Objective:   Physical Exam  Nursing note and vitals reviewed. Constitutional: She is oriented to person, place, and time. She appears well-developed and well-nourished.  HENT:  Head: Normocephalic and atraumatic.  Right Ear: External ear normal.  Left Ear: External ear normal.  Nose: Nose normal.  Mouth/Throat: No oropharyngeal exudate.  + erythema post pharynx  Eyes: Conjunctivae and EOM are normal.  Neck: Normal range of motion.  Cardiovascular: Normal rate, regular rhythm, normal heart sounds and intact distal pulses.   Pulmonary/Chest: Effort normal and breath sounds normal.  Abdominal: Soft. Bowel sounds are normal. She exhibits no distension. There is no tenderness.  Musculoskeletal: Normal range of motion.  Lymphadenopathy:    She has cervical adenopathy.  Neurological: She is alert and oriented to person, place, and time.  Skin: Skin is warm and dry.  Psychiatric: She has a normal mood and affect. Judgment normal.          Assessment & Plan:  1.  Pharyngitis/ Allergic rhinitis- Allegra OTC, increase H2o, allergy  hygiene explained. - Zpak AD  2.  UTI recent- Recheck labs, push H2O Hygiene explained

## 2014-03-16 LAB — URINALYSIS, ROUTINE W REFLEX MICROSCOPIC
BILIRUBIN URINE: NEGATIVE
Glucose, UA: NEGATIVE mg/dL
Hgb urine dipstick: NEGATIVE
KETONES UR: NEGATIVE mg/dL
Leukocytes, UA: NEGATIVE
Nitrite: NEGATIVE
PROTEIN: NEGATIVE mg/dL
Specific Gravity, Urine: 1.01 (ref 1.005–1.030)
UROBILINOGEN UA: 0.2 mg/dL (ref 0.0–1.0)
pH: 7 (ref 5.0–8.0)

## 2014-03-16 LAB — URINE CULTURE
COLONY COUNT: NO GROWTH
Organism ID, Bacteria: NO GROWTH

## 2014-03-21 ENCOUNTER — Other Ambulatory Visit: Payer: Self-pay | Admitting: Emergency Medicine

## 2014-03-21 DIAGNOSIS — N390 Urinary tract infection, site not specified: Secondary | ICD-10-CM

## 2014-03-21 DIAGNOSIS — R300 Dysuria: Secondary | ICD-10-CM

## 2014-03-22 ENCOUNTER — Other Ambulatory Visit (INDEPENDENT_AMBULATORY_CARE_PROVIDER_SITE_OTHER): Payer: BC Managed Care – PPO

## 2014-03-22 DIAGNOSIS — N3000 Acute cystitis without hematuria: Secondary | ICD-10-CM

## 2014-03-22 DIAGNOSIS — N390 Urinary tract infection, site not specified: Secondary | ICD-10-CM

## 2014-03-22 DIAGNOSIS — R300 Dysuria: Secondary | ICD-10-CM

## 2014-03-23 LAB — URINALYSIS, ROUTINE W REFLEX MICROSCOPIC
BILIRUBIN URINE: NEGATIVE
GLUCOSE, UA: NEGATIVE mg/dL
Ketones, ur: NEGATIVE mg/dL
Nitrite: POSITIVE — AB
PROTEIN: NEGATIVE mg/dL
SPECIFIC GRAVITY, URINE: 1.013 (ref 1.005–1.030)
Urobilinogen, UA: 0.2 mg/dL (ref 0.0–1.0)
pH: 6.5 (ref 5.0–8.0)

## 2014-03-23 LAB — URINALYSIS, MICROSCOPIC ONLY
Bacteria, UA: NONE SEEN
CRYSTALS: NONE SEEN
Casts: NONE SEEN
SQUAMOUS EPITHELIAL / LPF: NONE SEEN

## 2014-03-25 ENCOUNTER — Other Ambulatory Visit: Payer: Self-pay | Admitting: Emergency Medicine

## 2014-03-25 LAB — URINE CULTURE: Colony Count: 10000

## 2014-03-25 MED ORDER — CEPHALEXIN 250 MG PO CAPS: 250.0000 mg | ORAL_CAPSULE | Freq: Four times a day (QID) | ORAL | Status: AC

## 2014-03-25 MED ORDER — FLUCONAZOLE 150 MG PO TABS: 150.0000 mg | ORAL_TABLET | Freq: Every day | ORAL | Status: DC

## 2014-03-25 MED ORDER — NITROFURANTOIN MONOHYD MACRO 100 MG PO CAPS: 100.0000 mg | ORAL_CAPSULE | Freq: Two times a day (BID) | ORAL | Status: AC

## 2014-03-29 ENCOUNTER — Encounter (INDEPENDENT_AMBULATORY_CARE_PROVIDER_SITE_OTHER): Payer: Self-pay

## 2014-03-29 ENCOUNTER — Ambulatory Visit
Admission: RE | Admit: 2014-03-29 | Discharge: 2014-03-29 | Disposition: A | Discharge status: Home / Self Care | Payer: BC Managed Care – PPO | Source: Ambulatory Visit

## 2014-03-29 DIAGNOSIS — Z1231 Encounter for screening mammogram for malignant neoplasm of breast: Secondary | ICD-10-CM

## 2014-05-13 ENCOUNTER — Other Ambulatory Visit (HOSPITAL_COMMUNITY): Payer: Self-pay | Admitting: Orthopaedic Surgery

## 2014-05-13 DIAGNOSIS — M1710 Unilateral primary osteoarthritis, unspecified knee: Secondary | ICD-10-CM

## 2014-05-24 ENCOUNTER — Ambulatory Visit (HOSPITAL_COMMUNITY)
Admission: RE | Admit: 2014-05-24 | Discharge: 2014-05-24 | Disposition: A | Discharge status: Home / Self Care | Payer: BC Managed Care – PPO | Source: Ambulatory Visit | Attending: Orthopaedic Surgery | Admitting: Orthopaedic Surgery

## 2014-05-24 DIAGNOSIS — Z780 Asymptomatic menopausal state: Secondary | ICD-10-CM | POA: Insufficient documentation

## 2014-05-24 DIAGNOSIS — Z13820 Encounter for screening for osteoporosis: Secondary | ICD-10-CM | POA: Insufficient documentation

## 2014-05-24 DIAGNOSIS — M1710 Unilateral primary osteoarthritis, unspecified knee: Secondary | ICD-10-CM

## 2014-06-10 DIAGNOSIS — E559 Vitamin D deficiency, unspecified: Secondary | ICD-10-CM

## 2014-06-24 ENCOUNTER — Other Ambulatory Visit (INDEPENDENT_AMBULATORY_CARE_PROVIDER_SITE_OTHER): Payer: BC Managed Care – PPO

## 2014-06-24 DIAGNOSIS — N390 Urinary tract infection, site not specified: Secondary | ICD-10-CM

## 2014-06-24 LAB — URINALYSIS, ROUTINE W REFLEX MICROSCOPIC
Bilirubin Urine: NEGATIVE
GLUCOSE, UA: NEGATIVE mg/dL
Hgb urine dipstick: NEGATIVE
KETONES UR: NEGATIVE mg/dL
Leukocytes, UA: NEGATIVE
Nitrite: NEGATIVE
PH: 7 (ref 5.0–8.0)
Protein, ur: NEGATIVE mg/dL
SPECIFIC GRAVITY, URINE: 1.016 (ref 1.005–1.030)
Urobilinogen, UA: 0.2 mg/dL (ref 0.0–1.0)

## 2014-06-25 LAB — URINE CULTURE
Colony Count: NO GROWTH
ORGANISM ID, BACTERIA: NO GROWTH

## 2014-07-23 ENCOUNTER — Other Ambulatory Visit: Payer: BLUE CROSS/BLUE SHIELD

## 2014-07-23 DIAGNOSIS — N3000 Acute cystitis without hematuria: Secondary | ICD-10-CM

## 2014-07-24 LAB — URINALYSIS, ROUTINE W REFLEX MICROSCOPIC
BILIRUBIN URINE: NEGATIVE
Glucose, UA: NEGATIVE mg/dL
Ketones, ur: NEGATIVE mg/dL
Nitrite: POSITIVE — AB
PROTEIN: NEGATIVE mg/dL
Specific Gravity, Urine: 1.01 (ref 1.005–1.030)
UROBILINOGEN UA: 0.2 mg/dL (ref 0.0–1.0)
pH: 6.5 (ref 5.0–8.0)

## 2014-07-24 LAB — URINALYSIS, MICROSCOPIC ONLY
Casts: NONE SEEN
Crystals: NONE SEEN
SQUAMOUS EPITHELIAL / LPF: NONE SEEN

## 2014-07-25 LAB — URINE CULTURE

## 2014-07-30 ENCOUNTER — Other Ambulatory Visit: Payer: Self-pay | Admitting: Physician Assistant

## 2014-07-30 DIAGNOSIS — L409 Psoriasis, unspecified: Secondary | ICD-10-CM

## 2014-07-30 DIAGNOSIS — Z111 Encounter for screening for respiratory tuberculosis: Secondary | ICD-10-CM

## 2014-07-30 DIAGNOSIS — E559 Vitamin D deficiency, unspecified: Secondary | ICD-10-CM

## 2014-07-30 DIAGNOSIS — E785 Hyperlipidemia, unspecified: Secondary | ICD-10-CM

## 2014-07-30 DIAGNOSIS — R030 Elevated blood-pressure reading, without diagnosis of hypertension: Secondary | ICD-10-CM

## 2014-07-31 ENCOUNTER — Other Ambulatory Visit (INDEPENDENT_AMBULATORY_CARE_PROVIDER_SITE_OTHER): Payer: BC Managed Care – PPO

## 2014-07-31 DIAGNOSIS — R030 Elevated blood-pressure reading, without diagnosis of hypertension: Secondary | ICD-10-CM

## 2014-07-31 DIAGNOSIS — Z111 Encounter for screening for respiratory tuberculosis: Secondary | ICD-10-CM

## 2014-07-31 DIAGNOSIS — E559 Vitamin D deficiency, unspecified: Secondary | ICD-10-CM

## 2014-07-31 DIAGNOSIS — I10 Essential (primary) hypertension: Secondary | ICD-10-CM

## 2014-07-31 DIAGNOSIS — E785 Hyperlipidemia, unspecified: Secondary | ICD-10-CM

## 2014-07-31 LAB — COMPREHENSIVE METABOLIC PANEL
ALBUMIN: 4 g/dL (ref 3.5–5.2)
ALT: 15 U/L (ref 0–35)
AST: 17 U/L (ref 0–37)
Alkaline Phosphatase: 62 U/L (ref 39–117)
BUN: 9 mg/dL (ref 6–23)
CO2: 27 mEq/L (ref 19–32)
Calcium: 9.3 mg/dL (ref 8.4–10.5)
Chloride: 103 mEq/L (ref 96–112)
Creat: 0.62 mg/dL (ref 0.50–1.10)
Glucose, Bld: 109 mg/dL — ABNORMAL HIGH (ref 70–99)
POTASSIUM: 4 meq/L (ref 3.5–5.3)
SODIUM: 142 meq/L (ref 135–145)
Total Bilirubin: 0.7 mg/dL (ref 0.2–1.2)
Total Protein: 7.5 g/dL (ref 6.0–8.3)

## 2014-07-31 LAB — CBC WITH DIFFERENTIAL/PLATELET
BASOS ABS: 0.1 10*3/uL (ref 0.0–0.1)
Basophils Relative: 1 % (ref 0–1)
Eosinophils Absolute: 0.2 10*3/uL (ref 0.0–0.7)
Eosinophils Relative: 3 % (ref 0–5)
HEMATOCRIT: 43.8 % (ref 36.0–46.0)
Hemoglobin: 14.8 g/dL (ref 12.0–15.0)
LYMPHS PCT: 28 % (ref 12–46)
Lymphs Abs: 1.6 10*3/uL (ref 0.7–4.0)
MCH: 30.3 pg (ref 26.0–34.0)
MCHC: 33.8 g/dL (ref 30.0–36.0)
MCV: 89.6 fL (ref 78.0–100.0)
MONO ABS: 0.5 10*3/uL (ref 0.1–1.0)
Monocytes Relative: 9 % (ref 3–12)
NEUTROS ABS: 3.4 10*3/uL (ref 1.7–7.7)
NEUTROS PCT: 59 % (ref 43–77)
Platelets: 234 10*3/uL (ref 150–400)
RBC: 4.89 MIL/uL (ref 3.87–5.11)
RDW: 14.8 % (ref 11.5–15.5)
WBC: 5.8 10*3/uL (ref 4.0–10.5)

## 2014-07-31 LAB — LIPID PANEL
Cholesterol: 184 mg/dL (ref 0–200)
HDL: 60 mg/dL (ref >39)
LDL CALC: 107 mg/dL — AB (ref 0–99)
Total CHOL/HDL Ratio: 3.1 Ratio
Triglycerides: 84 mg/dL (ref <150)
VLDL: 17 mg/dL (ref 0–40)

## 2014-07-31 LAB — TSH: TSH: 1.253 u[IU]/mL (ref 0.350–4.500)

## 2014-08-01 LAB — VITAMIN D 25 HYDROXY (VIT D DEFICIENCY, FRACTURES): Vit D, 25-Hydroxy: 114 ng/mL — ABNORMAL HIGH (ref 30–89)

## 2014-08-02 LAB — TB SKIN TEST
INDURATION: 0 mm
TB SKIN TEST: NEGATIVE

## 2014-08-12 ENCOUNTER — Ambulatory Visit (INDEPENDENT_AMBULATORY_CARE_PROVIDER_SITE_OTHER): Payer: BC Managed Care – PPO | Admitting: *Deleted

## 2014-08-12 DIAGNOSIS — Z23 Encounter for immunization: Secondary | ICD-10-CM

## 2014-08-22 ENCOUNTER — Other Ambulatory Visit: Payer: BLUE CROSS/BLUE SHIELD

## 2014-08-22 DIAGNOSIS — R300 Dysuria: Secondary | ICD-10-CM

## 2014-08-23 LAB — URINALYSIS, MICROSCOPIC ONLY
CASTS: NONE SEEN
CRYSTALS: NONE SEEN
Squamous Epithelial / LPF: NONE SEEN
WBC, UA: >50 WBC/hpf — AB (ref <3)

## 2014-08-23 LAB — URINALYSIS, ROUTINE W REFLEX MICROSCOPIC
BILIRUBIN URINE: NEGATIVE
Glucose, UA: NEGATIVE mg/dL
Hgb urine dipstick: NEGATIVE
Ketones, ur: NEGATIVE mg/dL
NITRITE: POSITIVE — AB
PROTEIN: NEGATIVE mg/dL
Specific Gravity, Urine: 1.013 (ref 1.005–1.030)
Urobilinogen, UA: 0.2 mg/dL (ref 0.0–1.0)
pH: 7 (ref 5.0–8.0)

## 2014-08-27 LAB — URINE CULTURE: Colony Count: >=100000

## 2014-09-16 ENCOUNTER — Other Ambulatory Visit: Payer: Self-pay

## 2014-09-16 ENCOUNTER — Other Ambulatory Visit: Payer: Self-pay | Admitting: Emergency Medicine

## 2014-09-16 DIAGNOSIS — N390 Urinary tract infection, site not specified: Secondary | ICD-10-CM

## 2014-09-16 MED ORDER — HYDROCHLOROTHIAZIDE 25 MG PO TABS: 25.0000 mg | ORAL_TABLET | Freq: Every day | ORAL | Status: DC

## 2014-09-17 LAB — URINALYSIS, COMPLETE
Bilirubin Urine: NEGATIVE
CASTS: NONE SEEN
Crystals: NONE SEEN
GLUCOSE, UA: NEGATIVE mg/dL
HGB URINE DIPSTICK: NEGATIVE
KETONES UR: NEGATIVE mg/dL
Leukocytes, UA: NEGATIVE
Nitrite: NEGATIVE
PH: 7 (ref 5.0–8.0)
PROTEIN: NEGATIVE mg/dL
Specific Gravity, Urine: 1.018 (ref 1.005–1.030)
Squamous Epithelial / LPF: NONE SEEN
Urobilinogen, UA: 0.2 mg/dL (ref 0.0–1.0)

## 2014-09-18 LAB — URINE CULTURE: Colony Count: >=100000

## 2014-09-19 ENCOUNTER — Other Ambulatory Visit: Payer: Self-pay | Admitting: Internal Medicine

## 2014-09-19 MED ORDER — LEVOFLOXACIN 500 MG PO TABS: ORAL_TABLET | ORAL | Status: AC

## 2014-10-25 HISTORY — PX: CATARACT EXTRACTION, BILATERAL: SHX1313

## 2014-11-08 ENCOUNTER — Other Ambulatory Visit: Payer: Self-pay | Admitting: Emergency Medicine

## 2014-11-08 DIAGNOSIS — I83029 Varicose veins of left lower extremity with ulcer of unspecified site: Secondary | ICD-10-CM

## 2014-11-08 DIAGNOSIS — M25562 Pain in left knee: Secondary | ICD-10-CM

## 2014-11-08 DIAGNOSIS — L97929 Non-pressure chronic ulcer of unspecified part of left lower leg with unspecified severity: Secondary | ICD-10-CM

## 2014-11-08 NOTE — Progress Notes (Unsigned)
Patient notes large varicose veins on both legs but increased pain in left posterior knee without injury. Advised U/S to verify  For DVT/ Bakers cyst vs phlebitis.

## 2014-11-11 ENCOUNTER — Ambulatory Visit
Admission: RE | Admit: 2014-11-11 | Discharge: 2014-11-11 | Disposition: A | Discharge status: Home / Self Care | Payer: BLUE CROSS/BLUE SHIELD | Source: Ambulatory Visit | Attending: Emergency Medicine | Admitting: Emergency Medicine

## 2014-11-11 DIAGNOSIS — M25562 Pain in left knee: Secondary | ICD-10-CM

## 2014-11-11 DIAGNOSIS — L97929 Non-pressure chronic ulcer of unspecified part of left lower leg with unspecified severity: Secondary | ICD-10-CM

## 2014-11-11 DIAGNOSIS — I83029 Varicose veins of left lower extremity with ulcer of unspecified site: Secondary | ICD-10-CM

## 2015-01-23 ENCOUNTER — Other Ambulatory Visit: Payer: Self-pay | Admitting: Physician Assistant

## 2015-01-23 ENCOUNTER — Other Ambulatory Visit: Payer: BLUE CROSS/BLUE SHIELD

## 2015-01-23 DIAGNOSIS — N390 Urinary tract infection, site not specified: Secondary | ICD-10-CM

## 2015-01-23 DIAGNOSIS — R300 Dysuria: Secondary | ICD-10-CM

## 2015-01-23 DIAGNOSIS — Z79899 Other long term (current) drug therapy: Secondary | ICD-10-CM

## 2015-01-23 DIAGNOSIS — I10 Essential (primary) hypertension: Secondary | ICD-10-CM

## 2015-01-23 LAB — HEPATIC FUNCTION PANEL
ALK PHOS: 59 U/L (ref 39–117)
ALT: 12 U/L (ref 0–35)
AST: 13 U/L (ref 0–37)
Albumin: 3.9 g/dL (ref 3.5–5.2)
BILIRUBIN TOTAL: 0.6 mg/dL (ref 0.2–1.2)
Bilirubin, Direct: 0.1 mg/dL (ref 0.0–0.3)
Indirect Bilirubin: 0.5 mg/dL (ref 0.2–1.2)
Total Protein: 7.1 g/dL (ref 6.0–8.3)

## 2015-01-23 LAB — CBC WITH DIFFERENTIAL/PLATELET
BASOS PCT: 0 % (ref 0–1)
Basophils Absolute: 0 10*3/uL (ref 0.0–0.1)
Eosinophils Absolute: 0.2 10*3/uL (ref 0.0–0.7)
Eosinophils Relative: 2 % (ref 0–5)
HEMATOCRIT: 43.1 % (ref 36.0–46.0)
HEMOGLOBIN: 14.8 g/dL (ref 12.0–15.0)
Lymphocytes Relative: 21 % (ref 12–46)
Lymphs Abs: 1.8 10*3/uL (ref 0.7–4.0)
MCH: 30.6 pg (ref 26.0–34.0)
MCHC: 34.3 g/dL (ref 30.0–36.0)
MCV: 89.2 fL (ref 78.0–100.0)
MONO ABS: 0.8 10*3/uL (ref 0.1–1.0)
MONOS PCT: 9 % (ref 3–12)
MPV: 9.4 fL (ref 8.6–12.4)
NEUTROS PCT: 68 % (ref 43–77)
Neutro Abs: 5.7 10*3/uL (ref 1.7–7.7)
Platelets: 233 10*3/uL (ref 150–400)
RBC: 4.83 MIL/uL (ref 3.87–5.11)
RDW: 14.7 % (ref 11.5–15.5)
WBC: 8.4 10*3/uL (ref 4.0–10.5)

## 2015-01-23 LAB — BASIC METABOLIC PANEL WITH GFR
BUN: 12 mg/dL (ref 6–23)
CHLORIDE: 101 meq/L (ref 96–112)
CO2: 30 mEq/L (ref 19–32)
Calcium: 8.9 mg/dL (ref 8.4–10.5)
Creat: 0.58 mg/dL (ref 0.50–1.10)
GLUCOSE: 97 mg/dL (ref 70–99)
Potassium: 3.9 mEq/L (ref 3.5–5.3)
Sodium: 140 mEq/L (ref 135–145)

## 2015-01-24 LAB — URINALYSIS, ROUTINE W REFLEX MICROSCOPIC
BILIRUBIN URINE: NEGATIVE
GLUCOSE, UA: NEGATIVE mg/dL
KETONES UR: NEGATIVE mg/dL
Nitrite: NEGATIVE
PROTEIN: 30 mg/dL — AB
Specific Gravity, Urine: 1.014 (ref 1.005–1.030)
UROBILINOGEN UA: 0.2 mg/dL (ref 0.0–1.0)
pH: 7.5 (ref 5.0–8.0)

## 2015-01-24 LAB — URINALYSIS, MICROSCOPIC ONLY
Casts: NONE SEEN
Crystals: NONE SEEN
Squamous Epithelial / LPF: NONE SEEN
WBC, UA: >50 WBC/hpf — AB (ref <3)

## 2015-01-25 LAB — URINE CULTURE: Colony Count: >=100000

## 2015-02-07 ENCOUNTER — Other Ambulatory Visit: Payer: Self-pay | Admitting: Internal Medicine

## 2015-02-07 DIAGNOSIS — R05 Cough: Secondary | ICD-10-CM

## 2015-02-07 DIAGNOSIS — R059 Cough, unspecified: Secondary | ICD-10-CM

## 2015-02-07 MED ORDER — HYDROCODONE-ACETAMINOPHEN 5-325 MG PO TABS: ORAL_TABLET | ORAL | Status: AC

## 2015-02-07 MED ORDER — MONTELUKAST SODIUM 10 MG PO TABS: ORAL_TABLET | ORAL | Status: DC

## 2015-03-13 ENCOUNTER — Other Ambulatory Visit: Payer: 59

## 2015-03-13 DIAGNOSIS — R350 Frequency of micturition: Secondary | ICD-10-CM

## 2015-03-14 LAB — URINALYSIS, ROUTINE W REFLEX MICROSCOPIC
Bilirubin Urine: NEGATIVE
Glucose, UA: NEGATIVE mg/dL
Hgb urine dipstick: NEGATIVE
Ketones, ur: NEGATIVE mg/dL
Nitrite: NEGATIVE
Protein, ur: NEGATIVE mg/dL
Specific Gravity, Urine: <1.005 — ABNORMAL LOW (ref 1.005–1.030)
Urobilinogen, UA: 0.2 mg/dL (ref 0.0–1.0)
pH: 7 (ref 5.0–8.0)

## 2015-03-14 LAB — URINALYSIS, MICROSCOPIC ONLY
CRYSTALS: NONE SEEN
Casts: NONE SEEN
SQUAMOUS EPITHELIAL / LPF: NONE SEEN

## 2015-03-16 LAB — URINE CULTURE: Colony Count: 75000

## 2015-03-21 ENCOUNTER — Other Ambulatory Visit: Payer: Self-pay

## 2015-03-21 DIAGNOSIS — Z1231 Encounter for screening mammogram for malignant neoplasm of breast: Secondary | ICD-10-CM

## 2015-04-11 ENCOUNTER — Ambulatory Visit
Admission: RE | Admit: 2015-04-11 | Discharge: 2015-04-11 | Disposition: A | Discharge status: Home / Self Care | Payer: 59 | Source: Ambulatory Visit

## 2015-04-11 DIAGNOSIS — Z1231 Encounter for screening mammogram for malignant neoplasm of breast: Secondary | ICD-10-CM

## 2015-05-05 ENCOUNTER — Other Ambulatory Visit: Payer: Self-pay | Admitting: *Deleted

## 2015-05-05 MED ORDER — SERTRALINE HCL 25 MG PO TABS: 25.0000 mg | ORAL_TABLET | Freq: Every day | ORAL | Status: DC

## 2015-05-05 MED ORDER — HYDROCHLOROTHIAZIDE 25 MG PO TABS: 25.0000 mg | ORAL_TABLET | Freq: Every day | ORAL | Status: DC

## 2015-05-16 ENCOUNTER — Other Ambulatory Visit: Payer: Self-pay

## 2015-05-16 MED ORDER — ALENDRONATE SODIUM 70 MG PO TABS: 70.0000 mg | ORAL_TABLET | ORAL | Status: DC

## 2015-07-01 ENCOUNTER — Other Ambulatory Visit: Payer: Self-pay | Admitting: Internal Medicine

## 2015-07-01 DIAGNOSIS — K589 Irritable bowel syndrome without diarrhea: Secondary | ICD-10-CM

## 2015-07-01 MED ORDER — HYOSCYAMINE SULFATE 0.125 MG PO TABS: ORAL_TABLET | ORAL | Status: DC

## 2015-07-25 ENCOUNTER — Other Ambulatory Visit: Payer: Self-pay | Admitting: Internal Medicine

## 2015-07-25 ENCOUNTER — Other Ambulatory Visit: Payer: 59

## 2015-07-25 DIAGNOSIS — Z79899 Other long term (current) drug therapy: Secondary | ICD-10-CM

## 2015-07-25 DIAGNOSIS — R300 Dysuria: Secondary | ICD-10-CM

## 2015-07-25 LAB — CBC WITH DIFFERENTIAL/PLATELET
BASOS ABS: 0 10*3/uL (ref 0.0–0.1)
Basophils Relative: 0 % (ref 0–1)
Eosinophils Absolute: 0.2 10*3/uL (ref 0.0–0.7)
Eosinophils Relative: 2 % (ref 0–5)
HEMATOCRIT: 41.5 % (ref 36.0–46.0)
HEMOGLOBIN: 14.1 g/dL (ref 12.0–15.0)
LYMPHS ABS: 1.5 10*3/uL (ref 0.7–4.0)
LYMPHS PCT: 18 % (ref 12–46)
MCH: 30.9 pg (ref 26.0–34.0)
MCHC: 34 g/dL (ref 30.0–36.0)
MCV: 91 fL (ref 78.0–100.0)
MPV: 9.6 fL (ref 8.6–12.4)
Monocytes Absolute: 0.6 10*3/uL (ref 0.1–1.0)
Monocytes Relative: 7 % (ref 3–12)
NEUTROS ABS: 6 10*3/uL (ref 1.7–7.7)
Neutrophils Relative %: 73 % (ref 43–77)
Platelets: 206 10*3/uL (ref 150–400)
RBC: 4.56 MIL/uL (ref 3.87–5.11)
RDW: 15.2 % (ref 11.5–15.5)
WBC: 8.2 10*3/uL (ref 4.0–10.5)

## 2015-07-25 LAB — HEPATIC FUNCTION PANEL
ALK PHOS: 62 U/L (ref 33–130)
ALT: 11 U/L (ref 6–29)
AST: 13 U/L (ref 10–35)
Albumin: 3.7 g/dL (ref 3.6–5.1)
BILIRUBIN DIRECT: 0.1 mg/dL (ref <=0.2)
BILIRUBIN INDIRECT: 0.5 mg/dL (ref 0.2–1.2)
TOTAL PROTEIN: 6.7 g/dL (ref 6.1–8.1)
Total Bilirubin: 0.6 mg/dL (ref 0.2–1.2)

## 2015-07-28 ENCOUNTER — Ambulatory Visit (INDEPENDENT_AMBULATORY_CARE_PROVIDER_SITE_OTHER): Payer: 59 | Admitting: *Deleted

## 2015-07-28 DIAGNOSIS — Z111 Encounter for screening for respiratory tuberculosis: Secondary | ICD-10-CM | POA: Diagnosis not present

## 2015-07-29 LAB — URINE CULTURE

## 2015-07-31 ENCOUNTER — Other Ambulatory Visit: Payer: 59

## 2015-07-31 DIAGNOSIS — Z79899 Other long term (current) drug therapy: Secondary | ICD-10-CM

## 2015-07-31 LAB — BASIC METABOLIC PANEL WITH GFR
BUN: 12 mg/dL (ref 7–25)
CALCIUM: 8.9 mg/dL (ref 8.6–10.4)
CO2: 30 mmol/L (ref 20–31)
Chloride: 103 mmol/L (ref 98–110)
Creat: 0.55 mg/dL (ref 0.50–0.99)
GFR, Est African American: >89 mL/min (ref >=60)
GLUCOSE: 112 mg/dL — AB (ref 65–99)
Potassium: 3.6 mmol/L (ref 3.5–5.3)
Sodium: 140 mmol/L (ref 135–146)

## 2015-07-31 LAB — TB SKIN TEST
Induration: 0 mm
TB Skin Test: NEGATIVE

## 2015-08-14 ENCOUNTER — Other Ambulatory Visit: Payer: Self-pay | Admitting: Internal Medicine

## 2015-08-14 ENCOUNTER — Other Ambulatory Visit: Payer: 59

## 2015-08-14 DIAGNOSIS — N3000 Acute cystitis without hematuria: Secondary | ICD-10-CM

## 2015-08-14 LAB — URINALYSIS, ROUTINE W REFLEX MICROSCOPIC
Bilirubin Urine: NEGATIVE
GLUCOSE, UA: NEGATIVE
Ketones, ur: NEGATIVE
Nitrite: NEGATIVE
PH: 7.5 (ref 5.0–8.0)
PROTEIN: NEGATIVE
Specific Gravity, Urine: 1.014 (ref 1.001–1.035)

## 2015-08-14 LAB — URINALYSIS, MICROSCOPIC ONLY
CASTS: NONE SEEN [LPF]
Crystals: NONE SEEN [HPF]
SQUAMOUS EPITHELIAL / LPF: NONE SEEN [HPF] (ref <=5)
WBC, UA: >60 WBC/HPF — AB (ref <=5)
YEAST: NONE SEEN [HPF]

## 2015-08-15 ENCOUNTER — Other Ambulatory Visit: Payer: Self-pay | Admitting: Internal Medicine

## 2015-08-15 MED ORDER — LEVOFLOXACIN 500 MG PO TABS
ORAL_TABLET | ORAL | Status: DC
Start: 2015-08-15 — End: 2015-08-23

## 2015-08-16 LAB — URINE CULTURE: Colony Count: 75000

## 2015-08-21 ENCOUNTER — Ambulatory Visit: Payer: 59 | Admitting: *Deleted

## 2015-08-21 DIAGNOSIS — Z23 Encounter for immunization: Secondary | ICD-10-CM

## 2015-08-23 ENCOUNTER — Other Ambulatory Visit: Payer: Self-pay | Admitting: Internal Medicine

## 2015-08-23 DIAGNOSIS — N3090 Cystitis, unspecified without hematuria: Secondary | ICD-10-CM

## 2015-08-23 MED ORDER — SULFAMETHOXAZOLE-TRIMETHOPRIM 800-160 MG PO TABS: ORAL_TABLET | ORAL | Status: AC

## 2015-08-23 MED ORDER — CEPHALEXIN 500 MG PO CAPS: ORAL_CAPSULE | ORAL | Status: AC

## 2015-08-23 MED ORDER — CIPROFLOXACIN HCL 500 MG PO TABS: ORAL_TABLET | ORAL | Status: AC

## 2015-08-23 NOTE — Progress Notes (Signed)
Because of frequent occurrence of lower UTI's, discussed with patient to alternate Abx's for prophylaxis as follows:   Mondays - Cipro 500 mg  Wednesdays - Keflex 500 mg  Fridays - Septra DS  & continue with Vit C 1,000 mg 2 x day to acidify urine

## 2015-10-08 ENCOUNTER — Other Ambulatory Visit: Payer: Self-pay | Admitting: Internal Medicine

## 2015-10-08 DIAGNOSIS — J0410 Acute tracheitis without obstruction: Secondary | ICD-10-CM

## 2015-10-08 MED ORDER — AZITHROMYCIN 250 MG PO TABS: ORAL_TABLET | ORAL | Status: DC

## 2015-10-29 ENCOUNTER — Other Ambulatory Visit: Payer: Self-pay | Admitting: Physician Assistant

## 2015-10-29 DIAGNOSIS — N644 Mastodynia: Secondary | ICD-10-CM

## 2015-10-31 ENCOUNTER — Other Ambulatory Visit: Payer: 59

## 2015-11-07 ENCOUNTER — Ambulatory Visit
Admission: RE | Admit: 2015-11-07 | Discharge: 2015-11-07 | Disposition: A | Discharge status: Home / Self Care | Payer: 59 | Source: Ambulatory Visit | Attending: Physician Assistant | Admitting: Physician Assistant

## 2015-11-07 DIAGNOSIS — N644 Mastodynia: Secondary | ICD-10-CM

## 2015-11-20 ENCOUNTER — Other Ambulatory Visit: Payer: Self-pay | Admitting: Internal Medicine

## 2015-11-20 DIAGNOSIS — R05 Cough: Secondary | ICD-10-CM

## 2015-11-20 DIAGNOSIS — R059 Cough, unspecified: Secondary | ICD-10-CM

## 2015-11-20 MED ORDER — BENZONATATE 200 MG PO CAPS: 200.0000 mg | ORAL_CAPSULE | Freq: Three times a day (TID) | ORAL | Status: DC | PRN

## 2015-12-09 ENCOUNTER — Other Ambulatory Visit: Payer: 59

## 2015-12-09 ENCOUNTER — Other Ambulatory Visit: Payer: Self-pay | Admitting: Internal Medicine

## 2015-12-09 DIAGNOSIS — R7309 Other abnormal glucose: Secondary | ICD-10-CM

## 2015-12-09 DIAGNOSIS — N390 Urinary tract infection, site not specified: Secondary | ICD-10-CM

## 2015-12-09 DIAGNOSIS — R5383 Other fatigue: Secondary | ICD-10-CM

## 2015-12-09 LAB — CBC WITH DIFFERENTIAL/PLATELET
BASOS ABS: 0 10*3/uL (ref 0.0–0.1)
Basophils Relative: 0 % (ref 0–1)
EOS ABS: 0.1 10*3/uL (ref 0.0–0.7)
EOS PCT: 2 % (ref 0–5)
HCT: 44 % (ref 36.0–46.0)
Hemoglobin: 14.7 g/dL (ref 12.0–15.0)
LYMPHS ABS: 2.3 10*3/uL (ref 0.7–4.0)
Lymphocytes Relative: 34 % (ref 12–46)
MCH: 30.2 pg (ref 26.0–34.0)
MCHC: 33.4 g/dL (ref 30.0–36.0)
MCV: 90.5 fL (ref 78.0–100.0)
MPV: 9.3 fL (ref 8.6–12.4)
Monocytes Absolute: 0.8 10*3/uL (ref 0.1–1.0)
Monocytes Relative: 12 % (ref 3–12)
Neutro Abs: 3.5 10*3/uL (ref 1.7–7.7)
Neutrophils Relative %: 52 % (ref 43–77)
PLATELETS: 220 10*3/uL (ref 150–400)
RBC: 4.86 MIL/uL (ref 3.87–5.11)
RDW: 14.6 % (ref 11.5–15.5)
WBC: 6.8 10*3/uL (ref 4.0–10.5)

## 2015-12-09 LAB — HEMOGLOBIN A1C
Hgb A1c MFr Bld: 5.7 % — ABNORMAL HIGH (ref <5.7)
MEAN PLASMA GLUCOSE: 117 mg/dL — AB (ref <117)

## 2015-12-10 LAB — URINALYSIS, ROUTINE W REFLEX MICROSCOPIC
Bilirubin Urine: NEGATIVE
Glucose, UA: NEGATIVE
HGB URINE DIPSTICK: NEGATIVE
KETONES UR: NEGATIVE
NITRITE: NEGATIVE
Protein, ur: NEGATIVE
Specific Gravity, Urine: 1.021 (ref 1.001–1.035)
pH: 6 (ref 5.0–8.0)

## 2015-12-10 LAB — URINALYSIS, MICROSCOPIC ONLY
Bacteria, UA: NONE SEEN [HPF]
CASTS: NONE SEEN [LPF]
Crystals: NONE SEEN [HPF]
SQUAMOUS EPITHELIAL / LPF: NONE SEEN [HPF] (ref <=5)
Yeast: NONE SEEN [HPF]

## 2015-12-10 LAB — URINE CULTURE
COLONY COUNT: NO GROWTH
Organism ID, Bacteria: NO GROWTH

## 2015-12-10 LAB — TSH: TSH: 1.84 mIU/L

## 2015-12-10 LAB — INSULIN, RANDOM: Insulin: 7.6 u[IU]/mL (ref 2.0–19.6)

## 2016-02-12 ENCOUNTER — Other Ambulatory Visit: Payer: Self-pay | Admitting: Physician Assistant

## 2016-02-12 ENCOUNTER — Other Ambulatory Visit: Payer: 59

## 2016-02-12 DIAGNOSIS — Z79899 Other long term (current) drug therapy: Secondary | ICD-10-CM

## 2016-02-12 LAB — BASIC METABOLIC PANEL WITH GFR
BUN: 13 mg/dL (ref 7–25)
CALCIUM: 8.9 mg/dL (ref 8.6–10.4)
CO2: 27 mmol/L (ref 20–31)
Chloride: 104 mmol/L (ref 98–110)
Creat: 0.57 mg/dL (ref 0.50–0.99)
Glucose, Bld: 108 mg/dL — ABNORMAL HIGH (ref 65–99)
POTASSIUM: 4 mmol/L (ref 3.5–5.3)
SODIUM: 140 mmol/L (ref 135–146)

## 2016-02-12 LAB — CBC WITH DIFFERENTIAL/PLATELET
BASOS ABS: 58 {cells}/uL (ref 0–200)
Basophils Relative: 1 %
EOS PCT: 3 %
Eosinophils Absolute: 174 cells/uL (ref 15–500)
HCT: 43.5 % (ref 35.0–45.0)
HEMOGLOBIN: 14.8 g/dL (ref 11.7–15.5)
LYMPHS ABS: 1914 {cells}/uL (ref 850–3900)
Lymphocytes Relative: 33 %
MCH: 30.6 pg (ref 27.0–33.0)
MCHC: 34 g/dL (ref 32.0–36.0)
MCV: 89.9 fL (ref 80.0–100.0)
MONOS PCT: 9 %
MPV: 9.7 fL (ref 7.5–12.5)
Monocytes Absolute: 522 cells/uL (ref 200–950)
NEUTROS ABS: 3132 {cells}/uL (ref 1500–7800)
Neutrophils Relative %: 54 %
PLATELETS: 206 10*3/uL (ref 140–400)
RBC: 4.84 MIL/uL (ref 3.80–5.10)
RDW: 14.8 % (ref 11.0–15.0)
WBC: 5.8 10*3/uL (ref 3.8–10.8)

## 2016-02-12 LAB — HEPATIC FUNCTION PANEL
ALBUMIN: 3.8 g/dL (ref 3.6–5.1)
ALK PHOS: 65 U/L (ref 33–130)
ALT: 15 U/L (ref 6–29)
AST: 18 U/L (ref 10–35)
BILIRUBIN DIRECT: 0.1 mg/dL (ref <=0.2)
BILIRUBIN TOTAL: 0.5 mg/dL (ref 0.2–1.2)
Indirect Bilirubin: 0.4 mg/dL (ref 0.2–1.2)
Total Protein: 7 g/dL (ref 6.1–8.1)

## 2016-02-23 ENCOUNTER — Other Ambulatory Visit: Payer: 59

## 2016-02-23 ENCOUNTER — Other Ambulatory Visit: Payer: Self-pay | Admitting: Internal Medicine

## 2016-02-23 DIAGNOSIS — R7989 Other specified abnormal findings of blood chemistry: Secondary | ICD-10-CM

## 2016-02-23 DIAGNOSIS — Z79899 Other long term (current) drug therapy: Secondary | ICD-10-CM

## 2016-02-23 DIAGNOSIS — R945 Abnormal results of liver function studies: Secondary | ICD-10-CM

## 2016-02-23 LAB — GAMMA GT: GGT: 15 U/L (ref 7–51)

## 2016-03-01 ENCOUNTER — Other Ambulatory Visit: Payer: Self-pay | Admitting: Internal Medicine

## 2016-03-01 ENCOUNTER — Other Ambulatory Visit: Payer: 59

## 2016-03-01 ENCOUNTER — Ambulatory Visit
Admission: RE | Admit: 2016-03-01 | Discharge: 2016-03-01 | Disposition: A | Discharge status: Home / Self Care | Payer: 59 | Source: Ambulatory Visit | Attending: Internal Medicine | Admitting: Internal Medicine

## 2016-03-01 DIAGNOSIS — R7989 Other specified abnormal findings of blood chemistry: Secondary | ICD-10-CM

## 2016-03-01 DIAGNOSIS — R945 Abnormal results of liver function studies: Secondary | ICD-10-CM

## 2016-03-12 ENCOUNTER — Other Ambulatory Visit: Payer: Self-pay | Admitting: Internal Medicine

## 2016-03-26 ENCOUNTER — Other Ambulatory Visit (INDEPENDENT_AMBULATORY_CARE_PROVIDER_SITE_OTHER): Payer: 59

## 2016-03-26 DIAGNOSIS — M25512 Pain in left shoulder: Secondary | ICD-10-CM

## 2016-03-26 MED ORDER — DEXAMETHASONE SODIUM PHOSPHATE 100 MG/10ML IJ SOLN
10.0000 mg | Freq: Once | INTRAMUSCULAR | Status: AC
  Administered 2016-03-26: 10 mg via INTRAMUSCULAR

## 2016-05-10 ENCOUNTER — Other Ambulatory Visit: Payer: Self-pay | Admitting: Physician Assistant

## 2016-05-10 DIAGNOSIS — E2839 Other primary ovarian failure: Secondary | ICD-10-CM

## 2016-05-10 DIAGNOSIS — Z1231 Encounter for screening mammogram for malignant neoplasm of breast: Secondary | ICD-10-CM

## 2016-05-14 ENCOUNTER — Other Ambulatory Visit: Payer: Self-pay | Admitting: Physician Assistant

## 2016-05-14 DIAGNOSIS — Z79899 Other long term (current) drug therapy: Secondary | ICD-10-CM

## 2016-05-17 ENCOUNTER — Other Ambulatory Visit: Payer: 59

## 2016-05-17 DIAGNOSIS — Z79899 Other long term (current) drug therapy: Secondary | ICD-10-CM

## 2016-05-17 LAB — CBC WITH DIFFERENTIAL/PLATELET
BASOS ABS: 0 {cells}/uL (ref 0–200)
Basophils Relative: 0 %
EOS ABS: 130 {cells}/uL (ref 15–500)
Eosinophils Relative: 2 %
HEMATOCRIT: 43.7 % (ref 35.0–45.0)
Hemoglobin: 14.8 g/dL (ref 11.7–15.5)
LYMPHS PCT: 33 %
Lymphs Abs: 2145 cells/uL (ref 850–3900)
MCH: 30.8 pg (ref 27.0–33.0)
MCHC: 33.9 g/dL (ref 32.0–36.0)
MCV: 91 fL (ref 80.0–100.0)
MONO ABS: 585 {cells}/uL (ref 200–950)
MPV: 9.3 fL (ref 7.5–12.5)
Monocytes Relative: 9 %
NEUTROS PCT: 56 %
Neutro Abs: 3640 cells/uL (ref 1500–7800)
Platelets: 215 10*3/uL (ref 140–400)
RBC: 4.8 MIL/uL (ref 3.80–5.10)
RDW: 14.6 % (ref 11.0–15.0)
WBC: 6.5 10*3/uL (ref 3.8–10.8)

## 2016-05-17 LAB — GAMMA GT: GGT: 14 U/L (ref 7–51)

## 2016-05-17 LAB — HEPATIC FUNCTION PANEL
ALT: 15 U/L (ref 6–29)
AST: 17 U/L (ref 10–35)
Albumin: 3.9 g/dL (ref 3.6–5.1)
Alkaline Phosphatase: 63 U/L (ref 33–130)
Bilirubin, Direct: 0.1 mg/dL (ref <=0.2)
Indirect Bilirubin: 0.5 mg/dL (ref 0.2–1.2)
Total Bilirubin: 0.6 mg/dL (ref 0.2–1.2)
Total Protein: 6.7 g/dL (ref 6.1–8.1)

## 2016-05-26 ENCOUNTER — Other Ambulatory Visit: Payer: Self-pay | Admitting: Internal Medicine

## 2016-05-28 ENCOUNTER — Ambulatory Visit
Admission: RE | Admit: 2016-05-28 | Discharge: 2016-05-28 | Disposition: A | Discharge status: Home / Self Care | Payer: 59 | Source: Ambulatory Visit | Attending: Physician Assistant | Admitting: Physician Assistant

## 2016-05-28 DIAGNOSIS — Z1231 Encounter for screening mammogram for malignant neoplasm of breast: Secondary | ICD-10-CM

## 2016-05-28 DIAGNOSIS — E2839 Other primary ovarian failure: Secondary | ICD-10-CM

## 2016-05-31 ENCOUNTER — Other Ambulatory Visit: Payer: Self-pay | Admitting: Physician Assistant

## 2016-05-31 DIAGNOSIS — R928 Other abnormal and inconclusive findings on diagnostic imaging of breast: Secondary | ICD-10-CM

## 2016-06-04 ENCOUNTER — Other Ambulatory Visit: Payer: 59

## 2016-06-04 ENCOUNTER — Ambulatory Visit
Admission: RE | Admit: 2016-06-04 | Discharge: 2016-06-04 | Disposition: A | Discharge status: Home / Self Care | Payer: 59 | Source: Ambulatory Visit | Attending: Physician Assistant | Admitting: Physician Assistant

## 2016-06-04 DIAGNOSIS — R928 Other abnormal and inconclusive findings on diagnostic imaging of breast: Secondary | ICD-10-CM

## 2016-06-15 ENCOUNTER — Other Ambulatory Visit: Payer: Self-pay | Admitting: Internal Medicine

## 2016-06-16 ENCOUNTER — Other Ambulatory Visit: Payer: Self-pay | Admitting: Internal Medicine

## 2016-06-16 MED ORDER — TRAMADOL HCL 50 MG PO TABS: 50.0000 mg | ORAL_TABLET | Freq: Four times a day (QID) | ORAL | 0 refills | Status: DC | PRN

## 2016-06-18 ENCOUNTER — Encounter (INDEPENDENT_AMBULATORY_CARE_PROVIDER_SITE_OTHER): Payer: Self-pay

## 2016-06-18 ENCOUNTER — Other Ambulatory Visit: Payer: Self-pay | Admitting: Internal Medicine

## 2016-06-18 ENCOUNTER — Ambulatory Visit (HOSPITAL_COMMUNITY)
Admission: RE | Admit: 2016-06-18 | Discharge: 2016-06-18 | Disposition: A | Discharge status: Home / Self Care | Payer: 59 | Source: Ambulatory Visit | Attending: Internal Medicine | Admitting: Internal Medicine

## 2016-06-18 DIAGNOSIS — M5134 Other intervertebral disc degeneration, thoracic region: Secondary | ICD-10-CM | POA: Diagnosis not present

## 2016-06-18 DIAGNOSIS — M479 Spondylosis, unspecified: Secondary | ICD-10-CM | POA: Insufficient documentation

## 2016-06-18 DIAGNOSIS — S20211A Contusion of right front wall of thorax, initial encounter: Secondary | ICD-10-CM

## 2016-06-18 DIAGNOSIS — M546 Pain in thoracic spine: Secondary | ICD-10-CM | POA: Diagnosis not present

## 2016-08-13 ENCOUNTER — Ambulatory Visit (INDEPENDENT_AMBULATORY_CARE_PROVIDER_SITE_OTHER): Payer: 59 | Admitting: *Deleted

## 2016-08-13 DIAGNOSIS — Z23 Encounter for immunization: Secondary | ICD-10-CM | POA: Diagnosis not present

## 2016-08-13 DIAGNOSIS — R300 Dysuria: Secondary | ICD-10-CM

## 2016-08-13 DIAGNOSIS — Z111 Encounter for screening for respiratory tuberculosis: Secondary | ICD-10-CM | POA: Diagnosis not present

## 2016-08-13 DIAGNOSIS — Z79899 Other long term (current) drug therapy: Secondary | ICD-10-CM

## 2016-08-13 LAB — CBC WITH DIFFERENTIAL/PLATELET
BASOS ABS: 0 {cells}/uL (ref 0–200)
Basophils Relative: 0 %
EOS ABS: 116 {cells}/uL (ref 15–500)
Eosinophils Relative: 2 %
HCT: 45.3 % — ABNORMAL HIGH (ref 35.0–45.0)
Hemoglobin: 15.2 g/dL (ref 11.7–15.5)
LYMPHS PCT: 28 %
Lymphs Abs: 1624 cells/uL (ref 850–3900)
MCH: 30.6 pg (ref 27.0–33.0)
MCHC: 33.6 g/dL (ref 32.0–36.0)
MCV: 91.1 fL (ref 80.0–100.0)
MONOS PCT: 10 %
MPV: 9.7 fL (ref 7.5–12.5)
Monocytes Absolute: 580 cells/uL (ref 200–950)
NEUTROS PCT: 60 %
Neutro Abs: 3480 cells/uL (ref 1500–7800)
PLATELETS: 237 10*3/uL (ref 140–400)
RBC: 4.97 MIL/uL (ref 3.80–5.10)
RDW: 14.5 % (ref 11.0–15.0)
WBC: 5.8 10*3/uL (ref 3.8–10.8)

## 2016-08-13 LAB — HEPATIC FUNCTION PANEL
ALBUMIN: 4 g/dL (ref 3.6–5.1)
ALT: 13 U/L (ref 6–29)
AST: 16 U/L (ref 10–35)
Alkaline Phosphatase: 79 U/L (ref 33–130)
Bilirubin, Direct: 0.1 mg/dL (ref <=0.2)
Indirect Bilirubin: 0.4 mg/dL (ref 0.2–1.2)
Total Bilirubin: 0.5 mg/dL (ref 0.2–1.2)
Total Protein: 7.1 g/dL (ref 6.1–8.1)

## 2016-08-13 LAB — GAMMA GT: GGT: 16 U/L (ref 7–51)

## 2016-08-14 LAB — URINALYSIS, ROUTINE W REFLEX MICROSCOPIC
BILIRUBIN URINE: NEGATIVE
Glucose, UA: NEGATIVE
HGB URINE DIPSTICK: NEGATIVE
KETONES UR: NEGATIVE
NITRITE: NEGATIVE
Protein, ur: NEGATIVE
Specific Gravity, Urine: 1.017 (ref 1.001–1.035)
pH: 6.5 (ref 5.0–8.0)

## 2016-08-14 LAB — URINALYSIS, MICROSCOPIC ONLY
Bacteria, UA: NONE SEEN [HPF]
CASTS: NONE SEEN [LPF]
Crystals: NONE SEEN [HPF]
Yeast: NONE SEEN [HPF]

## 2016-08-16 ENCOUNTER — Encounter: Payer: Self-pay | Admitting: Internal Medicine

## 2016-08-16 LAB — TB SKIN TEST
INDURATION: 0 mm
TB Skin Test: NEGATIVE

## 2016-08-16 LAB — URINE CULTURE

## 2016-08-25 ENCOUNTER — Other Ambulatory Visit: Payer: Self-pay | Admitting: Internal Medicine

## 2016-08-25 MED ORDER — MUPIROCIN 2 % EX OINT: 1.0000 "application " | TOPICAL_OINTMENT | Freq: Two times a day (BID) | CUTANEOUS | 0 refills | Status: DC

## 2016-08-25 MED ORDER — CEPHALEXIN 500 MG PO CAPS: 500.0000 mg | ORAL_CAPSULE | Freq: Four times a day (QID) | ORAL | 0 refills | Status: AC

## 2016-08-25 NOTE — Progress Notes (Signed)
Patient reports that she did have a spot taken off her breast last week and since that time it has been more painful and also has been draining a little bit.  She notes some mild spreading redness.  She was briefly examined and did have some mild surrounding redness.  Will send in keflex QID and use bactroban twice daily.

## 2016-09-09 ENCOUNTER — Other Ambulatory Visit: Payer: Self-pay | Admitting: Internal Medicine

## 2016-09-09 DIAGNOSIS — R609 Edema, unspecified: Secondary | ICD-10-CM

## 2016-09-09 MED ORDER — HYDROCHLOROTHIAZIDE 25 MG PO TABS: 25.0000 mg | ORAL_TABLET | Freq: Every day | ORAL | 1 refills | Status: DC

## 2016-10-06 ENCOUNTER — Other Ambulatory Visit: Payer: 59

## 2016-10-06 ENCOUNTER — Other Ambulatory Visit: Payer: Self-pay | Admitting: Internal Medicine

## 2016-10-06 DIAGNOSIS — N3000 Acute cystitis without hematuria: Secondary | ICD-10-CM

## 2016-10-06 MED ORDER — CEPHALEXIN 500 MG PO CAPS
ORAL_CAPSULE | ORAL | 1 refills | Status: DC
Start: 2016-10-06 — End: 2016-11-30

## 2016-10-06 MED ORDER — CIPROFLOXACIN HCL 500 MG PO TABS: ORAL_TABLET | ORAL | 1 refills | Status: DC

## 2016-10-07 LAB — URINALYSIS, MICROSCOPIC ONLY
CASTS: NONE SEEN [LPF]
CRYSTALS: NONE SEEN [HPF]
Squamous Epithelial / LPF: NONE SEEN [HPF] (ref <=5)
WBC, UA: >60 WBC/HPF — AB (ref <=5)
YEAST: NONE SEEN [HPF]

## 2016-10-07 LAB — URINALYSIS, ROUTINE W REFLEX MICROSCOPIC
BILIRUBIN URINE: NEGATIVE
GLUCOSE, UA: NEGATIVE
KETONES UR: NEGATIVE
Nitrite: POSITIVE — AB
PROTEIN: NEGATIVE
Specific Gravity, Urine: 1.017 (ref 1.001–1.035)
pH: 6.5 (ref 5.0–8.0)

## 2016-10-08 LAB — URINE CULTURE: Colony Count: >=100000

## 2016-10-19 ENCOUNTER — Other Ambulatory Visit: Payer: Self-pay | Admitting: Internal Medicine

## 2016-11-09 ENCOUNTER — Ambulatory Visit (INDEPENDENT_AMBULATORY_CARE_PROVIDER_SITE_OTHER): Payer: Medicare HMO | Admitting: *Deleted

## 2016-11-09 ENCOUNTER — Other Ambulatory Visit: Payer: Self-pay | Admitting: Internal Medicine

## 2016-11-09 DIAGNOSIS — N3000 Acute cystitis without hematuria: Secondary | ICD-10-CM

## 2016-11-09 DIAGNOSIS — R300 Dysuria: Secondary | ICD-10-CM | POA: Diagnosis not present

## 2016-11-09 NOTE — Addendum Note (Signed)
Addended by: Jacquel Mccamish A on: 11/09/2016 04:33 PM   Modules accepted: Orders

## 2016-11-09 NOTE — Progress Notes (Signed)
Patient presents for recheck UA, C&S.  Patient c/o increasing dysuria, burning and lower abd pain  times 1 day.

## 2016-11-10 LAB — URINALYSIS, ROUTINE W REFLEX MICROSCOPIC
BILIRUBIN URINE: NEGATIVE
GLUCOSE, UA: NEGATIVE
Ketones, ur: NEGATIVE
Nitrite: POSITIVE — AB
Specific Gravity, Urine: 1.02 (ref 1.001–1.035)
pH: 7 (ref 5.0–8.0)

## 2016-11-10 LAB — URINALYSIS, MICROSCOPIC ONLY
CASTS: NONE SEEN [LPF]
CRYSTALS: NONE SEEN [HPF]
Squamous Epithelial / LPF: NONE SEEN [HPF] (ref <=5)
Yeast: NONE SEEN [HPF]

## 2016-11-11 LAB — URINE CULTURE

## 2016-11-17 ENCOUNTER — Other Ambulatory Visit: Payer: Self-pay | Admitting: Internal Medicine

## 2016-11-24 ENCOUNTER — Ambulatory Visit: Payer: Self-pay | Admitting: Internal Medicine

## 2016-11-30 ENCOUNTER — Ambulatory Visit (INDEPENDENT_AMBULATORY_CARE_PROVIDER_SITE_OTHER): Payer: Medicare HMO | Admitting: Internal Medicine

## 2016-11-30 ENCOUNTER — Encounter: Payer: Self-pay | Admitting: Internal Medicine

## 2016-11-30 VITALS — BP 116/62 | HR 88 | Temp 98.2°F | Resp 16 | Ht 64.75 in | Wt 214.0 lb

## 2016-11-30 DIAGNOSIS — F419 Anxiety disorder, unspecified: Secondary | ICD-10-CM

## 2016-11-30 DIAGNOSIS — Z79899 Other long term (current) drug therapy: Secondary | ICD-10-CM

## 2016-11-30 DIAGNOSIS — Z0001 Encounter for general adult medical examination with abnormal findings: Secondary | ICD-10-CM | POA: Diagnosis not present

## 2016-11-30 DIAGNOSIS — R7303 Prediabetes: Secondary | ICD-10-CM | POA: Diagnosis not present

## 2016-11-30 DIAGNOSIS — R6889 Other general symptoms and signs: Secondary | ICD-10-CM

## 2016-11-30 DIAGNOSIS — N3281 Overactive bladder: Secondary | ICD-10-CM | POA: Diagnosis not present

## 2016-11-30 DIAGNOSIS — Z0000 Encounter for general adult medical examination without abnormal findings: Secondary | ICD-10-CM

## 2016-11-30 DIAGNOSIS — N8110 Cystocele, unspecified: Secondary | ICD-10-CM

## 2016-11-30 DIAGNOSIS — N390 Urinary tract infection, site not specified: Secondary | ICD-10-CM | POA: Diagnosis not present

## 2016-11-30 DIAGNOSIS — Z136 Encounter for screening for cardiovascular disorders: Secondary | ICD-10-CM

## 2016-11-30 DIAGNOSIS — Z87898 Personal history of other specified conditions: Secondary | ICD-10-CM | POA: Insufficient documentation

## 2016-11-30 DIAGNOSIS — Z86711 Personal history of pulmonary embolism: Secondary | ICD-10-CM

## 2016-11-30 DIAGNOSIS — R69 Illness, unspecified: Secondary | ICD-10-CM | POA: Diagnosis not present

## 2016-11-30 DIAGNOSIS — I10 Essential (primary) hypertension: Secondary | ICD-10-CM | POA: Diagnosis not present

## 2016-11-30 LAB — HEPATIC FUNCTION PANEL
ALBUMIN: 4.1 g/dL (ref 3.6–5.1)
ALT: 13 U/L (ref 6–29)
AST: 17 U/L (ref 10–35)
Alkaline Phosphatase: 58 U/L (ref 33–130)
BILIRUBIN TOTAL: 0.6 mg/dL (ref 0.2–1.2)
Bilirubin, Direct: 0.1 mg/dL (ref <=0.2)
Indirect Bilirubin: 0.5 mg/dL (ref 0.2–1.2)
Total Protein: 7.4 g/dL (ref 6.1–8.1)

## 2016-11-30 LAB — CBC WITH DIFFERENTIAL/PLATELET
BASOS ABS: 73 {cells}/uL (ref 0–200)
Basophils Relative: 1 %
EOS ABS: 292 {cells}/uL (ref 15–500)
Eosinophils Relative: 4 %
HCT: 46.1 % — ABNORMAL HIGH (ref 35.0–45.0)
Hemoglobin: 15.3 g/dL (ref 11.7–15.5)
LYMPHS PCT: 31 %
Lymphs Abs: 2263 cells/uL (ref 850–3900)
MCH: 29.7 pg (ref 27.0–33.0)
MCHC: 33.2 g/dL (ref 32.0–36.0)
MCV: 89.3 fL (ref 80.0–100.0)
MONOS PCT: 10 %
MPV: 9.7 fL (ref 7.5–12.5)
Monocytes Absolute: 730 cells/uL (ref 200–950)
Neutro Abs: 3942 cells/uL (ref 1500–7800)
Neutrophils Relative %: 54 %
PLATELETS: 240 10*3/uL (ref 140–400)
RBC: 5.16 MIL/uL — ABNORMAL HIGH (ref 3.80–5.10)
RDW: 14.8 % (ref 11.0–15.0)
WBC: 7.3 10*3/uL (ref 3.8–10.8)

## 2016-11-30 LAB — BASIC METABOLIC PANEL WITH GFR
BUN: 11 mg/dL (ref 7–25)
CHLORIDE: 100 mmol/L (ref 98–110)
CO2: 28 mmol/L (ref 20–31)
CREATININE: 0.62 mg/dL (ref 0.50–0.99)
Calcium: 9.1 mg/dL (ref 8.6–10.4)
GFR, Est African American: >89 mL/min (ref >=60)
GFR, Est Non African American: >89 mL/min (ref >=60)
Glucose, Bld: 82 mg/dL (ref 65–99)
Potassium: 3.8 mmol/L (ref 3.5–5.3)
Sodium: 139 mmol/L (ref 135–146)

## 2016-11-30 MED ORDER — OMEPRAZOLE 40 MG PO CPDR: 40.0000 mg | DELAYED_RELEASE_CAPSULE | Freq: Every day | ORAL | 1 refills | Status: DC

## 2016-11-30 NOTE — Progress Notes (Signed)
WELCOME TO MEDICARE Assessment:    1. Frequent UTI -UA -Urine Culture -consider possibly doing trimethoprim x 3 months for prophylaxis - Ambulatory referral to Urogynecology  2. Female cystocele -may need revision of surgery - Ambulatory referral to Urogynecology  3. OAB (overactive bladder) -Cont Vesicare  4. Prediabetes -cont diet and exercise -needs A1c level to be checked at physical  5. History of pulmonary embolus (PE) -provoked PE secondary to humeral fracture and surgery  6. Anxiety -is well controlled on sertraline -would like to continue on it  7. Medication management - CBC with Differential/Platelet - BASIC METABOLIC PANEL WITH GFR - Hepatic function panel - Urinalysis, Routine w reflex microscopic - Culture, Urine  8. Welcome to Medicare preventive visit -will need a Medicare follow-up visit.      Over 30 minutes of exam, counseling, chart review, and critical decision making was performed  No future appointments.   Plan:   During the course of the visit the patient was educated and counseled about appropriate screening and preventive services including:    Pneumococcal vaccine   Influenza vaccine  Prevnar 13  Td vaccine  Screening electrocardiogram  Colorectal cancer screening  Diabetes screening  Glaucoma screening  Nutrition counseling    Subjective:  Vanessa French is a 66 y.o. female who presents for Medicare Annual Wellness Visit.  Her blood pressure has been controlled at home, today their BP is BP: 116/62 She does not workout. She denies chest pain, shortness of breath, dizziness.  She is on cholesterol medication and denies myalgias. Her cholesterol is at goal. The cholesterol last visit was:   Lab Results  Component Value Date   CHOL 184 07/31/2014   HDL 60 07/31/2014   LDLCALC 107 (H) 07/31/2014   TRIG 84 07/31/2014   CHOLHDL 3.1 07/31/2014   She has been working on diet and exercise for prediabetes, and denies  foot ulcerations, hyperglycemia, hypoglycemia , increased appetite, nausea, paresthesia of the feet, polydipsia, polyuria, visual disturbances, vomiting and weight loss. Last A1C in the office was:  Lab Results  Component Value Date   HGBA1C 5.7 (H) 12/09/2015   Last GFR Lab Results  Component Value Date   [place] >89 02/12/2016     Lab Results  Component Value Date   GFRAA >89 02/12/2016   Patient is on Vitamin D supplement.   Lab Results  Component Value Date   VD25OH 114 (H) 07/31/2014     She is occasionally getting chest pressure that is accompanied by nausea.  She reports that this happens intermittently.  She has it both at rest and during activity.  She reports that it lasted approximately 20 minutes.  She had cereal and milk prior to going to church.  She had not had any coffee.  She normally does drink coffee.  She has been take omeprazole regularly.    She has recently had a UTI and she was taking antibiotics for it.  She had a bladder sling in 2000 and has a history of vaginal hysterectomy.     Medication Review: Current Outpatient Prescriptions on File Prior to Visit  Medication Sig Dispense Refill  . alendronate (FOSAMAX) 70 MG tablet Take 1 tablet (70 mg total) by mouth every 7 (seven) days. Take with a full glass of water on an empty stomach. 12 tablet 1  . calcipotriene (DOVONOX) 0.005 % cream Apply 1 application topically at bedtime.    . calcium carbonate (TUMS EX) 750 MG chewable tablet Chew 1 tablet by  mouth daily.    . Cholecalciferol (VITAMIN D-3) 5000 UNITS TABS Take 5,000 Units by mouth 2 (two) times daily.    Marland Kitchen CINNAMON PO Take 1,000 mg by mouth 2 (two) times daily.    Marland Kitchen FOLIC ACID PO Take 1 tablet by mouth daily.    . hydrochlorothiazide (HYDRODIURIL) 25 MG tablet Take 1 tablet (25 mg total) by mouth daily. 90 tablet 1  . methotrexate (RHEUMATREX) 2.5 MG tablet TAKE 3 TABLET BY MOUTH ONCE A WEEK FOR 4 WEEKS  2  . Multiple Vitamin (MULTI-VITAMIN DAILY  PO) Take 1 tablet by mouth daily.    Marland Kitchen omeprazole (PRILOSEC) 20 MG capsule Take 1 capsule (20 mg total) by mouth daily. 90 capsule 1  . sertraline (ZOLOFT) 25 MG tablet TAKE ONE TABLET BY MOUTH DAILY 31 tablet 0  . solifenacin (VESICARE) 5 MG tablet Take 5 mg by mouth daily.     . vitamin C (ASCORBIC ACID) 500 MG tablet Take 500 mg by mouth 2 (two) times daily.     No current facility-administered medications on file prior to visit.     Allergies: Allergies  Allergen Reactions  . Penicillins Other (See Comments)    Yeast infection  . Latex Rash    Current Problems (verified) has Closed fracture of right proximal humerus; Acute pulmonary embolism ([place]); Anxiety; Hypotension; Tachycardia; and Pulmonary embolus ([place]) on her problem list.  Screening Tests Immunization History  Administered Date(s) Administered  . DTaP 08/04/2013  . Hepatitis B 01/11/2013  . Influenza Split 07/25/2012, 08/12/2014, 08/21/2015  . Influenza,inj,quad, With Preservative 08/13/2016  . PPD Test 07/31/2014, 07/28/2015, 08/13/2016  . Pneumococcal Conjugate-13 10/25/2004  . Zoster 10/26/2003    Preventative care: Last colonoscopy: 2014, repeat 5 years Mammogram: 2017 Dexa: 2017    Names of Other Physician/Practitioners you currently use: 1. [facility] here for primary care 2. Dr. Prudencio Burly, eye doctor, last visit 2017 3. Dr. Colin Benton , dentist, last visit 2017  Patient Care Team: Unk Pinto, MD as PCP - General (Internal Medicine) Jari Pigg, MD as Consulting Physician (Dermatology) Marybelle Killings, MD as Consulting Physician (Orthopedic Surgery)  Surgical: She  has a past surgical history that includes Tonsillectomy; Appendectomy; Abdominal hysterectomy; Bladder suspension; Ureter revision; Extensor tendon of forearm / wrist repair; ORIF humerus fracture (04/07/2012); ORIF humerus fracture (04/07/2012); and Steriod injection (04/07/2012). Family Her family  history includes Colon cancer in her father; Heart disease in her maternal grandfather and mother. Social history  She reports that she has never smoked. She has never used smokeless tobacco. She reports that she does not drink alcohol or use drugs.  MEDICARE WELLNESS OBJECTIVES: Physical activity: Current Exercise Habits: The patient does not participate in regular exercise at present Cardiac risk factors: Cardiac Risk Factors include: obesity (BMI >30kg/m2) Depression/mood screen:   Depression screen [facility] 2/9 11/30/2016  Decreased Interest 0  Down, Depressed, Hopeless 0  PHQ - 2 Score 0    ADLs:  In your present state of health, do you have any difficulty performing the following activities: 11/30/2016  Hearing? N  Vision? N  Difficulty concentrating or making decisions? N  Walking or climbing stairs? N  Dressing or bathing? N  Doing errands, shopping? N  Preparing Food and eating ? N  Using the Toilet? N  In the past six months, have you accidently leaked urine? Y  Do you have problems with loss of bowel control? N  Managing your Medications? N  Managing your Finances? N  Housekeeping or managing your Housekeeping? N  Some recent data might be hidden     Cognitive Testing  Alert? Yes  Normal Appearance?Yes  Oriented to person? Yes  Place? Yes   Time? Yes  Recall of three objects?  Yes  Can perform simple calculations? Yes  Displays appropriate judgment?Yes  Can read the correct time from a watch face?Yes  EOL planning: Does Patient Have a Medical Advance Directive?: Yes Type of Advance Directive: Healthcare Power of Attorney, Living will Does patient want to make changes to medical advance directive?: No - Patient declined Copy of [place] in Chart?: No - copy requested   Objective:   Today's Vitals   11/30/16 1137  BP: 116/62  Pulse: 88  Resp: 16  Temp: 98.2 F (36.8 C)  TempSrc: Temporal  Weight: 214 lb (97.1 kg)  Height: 5' 4.75" (1.645 m)    Body mass index is 35.89 kg/m.  General appearance: alert, no distress, WD/WN, female HEENT: normocephalic, sclerae anicteric, TMs pearly, nares patent, no discharge or erythema, pharynx normal Oral cavity: MMM, no lesions Neck: supple, no lymphadenopathy, no thyromegaly, no masses Heart: RRR, normal S1, S2, no murmurs Lungs: CTA bilaterally, no wheezes, rhonchi, or rales Abdomen: +bs, soft, non tender, non distended, no masses, no hepatomegaly, no splenomegaly Musculoskeletal: nontender, no swelling, no obvious deformity Extremities: no edema, no cyanosis, no clubbing Pulses: 2+ symmetric, upper and lower extremities, normal cap refill Neurological: alert, oriented x 3, CN2-12 intact, strength normal upper extremities and lower extremities, sensation normal throughout, DTRs 2+ throughout, no cerebellar signs, gait normal Psychiatric: normal affect, behavior normal, pleasant   Medicare Attestation I have personally reviewed: The patient's medical and social history Their use of alcohol, tobacco or illicit drugs Their current medications and supplements The patient's functional ability including ADLs,fall risks, home safety risks, cognitive, and hearing and visual impairment Diet and physical activities Evidence for depression or mood disorders  The patient's weight, height, BMI, and visual acuity have been recorded in the chart.  I have made referrals, counseling, and provided education to the patient based on review of the above and I have provided the patient with a written personalized care plan for preventive services.     Starlyn Skeans, PA-C   11/30/2016

## 2016-12-01 LAB — URINALYSIS, ROUTINE W REFLEX MICROSCOPIC
Bilirubin Urine: NEGATIVE
Glucose, UA: NEGATIVE
HGB URINE DIPSTICK: NEGATIVE
Ketones, ur: NEGATIVE
NITRITE: NEGATIVE
PH: 7 (ref 5.0–8.0)
Protein, ur: NEGATIVE
SPECIFIC GRAVITY, URINE: 1.007 (ref 1.001–1.035)

## 2016-12-01 LAB — URINALYSIS, MICROSCOPIC ONLY
Bacteria, UA: NONE SEEN [HPF]
Casts: NONE SEEN [LPF]
Crystals: NONE SEEN [HPF]
RBC / HPF: NONE SEEN RBC/HPF (ref <=2)
SQUAMOUS EPITHELIAL / LPF: NONE SEEN [HPF] (ref <=5)
WBC UA: NONE SEEN WBC/HPF (ref <=5)
YEAST: NONE SEEN [HPF]

## 2016-12-01 LAB — URINE CULTURE: ORGANISM ID, BACTERIA: NO GROWTH

## 2016-12-02 ENCOUNTER — Other Ambulatory Visit: Payer: Self-pay | Admitting: *Deleted

## 2016-12-02 MED ORDER — OMEPRAZOLE 40 MG PO CPDR: 40.0000 mg | DELAYED_RELEASE_CAPSULE | Freq: Every day | ORAL | 1 refills | Status: DC

## 2016-12-03 ENCOUNTER — Other Ambulatory Visit: Payer: Self-pay | Admitting: *Deleted

## 2016-12-03 MED ORDER — OMEPRAZOLE 40 MG PO CPDR: 40.0000 mg | DELAYED_RELEASE_CAPSULE | Freq: Every day | ORAL | 1 refills | Status: DC

## 2016-12-06 NOTE — Addendum Note (Signed)
Addended by: Emilia Kayes A on: 12/06/2016 09:36 AM   Modules accepted: Orders

## 2016-12-15 ENCOUNTER — Other Ambulatory Visit: Payer: Self-pay | Admitting: Internal Medicine

## 2016-12-15 ENCOUNTER — Other Ambulatory Visit: Payer: Self-pay | Admitting: Physician Assistant

## 2016-12-15 DIAGNOSIS — R609 Edema, unspecified: Secondary | ICD-10-CM

## 2016-12-15 MED ORDER — HYDROCHLOROTHIAZIDE 25 MG PO TABS: 25.0000 mg | ORAL_TABLET | Freq: Every day | ORAL | 1 refills | Status: DC

## 2016-12-29 ENCOUNTER — Other Ambulatory Visit: Payer: Self-pay | Admitting: Internal Medicine

## 2016-12-29 MED ORDER — MECLIZINE HCL 25 MG PO TABS: 25.0000 mg | ORAL_TABLET | Freq: Three times a day (TID) | ORAL | 0 refills | Status: DC | PRN

## 2017-01-10 ENCOUNTER — Other Ambulatory Visit: Payer: Medicare HMO

## 2017-01-10 ENCOUNTER — Other Ambulatory Visit: Payer: Self-pay | Admitting: Internal Medicine

## 2017-01-10 DIAGNOSIS — R300 Dysuria: Secondary | ICD-10-CM

## 2017-01-10 LAB — URINALYSIS, ROUTINE W REFLEX MICROSCOPIC
BILIRUBIN URINE: NEGATIVE
Glucose, UA: NEGATIVE
HGB URINE DIPSTICK: NEGATIVE
KETONES UR: NEGATIVE
Nitrite: NEGATIVE
PROTEIN: NEGATIVE
Specific Gravity, Urine: 1.011 (ref 1.001–1.035)
pH: 7.5 (ref 5.0–8.0)

## 2017-01-10 LAB — URINALYSIS, MICROSCOPIC ONLY
Casts: NONE SEEN [LPF]
Crystals: NONE SEEN [HPF]
YEAST: NONE SEEN [HPF]

## 2017-01-11 LAB — URINE CULTURE

## 2017-01-14 DIAGNOSIS — H5211 Myopia, right eye: Secondary | ICD-10-CM | POA: Diagnosis not present

## 2017-01-14 DIAGNOSIS — Z961 Presence of intraocular lens: Secondary | ICD-10-CM | POA: Diagnosis not present

## 2017-01-17 DIAGNOSIS — N3020 Other chronic cystitis without hematuria: Secondary | ICD-10-CM | POA: Diagnosis not present

## 2017-01-21 ENCOUNTER — Other Ambulatory Visit: Payer: Self-pay | Admitting: Internal Medicine

## 2017-02-02 DIAGNOSIS — Z79899 Other long term (current) drug therapy: Secondary | ICD-10-CM | POA: Diagnosis not present

## 2017-02-02 DIAGNOSIS — D2271 Melanocytic nevi of right lower limb, including hip: Secondary | ICD-10-CM | POA: Diagnosis not present

## 2017-02-02 DIAGNOSIS — L409 Psoriasis, unspecified: Secondary | ICD-10-CM | POA: Diagnosis not present

## 2017-05-04 DIAGNOSIS — Z79899 Other long term (current) drug therapy: Secondary | ICD-10-CM | POA: Diagnosis not present

## 2017-05-04 DIAGNOSIS — L409 Psoriasis, unspecified: Secondary | ICD-10-CM | POA: Diagnosis not present

## 2017-06-01 ENCOUNTER — Other Ambulatory Visit: Payer: Self-pay

## 2017-06-01 MED ORDER — OMEPRAZOLE 40 MG PO CPDR: 40.0000 mg | DELAYED_RELEASE_CAPSULE | Freq: Every day | ORAL | 1 refills | Status: DC

## 2017-06-08 ENCOUNTER — Ambulatory Visit (HOSPITAL_COMMUNITY)
Admission: RE | Admit: 2017-06-08 | Discharge: 2017-06-08 | Disposition: A | Discharge status: Home / Self Care | Payer: Medicare HMO | Source: Ambulatory Visit | Attending: Internal Medicine | Admitting: Internal Medicine

## 2017-06-08 ENCOUNTER — Other Ambulatory Visit: Payer: Self-pay | Admitting: Internal Medicine

## 2017-06-08 DIAGNOSIS — M79671 Pain in right foot: Secondary | ICD-10-CM | POA: Diagnosis not present

## 2017-06-08 DIAGNOSIS — Z79899 Other long term (current) drug therapy: Secondary | ICD-10-CM

## 2017-06-08 DIAGNOSIS — M7731 Calcaneal spur, right foot: Secondary | ICD-10-CM | POA: Insufficient documentation

## 2017-06-09 NOTE — Progress Notes (Signed)
Pt aware of lab results & voiced understanding of those results.

## 2017-06-22 ENCOUNTER — Other Ambulatory Visit: Payer: Self-pay | Admitting: Physician Assistant

## 2017-06-22 DIAGNOSIS — Z1231 Encounter for screening mammogram for malignant neoplasm of breast: Secondary | ICD-10-CM

## 2017-06-24 ENCOUNTER — Other Ambulatory Visit: Payer: Self-pay | Admitting: Internal Medicine

## 2017-06-24 MED ORDER — PREDNISONE 20 MG PO TABS: ORAL_TABLET | ORAL | 1 refills | Status: DC

## 2017-06-30 ENCOUNTER — Encounter (INDEPENDENT_AMBULATORY_CARE_PROVIDER_SITE_OTHER): Payer: Self-pay | Admitting: Surgery

## 2017-06-30 ENCOUNTER — Ambulatory Visit (INDEPENDENT_AMBULATORY_CARE_PROVIDER_SITE_OTHER): Payer: Medicare HMO | Admitting: Surgery

## 2017-06-30 ENCOUNTER — Other Ambulatory Visit: Payer: Medicare HMO

## 2017-06-30 ENCOUNTER — Other Ambulatory Visit: Payer: Self-pay | Admitting: Internal Medicine

## 2017-06-30 ENCOUNTER — Ambulatory Visit (INDEPENDENT_AMBULATORY_CARE_PROVIDER_SITE_OTHER): Payer: Self-pay

## 2017-06-30 VITALS — BP 126/88 | HR 97 | Ht 65.0 in | Wt 212.0 lb

## 2017-06-30 DIAGNOSIS — M79671 Pain in right foot: Secondary | ICD-10-CM

## 2017-06-30 NOTE — Patient Instructions (Signed)
Wear boot when up and ambulating.  Please call office tomorrow  if labs results are back that were ordered by primary care physician.

## 2017-06-30 NOTE — Progress Notes (Signed)
Office Visit Note   Patient: Vanessa French           Date of Birth: [birth year]           MRN: [id number] Visit Date: 06/30/2017              Requested by: Unk Pinto, [address] [place], [place] [address] PCP: Unk Pinto, MD   Assessment & Plan: Visit Diagnoses:  1. Right foot pain     Plan: Today patient was given a cam walker. 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 Follow-up in 1 week for recheck. If her labs are unremarkable I will schedule MRI right foot. I did review x-rays with Dr. Meridee Score today in our clinic. I did offer to take patient out of work a couple of days but she declined.  Follow-Up Instructions: No Follow-up on file.   Orders:  Orders Placed This Encounter  Procedures  . XR Foot Complete Right   No orders of the defined types were placed in this encounter.     Procedures: No procedures performed   Clinical Data: No additional findings.   Subjective: Chief Complaint  Patient presents with  . Right Foot - Pain    Pain across top of the right foot    HPI Patient being seen at the request of primary care physician Dr. Unk Pinto for right foot pain. Pain 1 month. Denies injury. States that this is been progressively worse the last week or so. Localizes pain around the dorsal midfoot. No previous problems before onset. Primary care physician has treated conservatively with oral prednisone and patient is also use over-the-counter ibuprofen without any improvement. Blood work drawn at PCPs office to check CBC, rheumatoid factor, uric acid. Patient states the pain worse with weightbearing and also dorsiflexion of the foot. Patient had x-rays done 06/08/2017 and report read no evidence of fracture or dislocation. Posterior and plantar heel spurs identified. Soft tissues are unremarkable. No acute findings. Review  of Systems No current cardiac pulmonary GI GU issues.  Objective: Vital Signs: BP 126/88 (BP Location: Left Arm, Patient Position: Sitting)   Pulse 97   Ht 5\' 5"  (1.651 m)   Wt 212 lb (96.2 kg)   BMI 35.28 kg/m   Physical Exam  Constitutional: She is oriented to person, place, and time. No distress.  HENT:  Head: Normocephalic and atraumatic.  Eyes: Pupils are equal, round, and reactive to light.  Neck: Normal range of motion.  Pulmonary/Chest: No respiratory distress.  Musculoskeletal:  Gait is somewhat antalgic. Left ankle good range of motion. She does have slight tenderness anterolateral joint line. She is moderate to marked tender dorsal midfoot. Increased pain with motion of the TMT joint. No swelling or bruising noted. No signs of infection. Neurovascular intact.  Neurological: She is alert and oriented to person, place, and time.  Skin: Skin is warm and dry.    Ortho Exam  Specialty Comments:  No specialty comments available.  Imaging: No results found.   PMFS History: Patient Active Problem List   Diagnosis Date Noted  . OAB (overactive bladder) 11/30/2016  . Prediabetes 11/30/2016  . History of pulmonary embolus (PE) 11/03/2012  . Anxiety 11/03/2012  . Closed fracture of right proximal humerus 04/07/2012    Class: Acute   Past Medical History:  Diagnosis Date  . Frequent UTI   .  GERD (gastroesophageal reflux disease)   . Psoriasis     Family History  Problem Relation Age of Onset  . Heart disease Mother   . Heart disease Maternal Grandfather   . Colon cancer Father     Past Surgical History:  Procedure Laterality Date  . ABDOMINAL HYSTERECTOMY    . APPENDECTOMY    . BLADDER SUSPENSION    . EXTENSOR TENDON OF FOREARM / WRIST REPAIR     LEFT ARM  . ORIF HUMERUS FRACTURE  04/07/2012  . ORIF HUMERUS FRACTURE  04/07/2012   Procedure: OPEN REDUCTION INTERNAL FIXATION (ORIF) PROXIMAL HUMERUS FRACTURE;  Surgeon: Marybelle Killings, MD;  Location: [facility];   Service: Orthopedics;  Laterality: Right;  Open Reduction Internal Fixation Right Proxmial Humerus  . STERIOD INJECTION  04/07/2012   Procedure: STEROID INJECTION;  Surgeon: Marybelle Killings, MD;  Location: [facility];  Service: Orthopedics;  Laterality: Right;   Right 1st Dorsal Compartment Injection  . TONSILLECTOMY    . URETER REVISION     COMPLIATION OF BLADDER SLING   Social History   Occupational History  . RN [employer]   Social History Main Topics  . Smoking status: Never Smoker  . Smokeless tobacco: Never Used  . Alcohol use No  . Drug use: No  . Sexual activity: Not on file

## 2017-07-01 ENCOUNTER — Ambulatory Visit
Admission: RE | Admit: 2017-07-01 | Discharge: 2017-07-01 | Disposition: A | Discharge status: Home / Self Care | Payer: Medicare HMO | Source: Ambulatory Visit | Attending: Physician Assistant | Admitting: Physician Assistant

## 2017-07-01 ENCOUNTER — Other Ambulatory Visit: Payer: Self-pay | Admitting: Internal Medicine

## 2017-07-01 ENCOUNTER — Telehealth (INDEPENDENT_AMBULATORY_CARE_PROVIDER_SITE_OTHER): Payer: Self-pay | Admitting: Surgery

## 2017-07-01 DIAGNOSIS — M79671 Pain in right foot: Secondary | ICD-10-CM

## 2017-07-01 DIAGNOSIS — Z1231 Encounter for screening mammogram for malignant neoplasm of breast: Secondary | ICD-10-CM

## 2017-07-01 LAB — CBC WITH DIFFERENTIAL/PLATELET
BASOS ABS: 52 {cells}/uL (ref 0–200)
Basophils Relative: 0.7 %
EOS ABS: 252 {cells}/uL (ref 15–500)
Eosinophils Relative: 3.4 %
HCT: 45 % (ref 35.0–45.0)
Hemoglobin: 15 g/dL (ref 11.7–15.5)
Lymphs Abs: 1931 cells/uL (ref 850–3900)
MCH: 29.3 pg (ref 27.0–33.0)
MCHC: 33.3 g/dL (ref 32.0–36.0)
MCV: 87.9 fL (ref 80.0–100.0)
MPV: 9.8 fL (ref 7.5–12.5)
Monocytes Relative: 9.4 %
NEUTROS PCT: 60.4 %
Neutro Abs: 4470 cells/uL (ref 1500–7800)
PLATELETS: 243 10*3/uL (ref 140–400)
RBC: 5.12 10*6/uL — AB (ref 3.80–5.10)
RDW: 13.4 % (ref 11.0–15.0)
TOTAL LYMPHOCYTE: 26.1 %
WBC: 7.4 10*3/uL (ref 3.8–10.8)
WBCMIX: 696 {cells}/uL (ref 200–950)

## 2017-07-01 LAB — RHEUMATOID FACTOR

## 2017-07-01 LAB — URIC ACID: Uric Acid, Serum: 5.6 mg/dL (ref 2.5–7.0)

## 2017-07-01 LAB — CYCLIC CITRUL PEPTIDE ANTIBODY, IGG: Cyclic Citrullin Peptide Ab: <16 UNITS

## 2017-07-01 LAB — SEDIMENTATION RATE: Sed Rate: 6 mm/h (ref 0–30)

## 2017-07-01 NOTE — Telephone Encounter (Signed)
MRI right foot per Jeneen Rinks

## 2017-07-01 NOTE — Telephone Encounter (Signed)
fyi

## 2017-07-01 NOTE — Telephone Encounter (Signed)
I reviewed labs.  Please schedule MRI left foot r/o stress fracture.  rov with duda after.

## 2017-07-01 NOTE — Telephone Encounter (Signed)
Patient called advised all the lab work she had done was normal. The number to contact patient is [phone]

## 2017-07-04 NOTE — Telephone Encounter (Signed)
I left voicemail for patient advising. I asked her to call in as soon as she knows appt date for MRI and schedule follow up with Dr. Sharol Given per Jeneen Rinks.

## 2017-07-04 NOTE — Addendum Note (Signed)
Addended by: Meyer Cory on: 07/04/2017 10:26 AM   Modules accepted: Orders

## 2017-07-05 ENCOUNTER — Telehealth (INDEPENDENT_AMBULATORY_CARE_PROVIDER_SITE_OTHER): Payer: Self-pay | Admitting: Orthopaedic Surgery

## 2017-07-05 NOTE — Telephone Encounter (Signed)
Patient called left voicemail message to call her concerning her MRI. Patient said it's her right foot and not her left foot that need to be scanned. The number to contact patient is [phone]

## 2017-07-05 NOTE — Telephone Encounter (Signed)
I called patient. She states that her right foot is feeling much better in the boot. She has follow up appt with you on Thursday already. She would like to know if you feel that the MRI is needed since she is feeling better or if she should just follow up with you on Thursday as scheduled. She was curious as to whether the treatment would be different if MRI did show a stress fracture.   I advised you were in surgery today and we could call her back tomorrow to let her know.

## 2017-07-06 NOTE — Progress Notes (Signed)
Pt aware of lab results & voiced understanding of those results.

## 2017-07-06 NOTE — Telephone Encounter (Signed)
If she is feeling better can hold off on MRI.  Up to her.  Recommend keeping f/u appt for repeat exam.  What would change if she has a stress fracture would be weightbearing status and decide whether or not surgical intervention is needed if she does have a fracture.

## 2017-07-07 ENCOUNTER — Ambulatory Visit (INDEPENDENT_AMBULATORY_CARE_PROVIDER_SITE_OTHER): Payer: Medicare HMO | Admitting: Surgery

## 2017-07-07 DIAGNOSIS — M62830 Muscle spasm of back: Secondary | ICD-10-CM | POA: Diagnosis not present

## 2017-07-07 DIAGNOSIS — M79671 Pain in right foot: Secondary | ICD-10-CM | POA: Diagnosis not present

## 2017-07-07 MED ORDER — METHOCARBAMOL 500 MG PO TABS: 500.0000 mg | ORAL_TABLET | Freq: Three times a day (TID) | ORAL | 0 refills | Status: DC | PRN

## 2017-07-07 NOTE — Telephone Encounter (Signed)
Unable to reach patient prior to her appt today after I received message. Patient did come for follow up appt.

## 2017-07-11 ENCOUNTER — Other Ambulatory Visit: Payer: Medicare HMO

## 2017-07-11 DIAGNOSIS — M79605 Pain in left leg: Secondary | ICD-10-CM | POA: Diagnosis not present

## 2017-07-11 DIAGNOSIS — Z86711 Personal history of pulmonary embolism: Secondary | ICD-10-CM | POA: Diagnosis not present

## 2017-07-11 NOTE — Progress Notes (Signed)
Patient has been in a right boot for possible stress fracture, has history of PE after fracture, no distal edema/swelling, no SOB/CP. Will get ddimer, if positive will start xaretlo and get [place], continue bASA.  Go to Er if any SOB/CP

## 2017-07-12 ENCOUNTER — Ambulatory Visit (HOSPITAL_COMMUNITY)
Admission: RE | Admit: 2017-07-12 | Discharge: 2017-07-12 | Disposition: A | Discharge status: Home / Self Care | Payer: Medicare HMO | Source: Ambulatory Visit | Attending: Vascular Surgery | Admitting: Vascular Surgery

## 2017-07-12 ENCOUNTER — Other Ambulatory Visit: Payer: Self-pay | Admitting: Physician Assistant

## 2017-07-12 DIAGNOSIS — M79604 Pain in right leg: Secondary | ICD-10-CM

## 2017-07-12 DIAGNOSIS — Z86711 Personal history of pulmonary embolism: Secondary | ICD-10-CM | POA: Diagnosis not present

## 2017-07-12 LAB — D-DIMER, QUANTITATIVE (NOT AT ARMC): D DIMER QUANT: 2.14 ug{FEU}/mL — AB (ref <0.50)

## 2017-07-13 ENCOUNTER — Telehealth (INDEPENDENT_AMBULATORY_CARE_PROVIDER_SITE_OTHER): Payer: Self-pay | Admitting: Surgery

## 2017-07-13 NOTE — Telephone Encounter (Signed)
error 

## 2017-07-20 ENCOUNTER — Ambulatory Visit (INDEPENDENT_AMBULATORY_CARE_PROVIDER_SITE_OTHER): Payer: Medicare HMO | Admitting: Surgery

## 2017-07-23 ENCOUNTER — Ambulatory Visit
Admission: RE | Admit: 2017-07-23 | Discharge: 2017-07-23 | Disposition: A | Discharge status: Home / Self Care | Payer: Medicare HMO | Source: Ambulatory Visit | Attending: Surgery | Admitting: Surgery

## 2017-07-23 DIAGNOSIS — M79671 Pain in right foot: Secondary | ICD-10-CM | POA: Diagnosis not present

## 2017-07-25 ENCOUNTER — Other Ambulatory Visit: Payer: Self-pay | Admitting: Internal Medicine

## 2017-07-28 ENCOUNTER — Encounter (INDEPENDENT_AMBULATORY_CARE_PROVIDER_SITE_OTHER): Payer: Self-pay | Admitting: Family

## 2017-07-28 ENCOUNTER — Ambulatory Visit (INDEPENDENT_AMBULATORY_CARE_PROVIDER_SITE_OTHER): Payer: Medicare HMO | Admitting: Orthopedic Surgery

## 2017-07-28 VITALS — Ht 65.0 in | Wt 210.0 lb

## 2017-07-28 DIAGNOSIS — M6701 Short Achilles tendon (acquired), right ankle: Secondary | ICD-10-CM

## 2017-07-28 DIAGNOSIS — G5761 Lesion of plantar nerve, right lower limb: Secondary | ICD-10-CM | POA: Diagnosis not present

## 2017-07-28 MED ORDER — LIDOCAINE HCL 1 % IJ SOLN
1.0000 mL | INTRAMUSCULAR | Status: AC | PRN
  Administered 2017-07-28: 1 mL

## 2017-07-28 MED ORDER — METHYLPREDNISOLONE ACETATE 40 MG/ML IJ SUSP
40.0000 mg | INTRAMUSCULAR | Status: AC | PRN
  Administered 2017-07-28: 40 mg via INTRA_ARTICULAR

## 2017-07-28 NOTE — Progress Notes (Addendum)
Office Visit Note   Patient: Vanessa French           Date of Birth: 06/08/1951           MRN: [id number] Visit Date: 07/28/2017              Requested by: Unk Pinto, [address] [place], [place] [address] PCP: Unk Pinto, MD  Chief Complaint  Patient presents with  . Right Foot - Follow-up, Pain    MRI review      HPI: Patient is a 66 year old woman who is been having pain in the right forefoot in the third webspace. She has tried conservative measures with anti-inflammatories shoewear and still has persistent pain she underwent an MRI scan and is seen today for initial evaluation.  Assessment & Plan: Visit Diagnoses:  1. Morton's neuroma of right foot   2. Achilles tendon contracture, right     Plan: The Morton's neuroma was injected. Recommended that she use her stiffer soled hiking shoes and recommended sole orthotics to help unload the metatarsal heads. She is also given instructions for heel cord stretching to relieve the Achilles contracture.  Follow-Up Instructions: Return if symptoms worsen or fail to improve.   Ortho Exam  Patient is alert, oriented, no adenopathy, well-dressed, normal affect, normal respiratory effort. Examination patient has a good dorsalis pedis pulse she has a normal gait she does have heel cord contracture with dorsiflexion only to neutral with her knee extended. She has no tenderness to palpation to the midfoot no tenderness to palpation over the lateral cuneiform. She does not have tenderness to palpation of the metatarsal heads but is tender to palpation in the third web space. Lateral compression reproduces a clunk and reproduces the third webspace pain. There are no plantar ulcers or calluses.  The MRI scan is reviewed which shows some edema in the lateral cuneiform bone she has no stress reaction through the forefoot she has no tenderness to palpation over the cuneiforms.  Imaging: No results found. No  images are attached to the encounter.  Labs: Lab Results  Component Value Date   HGBA1C 5.7 (H) 12/09/2015   ESRSEDRATE 6 06/30/2017   LABURIC 5.6 06/30/2017   LABORGA  01/10/2017    Multiple organisms present,each less than 10,000 CFU/mL. These organisms,commonly found on external and internal genitalia,are considered colonizers. No further testing performed.     Orders:  Orders Placed This Encounter  Procedures  . Small Joint Injection/Arthrocentesis   No orders of the defined types were placed in this encounter.    Procedures: Small Joint Inj Date/Time: 07/28/2017 1:12 PM Performed by: DUDA, MARCUS V Authorized by: Newt Minion   Consent Given by:  Patient Site marked: the procedure site was marked   Timeout: prior to procedure the correct patient, procedure, and site was verified   Indications:  Pain and diagnostic evaluation Location:  Foot Site:  R intertarsal Prep: patient was prepped and draped in usual sterile fashion   Needle Size:  22 G Spinal Needle: No   Approach:  Dorsal Ultrasound Guided: No   Fluoroscopic Guidance: No   Medications:  1 mL lidocaine 1 %; 40 mg methylPREDNISolone acetate 40 MG/ML Aspiration Attempted: No   Patient tolerance:  Patient tolerated the procedure well with no immediate complications    Clinical Data: No additional findings.  ROS:  All other systems negative, except as noted in the HPI. Review of Systems  Objective: Vital Signs: Ht 5\' 5"  (  1.651 m)   Wt 210 lb (95.3 kg)   BMI 34.95 kg/m   Specialty Comments:  No specialty comments available.  PMFS History: Patient Active Problem List   Diagnosis Date Noted  . Morton's neuroma of right foot 07/28/2017  . Achilles tendon contracture, right 07/28/2017  . OAB (overactive bladder) 11/30/2016  . Prediabetes 11/30/2016  . History of pulmonary embolus (PE) 11/03/2012  . Anxiety 11/03/2012  . Closed fracture of right proximal humerus 04/07/2012    Class: Acute    Past Medical History:  Diagnosis Date  . Frequent UTI   . GERD (gastroesophageal reflux disease)   . Psoriasis     Family History  Problem Relation Age of Onset  . Heart disease Mother   . Heart disease Maternal Grandfather   . Colon cancer Father     Past Surgical History:  Procedure Laterality Date  . ABDOMINAL HYSTERECTOMY    . APPENDECTOMY    . BLADDER SUSPENSION    . BREAST CYST ASPIRATION Right 2011  . EXTENSOR TENDON OF FOREARM / WRIST REPAIR     LEFT ARM  . ORIF HUMERUS FRACTURE  04/07/2012  . ORIF HUMERUS FRACTURE  04/07/2012   Procedure: OPEN REDUCTION INTERNAL FIXATION (ORIF) PROXIMAL HUMERUS FRACTURE;  Surgeon: Marybelle Killings, MD;  Location: [facility];  Service: Orthopedics;  Laterality: Right;  Open Reduction Internal Fixation Right Proxmial Humerus  . STERIOD INJECTION  04/07/2012   Procedure: STEROID INJECTION;  Surgeon: Marybelle Killings, MD;  Location: [facility];  Service: Orthopedics;  Laterality: Right;   Right 1st Dorsal Compartment Injection  . TONSILLECTOMY    . URETER REVISION     COMPLIATION OF BLADDER SLING   Social History   Occupational History  . RN [employer]   Social History Main Topics  . Smoking status: Never Smoker  . Smokeless tobacco: Never Used  . Alcohol use No  . Drug use: No  . Sexual activity: Not on file

## 2017-07-29 ENCOUNTER — Ambulatory Visit (INDEPENDENT_AMBULATORY_CARE_PROVIDER_SITE_OTHER): Payer: Medicare HMO | Admitting: Family

## 2017-07-29 DIAGNOSIS — N3020 Other chronic cystitis without hematuria: Secondary | ICD-10-CM | POA: Diagnosis not present

## 2017-08-02 ENCOUNTER — Ambulatory Visit (INDEPENDENT_AMBULATORY_CARE_PROVIDER_SITE_OTHER): Payer: Medicare HMO

## 2017-08-02 DIAGNOSIS — Z23 Encounter for immunization: Secondary | ICD-10-CM | POA: Diagnosis not present

## 2017-08-02 DIAGNOSIS — N390 Urinary tract infection, site not specified: Secondary | ICD-10-CM

## 2017-08-03 LAB — URINALYSIS, ROUTINE W REFLEX MICROSCOPIC
BILIRUBIN URINE: NEGATIVE
GLUCOSE, UA: NEGATIVE
Hyaline Cast: NONE SEEN /LPF
KETONES UR: NEGATIVE
NITRITE: NEGATIVE
PROTEIN: NEGATIVE
SQUAMOUS EPITHELIAL / LPF: NONE SEEN /HPF (ref <=5)
Specific Gravity, Urine: 1.005 (ref 1.001–1.03)
pH: 6.5 (ref 5.0–8.0)

## 2017-08-05 LAB — URINE CULTURE
MICRO NUMBER:: 81124505
SPECIMEN QUALITY:: ADEQUATE

## 2017-08-18 DIAGNOSIS — Z5181 Encounter for therapeutic drug level monitoring: Secondary | ICD-10-CM | POA: Diagnosis not present

## 2017-08-18 DIAGNOSIS — D2372 Other benign neoplasm of skin of left lower limb, including hip: Secondary | ICD-10-CM | POA: Diagnosis not present

## 2017-08-18 DIAGNOSIS — Z23 Encounter for immunization: Secondary | ICD-10-CM | POA: Diagnosis not present

## 2017-08-18 DIAGNOSIS — D2271 Melanocytic nevi of right lower limb, including hip: Secondary | ICD-10-CM | POA: Diagnosis not present

## 2017-08-18 DIAGNOSIS — L821 Other seborrheic keratosis: Secondary | ICD-10-CM | POA: Diagnosis not present

## 2017-08-18 DIAGNOSIS — Z808 Family history of malignant neoplasm of other organs or systems: Secondary | ICD-10-CM | POA: Diagnosis not present

## 2017-08-18 DIAGNOSIS — L409 Psoriasis, unspecified: Secondary | ICD-10-CM | POA: Diagnosis not present

## 2017-08-30 ENCOUNTER — Other Ambulatory Visit: Payer: Medicare HMO

## 2017-08-30 ENCOUNTER — Other Ambulatory Visit: Payer: Self-pay | Admitting: Internal Medicine

## 2017-08-30 DIAGNOSIS — N390 Urinary tract infection, site not specified: Secondary | ICD-10-CM | POA: Diagnosis not present

## 2017-08-31 LAB — URINALYSIS, ROUTINE W REFLEX MICROSCOPIC
BILIRUBIN URINE: NEGATIVE
Bacteria, UA: NONE SEEN /HPF
GLUCOSE, UA: NEGATIVE
HYALINE CAST: NONE SEEN /LPF
Ketones, ur: NEGATIVE
NITRITE: NEGATIVE
Protein, ur: NEGATIVE
RBC / HPF: NONE SEEN /HPF (ref 0–2)
SPECIFIC GRAVITY, URINE: 1.011 (ref 1.001–1.03)
SQUAMOUS EPITHELIAL / LPF: NONE SEEN /HPF (ref <=5)
pH: 7 (ref 5.0–8.0)

## 2017-09-01 LAB — URINE CULTURE
MICRO NUMBER:: 81247222
SPECIMEN QUALITY: ADEQUATE

## 2017-09-02 NOTE — Progress Notes (Signed)
Pt aware of lab results & voiced understanding of those results.

## 2017-10-21 ENCOUNTER — Encounter (INDEPENDENT_AMBULATORY_CARE_PROVIDER_SITE_OTHER): Payer: Self-pay | Admitting: Surgery

## 2017-10-21 NOTE — Progress Notes (Signed)
Office Visit Note   Patient: Vanessa French           Date of Birth: 22-May-1951           MRN: [id number] Visit Date: 07/07/2017              Requested by: Unk Pinto, [place], [place] [id number] PCP: Unk Pinto, MD   Assessment & Plan: Visit Diagnoses:  1. Right foot pain   2. Spasm of lumbar paraspinous muscle     Plan: Recommend getting an MRI scan right foot due to ongoing symptoms. Follow Dr. Sharol Given after. Given Mardene Celeste for Robaxin for lumbar spasms. Follow-Up Instructions: Return if symptoms worsen or fail to improve.   Orders:  No orders of the defined types were placed in this encounter.  Meds ordered this encounter  Medications  . methocarbamol (ROBAXIN) 500 MG tablet    Sig: Take 1 tablet (500 mg total) by mouth every 8 (eight) hours as needed for muscle spasms.    Dispense:  40 tablet    Refill:  0      Procedures: No procedures performed   Clinical Data: No additional findings.   Subjective: Chief Complaint  Patient presents with  . Right Foot - Pain, Follow-up    HPI Patient continues to have ongoing foot pain. Also having some lumbar spasm. No lower extremity radicular pain. Arthritis panel unremarkable. Review of Systems No cardiac pulmonary GI GU issues  Objective: Vital Signs: There were no vitals taken for this visit.  Physical Exam  Ortho Exam  Specialty Comments:  No specialty comments available.  Imaging: No results found.   PMFS History: Patient Active Problem List   Diagnosis Date Noted  . Morton's neuroma of right foot 07/28/2017  . Achilles tendon contracture, right 07/28/2017  . OAB (overactive bladder) 11/30/2016  . Prediabetes 11/30/2016  . History of pulmonary embolus (PE) 11/03/2012  . Anxiety 11/03/2012  . Closed fracture of right proximal humerus 04/07/2012    Class: Acute   Past Medical History:  Diagnosis Date  . Frequent UTI   . GERD (gastroesophageal reflux  disease)   . Psoriasis     Family History  Problem Relation Age of Onset  . Heart disease Mother   . Heart disease Maternal Grandfather   . Colon cancer Father     Past Surgical History:  Procedure Laterality Date  . ABDOMINAL HYSTERECTOMY    . APPENDECTOMY    . BLADDER SUSPENSION    . BREAST CYST ASPIRATION Right 2011  . EXTENSOR TENDON OF FOREARM / WRIST REPAIR     LEFT ARM  . ORIF HUMERUS FRACTURE  04/07/2012  . ORIF HUMERUS FRACTURE  04/07/2012   Procedure: OPEN REDUCTION INTERNAL FIXATION (ORIF) PROXIMAL HUMERUS FRACTURE;  Surgeon: Marybelle Killings, MD;  Location: [facility];  Service: Orthopedics;  Laterality: Right;  Open Reduction Internal Fixation Right Proxmial Humerus  . STERIOD INJECTION  04/07/2012   Procedure: STEROID INJECTION;  Surgeon: Marybelle Killings, MD;  Location: [facility];  Service: Orthopedics;  Laterality: Right;   Right 1st Dorsal Compartment Injection  . TONSILLECTOMY    . URETER REVISION     COMPLIATION OF BLADDER SLING   Social History   Occupational History  . Occupation: Programmer, multimedia: [employer]  Tobacco Use  . Smoking status: Never Smoker  . Smokeless tobacco: Never Used  Substance and Sexual Activity  . Alcohol use: No  . Drug use:  No  . Sexual activity: Not on file

## 2017-10-24 ENCOUNTER — Other Ambulatory Visit: Payer: Self-pay | Admitting: Internal Medicine

## 2017-10-24 DIAGNOSIS — J0410 Acute tracheitis without obstruction: Secondary | ICD-10-CM

## 2017-10-24 MED ORDER — AZITHROMYCIN 250 MG PO TABS: ORAL_TABLET | ORAL | 1 refills | Status: DC

## 2017-10-24 MED ORDER — BENZONATATE 200 MG PO CAPS: ORAL_CAPSULE | ORAL | 1 refills | Status: DC

## 2017-10-24 MED ORDER — PREDNISONE 20 MG PO TABS: ORAL_TABLET | ORAL | 0 refills | Status: DC

## 2017-10-26 ENCOUNTER — Other Ambulatory Visit: Payer: Self-pay | Admitting: Internal Medicine

## 2017-11-19 ENCOUNTER — Other Ambulatory Visit: Payer: Self-pay | Admitting: Internal Medicine

## 2017-11-19 DIAGNOSIS — R609 Edema, unspecified: Secondary | ICD-10-CM

## 2017-11-19 MED ORDER — HYDROCHLOROTHIAZIDE 25 MG PO TABS: ORAL_TABLET | ORAL | 3 refills | Status: DC

## 2017-12-14 ENCOUNTER — Encounter: Payer: Self-pay | Admitting: Adult Health

## 2017-12-14 DIAGNOSIS — E663 Overweight: Secondary | ICD-10-CM

## 2017-12-14 DIAGNOSIS — M810 Age-related osteoporosis without current pathological fracture: Secondary | ICD-10-CM | POA: Insufficient documentation

## 2017-12-14 DIAGNOSIS — Z79899 Other long term (current) drug therapy: Secondary | ICD-10-CM | POA: Insufficient documentation

## 2017-12-14 DIAGNOSIS — Z6826 Body mass index (BMI) 26.0-26.9, adult: Secondary | ICD-10-CM | POA: Insufficient documentation

## 2017-12-14 DIAGNOSIS — E559 Vitamin D deficiency, unspecified: Secondary | ICD-10-CM | POA: Insufficient documentation

## 2017-12-14 DIAGNOSIS — M8589 Other specified disorders of bone density and structure, multiple sites: Secondary | ICD-10-CM | POA: Insufficient documentation

## 2017-12-14 DIAGNOSIS — Z6827 Body mass index (BMI) 27.0-27.9, adult: Secondary | ICD-10-CM | POA: Insufficient documentation

## 2017-12-14 DIAGNOSIS — E785 Hyperlipidemia, unspecified: Secondary | ICD-10-CM | POA: Insufficient documentation

## 2017-12-14 DIAGNOSIS — Z6828 Body mass index (BMI) 28.0-28.9, adult: Secondary | ICD-10-CM | POA: Insufficient documentation

## 2017-12-14 NOTE — Progress Notes (Signed)
MEDICARE ANNUAL WELLNESS VISIT AND FOLLOW UP  Assessment:    Encounter for Annual Medicare Wellness Visit  Hypertension Continue medication: hctz 12.5-25 mg daily Monitor blood pressure at home; call if consistently over 130/80 Continue DASH diet.   Reminder to go to the ER if any CP, SOB, nausea, dizziness, severe HA, changes vision/speech, left arm numbness and tingling and jaw pain.  OAB (overactive bladder) Improved with myrbetriq 50 mg daily; continue medication Continue follow up with Dr. Louis Meckel; ? Bladder prolapse following sling procedure in 2000  Anxiety Well managed by current regimen; continue medications Stress management techniques discussed, increase water, good sleep hygiene discussed, increase exercise, and increase veggies.   History of pulmonary embolus (PE) Provoked, after humerus fracture; was on coumadin but has been tapered off with no recurrent issues.   Prediabetes Discussed disease and risks of elevated glucose Discussed diet/exercise, weight management  -     Hemoglobin A1c   Obesity (BMI 33.25 ) Long discussion about weight loss, diet, and exercise Recommended diet heavy in fruits and veggies and low in animal meats, cheeses, and dairy products, appropriate calorie intake Discussed appropriate weight for height  Patient will work on restarting exercise, resume walking for 10000 steps daily Will start the patient on phentermine- hand out given and AE's discussed, will do close follow up.  Follow up at next visit  Hyperipidemia Very mild elevation currently treated by lifestyle only Continue low cholesterol diet and exercise.  -     Lipid panel -     TSH  Vitamin D deficiency -     VITAMIN D 25 Hydroxy (Vit-D Deficiency, Fractures)  Medication management -     CBC with Differential/Platelet -     BASIC METABOLIC PANEL WITH GFR -     Hepatic function panel  Osteopenia of multiple sites Due for repeat Dexa Fall 2019; continues with fosamax  without issues following an 18 month on, 6 month off schedule per recommendation by ortho. Will follow up in 6 months at [facility].   Psoriasis Previously treated by methotrexate but has been tapered off by derm; doing well without significant flares.   Need for pneumococcal vaccination Prevnar 13 administered today  Over 40 minutes of exam, counseling, chart review and critical decision making was performed   Plan:   During the course of the visit the patient was educated and counseled about appropriate screening and preventive services including:    Pneumococcal vaccine   Prevnar 13  Influenza vaccine  Td vaccine  Screening electrocardiogram  Bone densitometry screening  Colorectal cancer screening  Diabetes screening  Glaucoma screening  Nutrition counseling   Advanced directives: requested   Subjective:  Vanessa French is a 67 y.o. female who presents for Medicare Annual Wellness Visit and 3 month follow up.   She continues on fosamax for osteoporosis; most recent DEXA in 06/2016 demonstrated improved T score of -1.8 at left femur. Following an 53-month on 30-month off schedule per recommendation by ortho and is doing well without SE or concerns.   Has hx of vaginal hysterectomy and had  bladder sling in 2000; has had frequent UTIs in the past with ?cystocele and was referred back to urology at the last visit; was treated for OAB by vesicare but reports she has transitioned to myrbetriq 25 mg then 50 mg with much better control. Followed by Dr. Louis Meckel for ongoing bladder/urinary issues. ?relapse of bladder prolapse, discussed possible benefit of pessary.   BMI is Body mass index is  33.25 kg/m., she has not been working on diet and exercise. She reports her husband was diagnosed with Parkinson's this year and has fallen out of habit with daily walking. Plans to restart. For diet, typically splits an entree with husband. Tends to eat too late at night. Drinks 4 bottles of  water daily. Has successfully lost 50 lb in the past.  Wt Readings from Last 3 Encounters:  12/15/17 206 lb (93.4 kg)  07/28/17 210 lb (95.3 kg)  06/30/17 212 lb (96.2 kg)    Her blood pressure has been controlled at home, today their BP is BP: 124/80 She does not workout. She denies chest pain, shortness of breath, dizziness.   She is not on cholesterol medication and denies myalgias. Her cholesterol is not at goal. The cholesterol last visit was:   Lab Results  Component Value Date   CHOL 184 07/31/2014   HDL 60 07/31/2014   LDLCALC 107 (H) 07/31/2014   TRIG 84 07/31/2014   CHOLHDL 3.1 07/31/2014    She has not been working on diet and exercise for prediabetes, and denies increased appetite, nausea, paresthesia of the feet, polydipsia, polyuria, visual disturbances, vomiting and weight loss. Last A1C in the office was:  Lab Results  Component Value Date   HGBA1C 5.7 (H) 12/09/2015   Last GFR: Lab Results  Component Value Date   [facility] >89 11/30/2016   Patient is on Vitamin D supplement and was above goal at the last check which was several years ago:   Lab Results  Component Value Date   VD25OH 114 (H) 07/31/2014      Medication Review: Current Outpatient Medications on File Prior to Visit  Medication Sig Dispense Refill  . alendronate (FOSAMAX) 70 MG tablet TAKE 1 TABLET (70 MG TOTAL) BY MOUTH EVERY 7 (SEVEN) DAYS. TAKE WITH AFULL GLASS OF WATER ON AN EMPTY STOMACH. 12 tablet 97  . aspirin EC 81 MG tablet Take 81 mg by mouth daily.    . calcipotriene (DOVONOX) 0.005 % cream Apply 1 application topically at bedtime.    . calcium carbonate (TUMS EX) 750 MG chewable tablet Chew 1 tablet by mouth daily.    . Cholecalciferol (VITAMIN D-3) 5000 UNITS TABS Take 5,000 Units by mouth 2 (two) times daily.    Marland Kitchen CRANBERRY PO Take by mouth. Take 2 caplets BID    . hydrochlorothiazide (HYDRODIURIL) 25 MG tablet Take 1 /2 to 1 tablet daily for BP  & Fluid 90 tablet 3  . Mirabegron  (MYRBETRIQ PO) Take 50 mg by mouth daily.    . Multiple Vitamin (MULTI-VITAMIN DAILY PO) Take 1 tablet by mouth daily.    Marland Kitchen omeprazole (PRILOSEC) 40 MG capsule Take 1 capsule (40 mg total) by mouth daily. 90 capsule 1  . Probiotic Product (PROBIOTIC DAILY PO) Take by mouth. 1 capsule daily    . sertraline (ZOLOFT) 25 MG tablet TAKE ONE TABLET BY MOUTH DAILY 90 tablet 3  . CINNAMON PO Take 1,000 mg by mouth 2 (two) times daily.    Marland Kitchen FOLIC ACID PO Take 1 tablet by mouth daily.    . meclizine (ANTIVERT) 25 MG tablet Take 1 tablet (25 mg total) by mouth 3 (three) times daily as needed for dizziness. (Patient not taking: Reported on 12/15/2017) 90 tablet 0  . methocarbamol (ROBAXIN) 500 MG tablet Take 1 tablet (500 mg total) by mouth every 8 (eight) hours as needed for muscle spasms. (Patient not taking: Reported on 12/15/2017) 40 tablet 0  .  solifenacin (VESICARE) 5 MG tablet Take 5 mg by mouth daily.     . vitamin C (ASCORBIC ACID) 500 MG tablet Take 500 mg by mouth 2 (two) times daily.     No current facility-administered medications on file prior to visit.     Allergies  Allergen Reactions  . Penicillins Other (See Comments)    Yeast infection  . Latex Rash    Current Problems (verified) Patient Active Problem List   Diagnosis Date Noted  . Medication management 12/14/2017  . Osteopenia of multiple sites 12/14/2017  . Obesity (BMI 30.0-34.9) 12/14/2017  . Hyperlipidemia 12/14/2017  . Vitamin D deficiency 12/14/2017  . Morton's neuroma of right foot 07/28/2017  . Achilles tendon contracture, right 07/28/2017  . OAB (overactive bladder) 11/30/2016  . Prediabetes 11/30/2016  . History of pulmonary embolus (PE) 11/03/2012  . Anxiety 11/03/2012    Screening Tests Immunization History  Administered Date(s) Administered  . Hepatitis B 01/11/2013  . Influenza Split 07/25/2012, 08/12/2014, 08/21/2015  . Influenza, High Dose Seasonal PF 08/02/2017  . Influenza,inj,quad, With  Preservative 08/13/2016  . PPD Test 07/31/2014, 07/28/2015, 08/13/2016  . Pneumococcal Conjugate-13 12/15/2017  . Pneumococcal Polysaccharide-23 04/16/2002  . Tdap 12/21/2011  . Zoster 10/26/2003   Preventative care: Tetanus: 2014 Pneumovax: 1996, 2003 Prevnar 13: 2019 Flu vaccine: 2018 Zostavax: 2005   Pap: S/p hysterectomy, last pelvic remote  Last colonoscopy: 2014? No reports, repeat 5 years due this fall -  Mammogram: 06/2017 Dexa: 2017 due fall 2019  Names of Other Physician/Practitioners you currently use: 1. [facility] here for primary care 2. Dr. Prudencio Burly, eye doctor, last visit 2018, has appointment 12/2017 3. Dr. Colin Benton , dentist, last visit 2018  Patient Care Team: Unk Pinto, MD as PCP - General (Internal Medicine) Jari Pigg, MD as Consulting Physician (Dermatology) Marybelle Killings, MD as Consulting Physician (Orthopedic Surgery)  SURGICAL HISTORY She  has a past surgical history that includes Tonsillectomy (1962); Appendectomy (1959); Bladder suspension (11/1998); Ureter revision (Left, 11/3003); Extensor tendon of forearm / wrist repair (Left, 2010); ORIF humerus fracture (04/07/2012); Steriod injection (04/07/2012); Breast cyst aspiration (Right, 2011); and vaginal hysterectomy with unilateral salpingooopherectomy (2000). FAMILY HISTORY Her family history includes Colon cancer (age of onset: 95) in her father; Heart disease in her maternal grandfather and mother; Heart failure in her maternal grandfather. SOCIAL HISTORY She  reports that  has never smoked. she has never used smokeless tobacco. She reports that she does not drink alcohol or use drugs.   MEDICARE WELLNESS OBJECTIVES: Physical activity: Current Exercise Habits: The patient does not participate in regular exercise at present, Exercise limited by: None identified Cardiac risk factors: Cardiac Risk Factors include: advanced age (>72men, >51  women);dyslipidemia;hypertension;obesity (BMI >30kg/m2);sedentary lifestyle Depression/mood screen:   Depression screen [facility] 2/9 12/15/2017  Decreased Interest 0  Down, Depressed, Hopeless 0  PHQ - 2 Score 0    ADLs:  In your present state of health, do you have any difficulty performing the following activities: 12/15/2017  Hearing? N  Vision? N  Comment Doing well post cataract surgery  Difficulty concentrating or making decisions? N  Walking or climbing stairs? N  Dressing or bathing? N  Doing errands, shopping? N  Some recent data might be hidden     Cognitive Testing  Alert? Yes  Normal Appearance?Yes  Oriented to person? Yes  Place? Yes   Time? Yes  Recall of three objects?  Yes  Can perform simple calculations? Yes  Displays appropriate  judgment?Yes  Can read the correct time from a watch face?Yes  EOL planning: Does Patient Have a Medical Advance Directive?: Yes Type of Advance Directive: Healthcare Power of Attorney, Living will Does patient want to make changes to medical advance directive?: No - Patient declined Copy of [place] in Chart?: No - copy requested  Review of Systems  Constitutional: Negative for chills, fever, malaise/fatigue and weight loss.  HENT: Negative for hearing loss and tinnitus.   Eyes: Negative for blurred vision and double vision.  Respiratory: Negative for cough, shortness of breath and wheezing.   Cardiovascular: Negative for chest pain, palpitations, orthopnea, claudication and leg swelling.  Gastrointestinal: Negative for abdominal pain, blood in stool, constipation, diarrhea, heartburn, melena, nausea and vomiting.  Genitourinary: Positive for frequency and urgency. Negative for dysuria, flank pain and hematuria.  Musculoskeletal: Negative for falls, joint pain and myalgias.  Skin: Negative for rash.  Neurological: Negative for dizziness, tingling, sensory change, weakness and headaches.  Endo/Heme/Allergies:  Negative for polydipsia.  Psychiatric/Behavioral: Negative.  Negative for depression, memory loss and substance abuse. The patient is not nervous/anxious and does not have insomnia.   All other systems reviewed and are negative.    Objective:     Today's Vitals   12/15/17 1339  BP: 124/80  Pulse: 87  Temp: 97.9 F (36.6 C)  SpO2: 97%  Weight: 206 lb (93.4 kg)  Height: 5\' 6"  (1.676 m)   Body mass index is 33.25 kg/m.  General appearance: alert, no distress, WD/WN, female HEENT: normocephalic, sclerae anicteric, TMs pearly, nares patent, no discharge or erythema, pharynx normal Oral cavity: MMM, no lesions Neck: supple, no lymphadenopathy, no thyromegaly, no masses Heart: RRR, normal S1, S2, no murmurs Lungs: CTA bilaterally, no wheezes, rhonchi, or rales Abdomen: +bs, soft, non tender, non distended, no masses, no hepatomegaly, no splenomegaly Musculoskeletal: nontender, no swelling, no obvious deformity Extremities: no edema, no cyanosis, no clubbing Pulses: 2+ symmetric, upper and lower extremities, normal cap refill Neurological: alert, oriented x 3, CN2-12 intact, strength normal upper extremities and lower extremities, sensation normal throughout, DTRs 2+ throughout, no cerebellar signs, gait normal Psychiatric: normal affect, behavior normal, pleasant   Medicare Attestation I have personally reviewed: The patient's medical and social history Their use of alcohol, tobacco or illicit drugs Their current medications and supplements The patient's functional ability including ADLs,fall risks, home safety risks, cognitive, and hearing and visual impairment Diet and physical activities Evidence for depression or mood disorders  The patient's weight, height, BMI, and visual acuity have been recorded in the chart.  I have made referrals, counseling, and provided education to the patient based on review of the above and I have provided the patient with a written personalized  care plan for preventive services.     Izora Ribas, NP   12/15/2017

## 2017-12-14 NOTE — Progress Notes (Deleted)
Complete Physical  Assessment and Plan:  Diagnoses and all orders for this visit:  Encounter for general adult medical examination with abnormal findings  Morton's neuroma of right foot   Achilles tendon contracture, right Followed by Dr. Sharol Given; stretches were advised - ***  OAB (overactive bladder) Improved with vesicare; continue medication  Anxiety Well managed by current regimen; continue medications Stress management techniques discussed, increase water, good sleep hygiene discussed, increase exercise, and increase veggies.   History of pulmonary embolus (PE) Provoked, after femoral fracture and surgery; no recurrent issues.   Prediabetes Discussed disease and risks of elevated glucose Discussed diet/exercise, weight management  -     Hemoglobin A1c   Obesity (BMI *** ) Long discussion about weight loss, diet, and exercise Recommended diet heavy in fruits and veggies and low in animal meats, cheeses, and dairy products, appropriate calorie intake Patient will work on *** Discussed appropriate weight for height (below***) and initial goal (***) Follow up at next visit  Screening cholesterol level -     Lipid panel  Screening for cardiovascular condition -     EKG 12-Lead  Screening for deficiency anemia -     Vitamin B12  Screening for hematuria or proteinuria -     Urinalysis w microscopic + reflex cultur -     Microalbumin / creatinine urine ratio  Screening for thyroid disorder -     TSH  Vitamin D deficiency -     VITAMIN D 25 Hydroxy (Vit-D Deficiency, Fractures)  Medication management -     CBC with Differential/Platelet -     BASIC METABOLIC PANEL WITH GFR -     Hepatic function panel  Osteopenia of multiple sites Due for repeat Dexa Fall 2019; continues with fosamax without issues ***    Discussed med's effects and SE's. Screening labs and tests as requested with regular follow-up as recommended. Over 40 minutes of exam, counseling, chart  review, and complex, high level critical decision making was performed this visit.  Future Appointments  Date Time Provider [place]  12/15/2017  4:00 PM [name], NP [facility] None     HPI  67 y.o. female  presents for a complete physical and follow up for has History of pulmonary embolus (PE); Anxiety; OAB (overactive bladder); Prediabetes; Morton's neuroma of right foot; and Achilles tendon contracture, right on their problem list. She continues on fosamax for osteoporosis; most recent DEXA in 06/2016 demonstrated improved T score of -1.8 at left femur. Has hx of vaginal hysterectomy and had  bladder sling in 2000; has had frequent UTIs in the past with ?cystocele and was referred to urogyn at the last visit; currently treated for OAB by vesicare.   Has been evaluated for Morton's neuroma and Achilles tendon contracture by Dr. Sharol Given   The Morton's neuroma was injected. Recommended that she use her stiffer soled hiking shoes and recommended sole orthotics to help unload the metatarsal heads. She is also given instructions for heel cord stretching to relieve the Achilles contracture.    Her blood pressure {HAS HAS NOT:18834} been controlled at home, today their BP is   She {DOES_DOES UXN:23557} workout. She denies chest pain, shortness of breath, dizziness.   She {ACTION; IS/IS DUK:02542706} on cholesterol medication and denies myalgias. Her cholesterol {ACTION; IS/IS NOT:21021397} at goal. The cholesterol last visit was:   Lab Results  Component Value Date   CHOL 184 07/31/2014   HDL 60 07/31/2014   LDLCALC 107 (H) 07/31/2014   TRIG 84  07/31/2014   CHOLHDL 3.1 07/31/2014    She {Has/has not:18111} been working on diet and exercise for prediabetes, she is on bASA, she {ACTION; IS/IS NOT:21021397} on ACE/ARB and denies {Symptoms; diabetes w/o none:19199}. Last A1C in the office was:  Lab Results  Component Value Date   HGBA1C 5.7 (H) 12/09/2015   Last GFR: Lab  Results  Component Value Date   [facility] >89 11/30/2016   Patient is on Vitamin D supplement and above goal at the last check which was remote:  Lab Results  Component Value Date   VD25OH 114 (H) 07/31/2014      Current Medications:  Current Outpatient Medications on File Prior to Visit  Medication Sig Dispense Refill  . alendronate (FOSAMAX) 70 MG tablet TAKE 1 TABLET (70 MG TOTAL) BY MOUTH EVERY 7 (SEVEN) DAYS. TAKE WITH AFULL GLASS OF WATER ON AN EMPTY STOMACH. 12 tablet 97  . aspirin EC 81 MG tablet Take 81 mg by mouth daily.    Marland Kitchen azithromycin (ZITHROMAX) 250 MG tablet Take 2 tablets (500 mg) on  Day 1,  followed by 1 tablet (250 mg) once daily on Days 2 through 5. 6 each 1  . benzonatate (TESSALON) 200 MG capsule Take 1 pere 3 x / day to prevent cough 30 capsule 1  . calcipotriene (DOVONOX) 0.005 % cream Apply 1 application topically at bedtime.    . calcium carbonate (TUMS EX) 750 MG chewable tablet Chew 1 tablet by mouth daily.    . Cholecalciferol (VITAMIN D-3) 5000 UNITS TABS Take 5,000 Units by mouth 2 (two) times daily.    Marland Kitchen CINNAMON PO Take 1,000 mg by mouth 2 (two) times daily.    Marland Kitchen FOLIC ACID PO Take 1 tablet by mouth daily.    . hydrochlorothiazide (HYDRODIURIL) 25 MG tablet Take 1 /2 to 1 tablet daily for BP  & Fluid 90 tablet 3  . meclizine (ANTIVERT) 25 MG tablet Take 1 tablet (25 mg total) by mouth 3 (three) times daily as needed for dizziness. 90 tablet 0  . methocarbamol (ROBAXIN) 500 MG tablet Take 1 tablet (500 mg total) by mouth every 8 (eight) hours as needed for muscle spasms. 40 tablet 0  . methotrexate (RHEUMATREX) 2.5 MG tablet TAKE 3 TABLET BY MOUTH ONCE A WEEK FOR 4 WEEKS  2  . Multiple Vitamin (MULTI-VITAMIN DAILY PO) Take 1 tablet by mouth daily.    Marland Kitchen omeprazole (PRILOSEC) 40 MG capsule Take 1 capsule (40 mg total) by mouth daily. 90 capsule 1  . predniSONE (DELTASONE) 20 MG tablet 1 tab 3 x day for 3 days, then 1 tab 2 x day for 3 days, then 1 tab 1 x day  for 5 days 20 tablet 0  . sertraline (ZOLOFT) 25 MG tablet TAKE ONE TABLET BY MOUTH DAILY 90 tablet 3  . solifenacin (VESICARE) 5 MG tablet Take 5 mg by mouth daily.     . vitamin C (ASCORBIC ACID) 500 MG tablet Take 500 mg by mouth 2 (two) times daily.     No current facility-administered medications on file prior to visit.    Allergies:  Allergies  Allergen Reactions  . Penicillins Other (See Comments)    Yeast infection  . Latex Rash   Medical History:  She has History of pulmonary embolus (PE); Anxiety; OAB (overactive bladder); Prediabetes; Morton's neuroma of right foot; and Achilles tendon contracture, right on their problem list. Health Maintenance:   Immunization History  Administered Date(s) Administered  . DTaP  08/04/2013  . Hepatitis B 01/11/2013  . Influenza Split 07/25/2012, 08/12/2014, 08/21/2015  . Influenza, High Dose Seasonal PF 08/02/2017  . Influenza,inj,quad, With Preservative 08/13/2016  . PPD Test 07/31/2014, 07/28/2015, 08/13/2016  . Pneumococcal Conjugate-13 10/25/2004  . Zoster 10/26/2003   Preventative care: Tetanus: 2014 Pneumovax:  DUE Prevnar 13: 2006 Flu vaccine: 2018 Zostavax: 2005  Pap:  Last colonoscopy: 2014? No reports, repeat 5 years DUE Mammogram: 06/2017 Dexa: 2017 due fall 2019  Names of Other Physician/Practitioners you currently use: 1. [facility] here for primary care 2. Dr. Prudencio Burly, eye doctor, last visit 2017 3. Dr. Colin Benton , dentist, last visit 2017  Patient Care Team: Unk Pinto, MD as PCP - General (Internal Medicine) Jari Pigg, MD as Consulting Physician (Dermatology) Marybelle Killings, MD as Consulting Physician (Orthopedic Surgery)  Surgical History:  She has a past surgical history that includes Tonsillectomy; Appendectomy; Abdominal hysterectomy; Bladder suspension; Ureter revision; Extensor tendon of forearm / wrist repair; ORIF humerus fracture (04/07/2012); ORIF humerus  fracture (04/07/2012); Steriod injection (04/07/2012); and Breast cyst aspiration (Right, 2011). Family History:  Herfamily history includes Colon cancer in her father; Heart disease in her maternal grandfather and mother. Social History:  She reports that  has never smoked. she has never used smokeless tobacco. She reports that she does not drink alcohol or use drugs.  Review of Systems: ROS  Physical Exam: Estimated body mass index is 34.95 kg/m as calculated from the following:   Height as of 07/28/17: 5\' 5"  (1.651 m).   Weight as of 07/28/17: 210 lb (95.3 kg). There were no vitals taken for this visit. General Appearance: Well nourished, in no apparent distress.  Eyes: PERRLA, EOMs, conjunctiva no swelling or erythema, normal fundi and vessels.  Sinuses: No Frontal/maxillary tenderness  ENT/Mouth: Ext aud canals clear, normal light reflex with TMs without erythema, bulging. Good dentition. No erythema, swelling, or exudate on post pharynx. Tonsils not swollen or erythematous. Hearing normal.  Neck: Supple, thyroid normal. No bruits  Respiratory: Respiratory effort normal, BS equal bilaterally without rales, rhonchi, wheezing or stridor.  Cardio: RRR without murmurs, rubs or gallops. Brisk peripheral pulses without edema.  Chest: symmetric, with normal excursions and percussion.  Breasts: Symmetric, without lumps, nipple discharge, retractions.  Abdomen: Soft, nontender, no guarding, rebound, hernias, masses, or organomegaly.  Lymphatics: Non tender without lymphadenopathy.  Genitourinary:  Musculoskeletal: Full ROM all peripheral extremities,5/5 strength, and normal gait.  Skin: Warm, dry without rashes, lesions, ecchymosis. Neuro: Cranial nerves intact, reflexes equal bilaterally. Normal muscle tone, no cerebellar symptoms. Sensation intact.  Psych: Awake and oriented X 3, normal affect, Insight and Judgment appropriate.   EKG: WNL no ST changes.  Gorden Harms Tamasha Laplante 2:01  PM [facility]

## 2017-12-15 ENCOUNTER — Encounter: Payer: Self-pay | Admitting: Adult Health

## 2017-12-15 ENCOUNTER — Ambulatory Visit (INDEPENDENT_AMBULATORY_CARE_PROVIDER_SITE_OTHER): Payer: Medicare HMO | Admitting: Adult Health

## 2017-12-15 VITALS — BP 124/80 | HR 87 | Temp 97.9°F | Ht 66.0 in | Wt 206.0 lb

## 2017-12-15 DIAGNOSIS — Z0001 Encounter for general adult medical examination with abnormal findings: Secondary | ICD-10-CM

## 2017-12-15 DIAGNOSIS — E559 Vitamin D deficiency, unspecified: Secondary | ICD-10-CM

## 2017-12-15 DIAGNOSIS — L409 Psoriasis, unspecified: Secondary | ICD-10-CM

## 2017-12-15 DIAGNOSIS — E785 Hyperlipidemia, unspecified: Secondary | ICD-10-CM | POA: Diagnosis not present

## 2017-12-15 DIAGNOSIS — R7303 Prediabetes: Secondary | ICD-10-CM

## 2017-12-15 DIAGNOSIS — I10 Essential (primary) hypertension: Secondary | ICD-10-CM

## 2017-12-15 DIAGNOSIS — Z86711 Personal history of pulmonary embolism: Secondary | ICD-10-CM

## 2017-12-15 DIAGNOSIS — N3281 Overactive bladder: Secondary | ICD-10-CM

## 2017-12-15 DIAGNOSIS — R6889 Other general symptoms and signs: Secondary | ICD-10-CM

## 2017-12-15 DIAGNOSIS — E669 Obesity, unspecified: Secondary | ICD-10-CM

## 2017-12-15 DIAGNOSIS — M8589 Other specified disorders of bone density and structure, multiple sites: Secondary | ICD-10-CM | POA: Diagnosis not present

## 2017-12-15 DIAGNOSIS — F419 Anxiety disorder, unspecified: Secondary | ICD-10-CM | POA: Diagnosis not present

## 2017-12-15 DIAGNOSIS — G5761 Lesion of plantar nerve, right lower limb: Secondary | ICD-10-CM

## 2017-12-15 DIAGNOSIS — M6701 Short Achilles tendon (acquired), right ankle: Secondary | ICD-10-CM

## 2017-12-15 DIAGNOSIS — Z23 Encounter for immunization: Secondary | ICD-10-CM

## 2017-12-15 DIAGNOSIS — Z79899 Other long term (current) drug therapy: Secondary | ICD-10-CM

## 2017-12-15 DIAGNOSIS — R69 Illness, unspecified: Secondary | ICD-10-CM | POA: Diagnosis not present

## 2017-12-15 DIAGNOSIS — Z0000 Encounter for general adult medical examination without abnormal findings: Secondary | ICD-10-CM

## 2017-12-15 MED ORDER — PHENTERMINE HCL 37.5 MG PO TABS: ORAL_TABLET | ORAL | 5 refills | Status: DC

## 2017-12-20 ENCOUNTER — Other Ambulatory Visit: Payer: Self-pay | Admitting: Adult Health

## 2017-12-20 DIAGNOSIS — N3000 Acute cystitis without hematuria: Secondary | ICD-10-CM

## 2017-12-20 LAB — CBC WITH DIFFERENTIAL/PLATELET
Basophils Absolute: 38 cells/uL (ref 0–200)
Basophils Relative: 0.7 %
Eosinophils Absolute: 119 cells/uL (ref 15–500)
Eosinophils Relative: 2.2 %
HEMATOCRIT: 45.2 % — AB (ref 35.0–45.0)
HEMOGLOBIN: 15.4 g/dL (ref 11.7–15.5)
LYMPHS ABS: 1620 {cells}/uL (ref 850–3900)
MCH: 29.7 pg (ref 27.0–33.0)
MCHC: 34.1 g/dL (ref 32.0–36.0)
MCV: 87.3 fL (ref 80.0–100.0)
MONOS PCT: 9.1 %
MPV: 9.8 fL (ref 7.5–12.5)
NEUTROS ABS: 3132 {cells}/uL (ref 1500–7800)
NEUTROS PCT: 58 %
Platelets: 218 10*3/uL (ref 140–400)
RBC: 5.18 10*6/uL — AB (ref 3.80–5.10)
RDW: 13 % (ref 11.0–15.0)
Total Lymphocyte: 30 %
WBC mixed population: 491 cells/uL (ref 200–950)
WBC: 5.4 10*3/uL (ref 3.8–10.8)

## 2017-12-20 LAB — LIPID PANEL
CHOLESTEROL: 205 mg/dL — AB (ref <200)
HDL: 64 mg/dL (ref >50)
LDL CHOLESTEROL (CALC): 124 mg/dL — AB
Non-HDL Cholesterol (Calc): 141 mg/dL (calc) — ABNORMAL HIGH (ref <130)
Total CHOL/HDL Ratio: 3.2 (calc) (ref <5.0)
Triglycerides: 73 mg/dL (ref <150)

## 2017-12-20 LAB — URINE CULTURE
MICRO NUMBER:: 90235908
SPECIMEN QUALITY:: ADEQUATE

## 2017-12-20 LAB — BASIC METABOLIC PANEL WITH GFR
BUN: 10 mg/dL (ref 7–25)
CALCIUM: 9.6 mg/dL (ref 8.6–10.4)
CHLORIDE: 102 mmol/L (ref 98–110)
CO2: 32 mmol/L (ref 20–32)
Creat: 0.61 mg/dL (ref 0.50–0.99)
GFR, EST NON AFRICAN AMERICAN: 94 mL/min/{1.73_m2} (ref >=60)
GFR, Est African American: 109 mL/min/{1.73_m2} (ref >=60)
Glucose, Bld: 121 mg/dL — ABNORMAL HIGH (ref 65–99)
POTASSIUM: 4.3 mmol/L (ref 3.5–5.3)
Sodium: 142 mmol/L (ref 135–146)

## 2017-12-20 LAB — URINALYSIS W MICROSCOPIC + REFLEX CULTURE
Bacteria, UA: NONE SEEN /HPF
Bilirubin Urine: NEGATIVE
GLUCOSE, UA: NEGATIVE
HGB URINE DIPSTICK: NEGATIVE
Hyaline Cast: NONE SEEN /LPF
KETONES UR: NEGATIVE
Nitrites, Initial: NEGATIVE
PH: 8 (ref 5.0–8.0)
Protein, ur: NEGATIVE
SPECIFIC GRAVITY, URINE: 1.011 (ref 1.001–1.03)
Squamous Epithelial / LPF: NONE SEEN /HPF (ref <=5)

## 2017-12-20 LAB — HEPATIC FUNCTION PANEL
AG Ratio: 1.2 (calc) (ref 1.0–2.5)
ALBUMIN MSPROF: 4.1 g/dL (ref 3.6–5.1)
ALT: 13 U/L (ref 6–29)
AST: 14 U/L (ref 10–35)
Alkaline phosphatase (APISO): 59 U/L (ref 33–130)
BILIRUBIN TOTAL: 0.7 mg/dL (ref 0.2–1.2)
Bilirubin, Direct: 0.2 mg/dL (ref 0.0–0.2)
Globulin: 3.5 g/dL (calc) (ref 1.9–3.7)
Indirect Bilirubin: 0.5 mg/dL (calc) (ref 0.2–1.2)
TOTAL PROTEIN: 7.6 g/dL (ref 6.1–8.1)

## 2017-12-20 LAB — HEMOGLOBIN A1C
EAG (MMOL/L): 6.5 (calc)
Hgb A1c MFr Bld: 5.7 % of total Hgb — ABNORMAL HIGH (ref <5.7)
Mean Plasma Glucose: 117 (calc)

## 2017-12-20 LAB — TSH: TSH: 1.2 mIU/L (ref 0.40–4.50)

## 2017-12-20 LAB — VITAMIN D 25 HYDROXY (VIT D DEFICIENCY, FRACTURES): VIT D 25 HYDROXY: 91 ng/mL (ref 30–100)

## 2017-12-20 LAB — CULTURE INDICATED

## 2017-12-20 MED ORDER — AMPICILLIN 500 MG PO CAPS: 500.0000 mg | ORAL_CAPSULE | Freq: Four times a day (QID) | ORAL | 0 refills | Status: DC

## 2017-12-31 ENCOUNTER — Other Ambulatory Visit: Payer: Self-pay | Admitting: Internal Medicine

## 2017-12-31 MED ORDER — PREDNISONE 20 MG PO TABS: ORAL_TABLET | ORAL | 0 refills | Status: DC

## 2017-12-31 MED ORDER — LEVOFLOXACIN 500 MG PO TABS: ORAL_TABLET | ORAL | 1 refills | Status: DC

## 2017-12-31 MED ORDER — FLUCONAZOLE 150 MG PO TABS: ORAL_TABLET | ORAL | 1 refills | Status: DC

## 2018-01-06 DIAGNOSIS — H40053 Ocular hypertension, bilateral: Secondary | ICD-10-CM | POA: Diagnosis not present

## 2018-01-06 DIAGNOSIS — H524 Presbyopia: Secondary | ICD-10-CM | POA: Diagnosis not present

## 2018-01-23 ENCOUNTER — Other Ambulatory Visit: Payer: Self-pay

## 2018-01-27 ENCOUNTER — Other Ambulatory Visit: Payer: Self-pay | Admitting: Adult Health

## 2018-01-27 ENCOUNTER — Other Ambulatory Visit: Payer: Medicare HMO

## 2018-01-27 DIAGNOSIS — R300 Dysuria: Secondary | ICD-10-CM

## 2018-01-27 NOTE — Progress Notes (Signed)
Patient c/o dysuria with hx of recurrent UTI. Urinalysis with reflex culture order placed.

## 2018-01-29 LAB — URINALYSIS W MICROSCOPIC + REFLEX CULTURE
BILIRUBIN URINE: NEGATIVE
Glucose, UA: NEGATIVE
KETONES UR: NEGATIVE
NITRITES URINE, INITIAL: NEGATIVE
PH: 7 (ref 5.0–8.0)
Protein, ur: NEGATIVE
Specific Gravity, Urine: 1.019 (ref 1.001–1.03)
Squamous Epithelial / LPF: NONE SEEN /HPF (ref <=5)

## 2018-01-29 LAB — URINE CULTURE
MICRO NUMBER:: 90427664
SPECIMEN QUALITY:: ADEQUATE

## 2018-01-29 LAB — CULTURE INDICATED

## 2018-02-22 DIAGNOSIS — L409 Psoriasis, unspecified: Secondary | ICD-10-CM | POA: Diagnosis not present

## 2018-02-24 ENCOUNTER — Other Ambulatory Visit: Payer: Self-pay

## 2018-02-24 MED ORDER — OMEPRAZOLE 40 MG PO CPDR: 40.0000 mg | DELAYED_RELEASE_CAPSULE | Freq: Every day | ORAL | 3 refills | Status: DC

## 2018-03-16 ENCOUNTER — Other Ambulatory Visit: Payer: Self-pay | Admitting: Internal Medicine

## 2018-03-16 MED ORDER — PREDNISOLONE ACETATE 1 % OP SUSP: OPHTHALMIC | 0 refills | Status: DC

## 2018-06-07 ENCOUNTER — Other Ambulatory Visit: Payer: Self-pay | Admitting: Adult Health

## 2018-06-07 DIAGNOSIS — M8580 Other specified disorders of bone density and structure, unspecified site: Secondary | ICD-10-CM

## 2018-06-07 DIAGNOSIS — E2839 Other primary ovarian failure: Secondary | ICD-10-CM

## 2018-06-09 ENCOUNTER — Other Ambulatory Visit: Payer: Self-pay | Admitting: Adult Health

## 2018-06-09 DIAGNOSIS — Z1231 Encounter for screening mammogram for malignant neoplasm of breast: Secondary | ICD-10-CM

## 2018-06-22 ENCOUNTER — Encounter: Payer: Self-pay | Admitting: Adult Health

## 2018-07-12 NOTE — Progress Notes (Signed)
Complete Physical  Assessment and Plan:  Encounter for Annual Complete Physical without Abnormal Findings  Hypertension Continue medication: hctz 12.5-25 mg daily Monitor blood pressure at home; call if consistently over 130/80 Continue DASH diet.   Reminder to go to the ER if any CP, SOB, nausea, dizziness, severe HA, changes vision/speech, left arm numbness and tingling and jaw pain.  OAB (overactive bladder) Established with Dr. Louis Meckel, on myrbetriq with ongoing discomfort  ? Bladder prolapse following sling procedure in 2000, having increasing night time awakenings, sensation/discomfort of prolapse Will refer to Dr. Amalia Hailey or other for second operation, patient feels strongly improving this issue is essential for quality of life.  Anxiety Well managed by current regimen; continue medications Stress management techniques discussed, increase water, good sleep hygiene discussed, increase exercise, and increase veggies.   History of pulmonary embolus (PE) Provoked, after humerus fracture; was on coumadin but has been tapered off with no recurrent issues.   Prediabetes Discussed disease and risks of elevated glucose Discussed diet/exercise, weight management  -     Hemoglobin A1c   Overweight Commended her on her excellent progress with 42 lb weight loss since last visit Recommended diet heavy in fruits and veggies and low in animal meats, cheeses, and dairy products, appropriate calorie intake Discussed appropriate weight for height  Patient on phentermine with benefit and no SE, taking drug breaks, down to 1/2 tab daily; continue close follow up. Suggested she start taking more frequent breaks, only as needed for maintenance <165lb Follow up at next visit  Hyperipidemia Very mild elevation currently treated by lifestyle only Continue low cholesterol diet and exercise.  -     Lipid panel -     TSH  Vitamin D deficiency -     VITAMIN D 25 Hydroxy (Vit-D Deficiency,  Fractures)  Medication management -     CBC with Differential/Platelet -     CMP/GFR -     UA -     magnesium  Osteopenia of multiple sites DEXA scheduled for October; continues with fosamax without issues following an 18 month on, 6 month off schedule per recommendation by ortho.   Psoriasis Previously treated by methotrexate but has been tapered off by derm Dr. Jari Pigg; doing well without significant flares.   GERD Symptoms well managed without breakthrough Will try to get off PPI given info for taper and zantac sent in   Discussed med's effects and SE's. Screening labs and tests as requested with regular follow-up as recommended. Over 40 minutes of exam, counseling, chart review, and complex, high level critical decision making was performed this visit.   Future Appointments  Date Time Provider [place]  08/04/2018  1:30 PM [facility] DX DEXA 1 [facility]  08/04/2018  2:00 PM [facility] MM 2 [facility]  12/22/2018 11:15 AM [name], NP [facility] None     HPI  67 y.o. female  presents for a complete physical and follow up for has History of pulmonary embolus (PE); Anxiety; OAB (overactive bladder); Prediabetes; Medication management; Osteopenia of multiple sites; Overweight (BMI 25.0-29.9); Hyperlipidemia; Vitamin D deficiency; and Psoriasis on their problem list. She is followed by Dr. Jari Pigg for psoriasis previously on methotrexate, currently off and doing well.   She has ongoing bladder issues; OAB, frequent UTIs, known bladder prolapse; currently seeing Dr. Louis Meckel but would like a referral to Dr. Amalia Hailey for discussion of alternative treatments/surgical interventions.   she has a diagnosis of GERD which is currently managed by prilosec  40 mg daily she reports symptoms is currently well controlled, and denies breakthrough reflux, burning in chest, hoarseness or cough.    she is prescribed phentermine for weight loss.  While on the  medication they have lost 42 lbs since last visit. They deny palpitations, anxiety, trouble sleeping, elevated BP.   BMI is Body mass index is 26.47 kg/m., she is working on diet and exercise. Wt Readings from Last 3 Encounters:  07/13/18 164 lb (74.4 kg)  12/15/17 206 lb (93.4 kg)  07/28/17 210 lb (95.3 kg)   Her blood pressure has been controlled at home, today their BP is BP: 112/72 She does workout. She denies chest pain, shortness of breath, dizziness.   She is not on cholesterol medication and denies myalgias. Her cholesterol is not at goal. The cholesterol last visit was:   Lab Results  Component Value Date   CHOL 205 (H) 12/15/2017   HDL 64 12/15/2017   LDLCALC 124 (H) 12/15/2017   TRIG 73 12/15/2017   CHOLHDL 3.2 12/15/2017   She has been working on diet and exercise for prediabetes, she is on bASA, she is not on ACE/ARB and denies foot ulcerations, increased appetite, nausea, paresthesia of the feet, polydipsia, polyuria, visual disturbances and vomiting. Last A1C in the office was:  Lab Results  Component Value Date   HGBA1C 5.7 (H) 12/15/2017   Last GFR: Lab Results  Component Value Date   [facility] 94 12/15/2017   Patient is on Vitamin D supplement.   Lab Results  Component Value Date   VD25OH 91 12/15/2017      Current Medications:  Current Outpatient Medications on File Prior to Visit  Medication Sig Dispense Refill  . aspirin EC 81 MG tablet Take 81 mg by mouth daily.    . calcipotriene (DOVONOX) 0.005 % cream Apply 1 application topically at bedtime.    . calcium carbonate (TUMS EX) 750 MG chewable tablet Chew 1 tablet by mouth daily.    . Cholecalciferol (VITAMIN D-3) 5000 UNITS TABS Take 5,000 Units by mouth 2 (two) times daily.    Marland Kitchen CRANBERRY PO Take by mouth. Take 2 caplets BID    . hydrochlorothiazide (HYDRODIURIL) 25 MG tablet Take 1 /2 to 1 tablet daily for BP  & Fluid 90 tablet 3  . Mirabegron (MYRBETRIQ PO) Take 50 mg by mouth daily.    .  Multiple Vitamin (MULTI-VITAMIN DAILY PO) Take 1 tablet by mouth daily.    Marland Kitchen omeprazole (PRILOSEC) 40 MG capsule Take 1 capsule (40 mg total) by mouth daily. 90 capsule 3  . phentermine (ADIPEX-P) 37.5 MG tablet Take 1/2 to 1 tablet every morning for dieting & weightloss 30 tablet 5  . Probiotic Product (PROBIOTIC DAILY PO) Take by mouth. 1 capsule daily    . sertraline (ZOLOFT) 25 MG tablet TAKE ONE TABLET BY MOUTH DAILY 90 tablet 3  . vitamin C (ASCORBIC ACID) 500 MG tablet Take 500 mg by mouth 2 (two) times daily.    Marland Kitchen ampicillin (PRINCIPEN) 500 MG capsule Take 1 capsule (500 mg total) by mouth 4 (four) times daily. 28 capsule 0  . CINNAMON PO Take 1,000 mg by mouth 2 (two) times daily.    . fluconazole (DIFLUCAN) 150 MG tablet Take 1 tablet 2 x /  for yeast infection 8 tablet 1  . FOLIC ACID PO Take 1 tablet by mouth daily.    Marland Kitchen levofloxacin (LEVAQUIN) 500 MG tablet Take 1 tablet daily with food for infection 15 tablet  1  . prednisoLONE acetate (PRED FORTE) 1 % ophthalmic suspension 1 to 2 drops to affected eye 4 x / day 5 mL 0  . predniSONE (DELTASONE) 20 MG tablet 1 tab 3 x day for 3 days, then 1 tab 2 x day for 3 days, then 1 tab 1 x day for 5 days 20 tablet 0   No current facility-administered medications on file prior to visit.    Allergies:  Allergies  Allergen Reactions  . Penicillins Rash  . Latex Rash   Medical History:  She has History of pulmonary embolus (PE); Anxiety; OAB (overactive bladder); Prediabetes; Medication management; Osteopenia of multiple sites; Overweight (BMI 25.0-29.9); Hyperlipidemia; Vitamin D deficiency; and Psoriasis on their problem list. Health Maintenance:   Immunization History  Administered Date(s) Administered  . Hepatitis B 01/11/2013  . Influenza Split 07/25/2012, 08/12/2014, 08/21/2015  . Influenza, High Dose Seasonal PF 08/02/2017  . Influenza,inj,quad, With Preservative 08/13/2016  . PPD Test 07/31/2014, 07/28/2015, 08/13/2016  .  Pneumococcal Conjugate-13 12/15/2017  . Pneumococcal Polysaccharide-23 04/16/2002  . Tdap 12/21/2011  . Zoster 10/26/2003   Preventative care: Tetanus: 2014 Pneumovax: 1996, 2003 Prevnar 13: 2019 Flu vaccine: 2018, will get today Zostavax: 2005   Pap: S/p hysterectomy, last pelvic remote  Last colonoscopy: 2014? No reports, patient called, needs repeat 5 years due this December Mammogram: 06/2017, has scheduled 07/2018 Dexa: 2017 due fall 2019, has scheduled  Names of Other Physician/Practitioners you currently use: 1. [facility] here for primary care 2. Dr. Prudencio Burly, eye doctor, last visit 12/2017 3. Dr. Colin Benton , dentist, last visit 05/2018, goes q62m  Patient Care Team: Unk Pinto, MD as PCP - General (Internal Medicine) Jari Pigg, MD as Consulting Physician (Dermatology) Marybelle Killings, MD as Consulting Physician (Orthopedic Surgery)  Surgical History:  She has a past surgical history that includes Tonsillectomy (1962); Appendectomy (1959); Bladder suspension (11/1998); Ureter revision (Left, 11/3003); Extensor tendon of forearm / wrist repair (Left, 2010); ORIF humerus fracture (04/07/2012); Steriod injection (04/07/2012); Breast cyst aspiration (Right, 2011); and vaginal hysterectomy with unilateral salpingooopherectomy (2000). Family History:  Herfamily history includes Colon cancer in her maternal uncle; Colon cancer (age of onset: 77) in her father; Heart disease in her maternal grandfather and mother; Heart failure (age of onset: 34) in her maternal grandfather. Social History:  She reports that she has never smoked. She has never used smokeless tobacco. She reports that she does not drink alcohol or use drugs.  Review of Systems: Review of Systems  Constitutional: Negative for malaise/fatigue and weight loss.  HENT: Negative for hearing loss and tinnitus.   Eyes: Negative for blurred vision and double vision.  Respiratory:  Negative for cough, shortness of breath and wheezing.   Cardiovascular: Negative for chest pain, palpitations, orthopnea, claudication and leg swelling.  Gastrointestinal: Negative for abdominal pain, blood in stool, constipation, diarrhea, heartburn, melena, nausea and vomiting.  Genitourinary: Positive for frequency. Negative for dysuria, flank pain and hematuria.       Sensation of pelvic pressure, urinary frequency, hx of bladder prolapse, frequent urination at night  Musculoskeletal: Negative for joint pain and myalgias.  Skin: Negative for rash.  Neurological: Negative for dizziness, tingling, sensory change, weakness and headaches.  Endo/Heme/Allergies: Negative for polydipsia.  Psychiatric/Behavioral: Negative.   All other systems reviewed and are negative.   Physical Exam: Estimated body mass index is 26.47 kg/m as calculated from the following:   Height as of this encounter: 5\' 6"  (1.676 m).  Weight as of this encounter: 164 lb (74.4 kg). BP 112/72   Pulse 82   Temp (!) 97.2 F (36.2 C)   Ht 5\' 6"  (1.676 m)   Wt 164 lb (74.4 kg)   SpO2 95%   BMI 26.47 kg/m  General Appearance: Well nourished, in no apparent distress.  Eyes: PERRLA, EOMs, conjunctiva no swelling or erythema, normal fundi and vessels.  Sinuses: No Frontal/maxillary tenderness  ENT/Mouth: Ext aud canals clear, normal light reflex with TMs without erythema, bulging. Good dentition. No erythema, swelling, or exudate on post pharynx. Tonsils not swollen or erythematous. Hearing normal.  Neck: Supple, thyroid normal. No bruits  Respiratory: Respiratory effort normal, BS equal bilaterally without rales, rhonchi, wheezing or stridor.  Cardio: RRR without murmurs, rubs or gallops. Brisk peripheral pulses without edema.  Chest: symmetric, with normal excursions and percussion.  Breasts: Patient declines Abdomen: Soft, nontender, no guarding, rebound, hernias, masses, or organomegaly.  Lymphatics: Non tender  without lymphadenopathy.  Genitourinary: Deferred Musculoskeletal: Full ROM all peripheral extremities,5/5 strength, and normal gait.  Skin: Warm, dry without rashes, lesions, ecchymosis. Neuro: Cranial nerves intact, reflexes equal bilaterally. Normal muscle tone, no cerebellar symptoms. Sensation intact.  Psych: Awake and oriented X 3, normal affect, Insight and Judgment appropriate.   EKG: defer   Vanessa French 10:56 AM [facility]

## 2018-07-13 ENCOUNTER — Encounter: Payer: Self-pay | Admitting: Adult Health

## 2018-07-13 ENCOUNTER — Ambulatory Visit (INDEPENDENT_AMBULATORY_CARE_PROVIDER_SITE_OTHER): Payer: Medicare HMO | Admitting: Adult Health

## 2018-07-13 VITALS — BP 112/72 | HR 82 | Temp 97.2°F | Ht 66.0 in | Wt 164.0 lb

## 2018-07-13 DIAGNOSIS — Z79899 Other long term (current) drug therapy: Secondary | ICD-10-CM

## 2018-07-13 DIAGNOSIS — R7303 Prediabetes: Secondary | ICD-10-CM

## 2018-07-13 DIAGNOSIS — M8589 Other specified disorders of bone density and structure, multiple sites: Secondary | ICD-10-CM

## 2018-07-13 DIAGNOSIS — Z86711 Personal history of pulmonary embolism: Secondary | ICD-10-CM

## 2018-07-13 DIAGNOSIS — Z111 Encounter for screening for respiratory tuberculosis: Secondary | ICD-10-CM | POA: Diagnosis not present

## 2018-07-13 DIAGNOSIS — N390 Urinary tract infection, site not specified: Secondary | ICD-10-CM

## 2018-07-13 DIAGNOSIS — E785 Hyperlipidemia, unspecified: Secondary | ICD-10-CM

## 2018-07-13 DIAGNOSIS — Z23 Encounter for immunization: Secondary | ICD-10-CM | POA: Diagnosis not present

## 2018-07-13 DIAGNOSIS — N8110 Cystocele, unspecified: Secondary | ICD-10-CM

## 2018-07-13 DIAGNOSIS — F419 Anxiety disorder, unspecified: Secondary | ICD-10-CM

## 2018-07-13 DIAGNOSIS — Z0000 Encounter for general adult medical examination without abnormal findings: Secondary | ICD-10-CM | POA: Diagnosis not present

## 2018-07-13 DIAGNOSIS — E559 Vitamin D deficiency, unspecified: Secondary | ICD-10-CM | POA: Diagnosis not present

## 2018-07-13 DIAGNOSIS — E663 Overweight: Secondary | ICD-10-CM

## 2018-07-13 DIAGNOSIS — K219 Gastro-esophageal reflux disease without esophagitis: Secondary | ICD-10-CM | POA: Insufficient documentation

## 2018-07-13 DIAGNOSIS — N3281 Overactive bladder: Secondary | ICD-10-CM

## 2018-07-13 DIAGNOSIS — L409 Psoriasis, unspecified: Secondary | ICD-10-CM

## 2018-07-13 MED ORDER — RANITIDINE HCL 150 MG PO TABS: 150.0000 mg | ORAL_TABLET | Freq: Two times a day (BID) | ORAL | 1 refills | Status: DC

## 2018-07-13 NOTE — Patient Instructions (Addendum)
Vanessa French , Thank you for taking time to come for your Medicare Wellness Visit. I appreciate your ongoing commitment to your health goals. Please review the following plan we discussed and let me know if I can assist you in the future.   These are the goals we discussed: Goals    . Blood Pressure < 130/80    . LDL CALC < 100    . Weight (lb) < 165 lb (74.8 kg)       This is a list of the screening recommended for you and due dates:  Health Maintenance  Topic Date Due  .  Hepatitis C: One time screening is recommended by Center for Disease Control  (CDC) for  adults born from [birth year] through 1964.   12/15/2018*  . Pneumonia vaccines (2 of 2 - PPSV23) 12/15/2018  . Mammogram  07/02/2019  . Tetanus Vaccine  08/05/2023  . Colon Cancer Screening  09/25/2023  . Flu Shot  Completed  . DEXA scan (bone density measurement)  Completed  *Topic was postponed. The date shown is not the original due date.     Know what a healthy weight is for you (roughly BMI <25) and aim to maintain this  Aim for 7+ servings of fruits and vegetables daily  65-80+ fluid ounces of water or unsweet tea for healthy kidneys  Limit to max 1 drink of alcohol per day; avoid smoking/tobacco  Limit animal fats in diet for cholesterol and heart health - choose grass fed whenever available  Avoid highly processed foods, and foods high in saturated/trans fats  Aim for low stress - take time to unwind and care for your mental health  Aim for 150 min of moderate intensity exercise weekly for heart health, and weights twice weekly for bone health  Aim for 7-9 hours of sleep daily     Pelvic Organ Prolapse Pelvic organ prolapse is the stretching, bulging, or dropping of pelvic organs into an abnormal position. It happens when the muscles and tissues that surround and support pelvic structures are stretched or weak. Pelvic organ prolapse can involve:  Vagina (vaginal prolapse).  Uterus (uterine  prolapse).  Bladder (cystocele).  Rectum (rectocele).  Intestines (enterocele).  When organs other than the vagina are involved, they often bulge into the vagina or protrude from the vagina, depending on how severe the prolapse is. What are the causes? Causes of this condition include:  Pregnancy, labor, and childbirth.  Long-lasting (chronic) cough.  Chronic constipation.  Obesity.  Past pelvic surgery.  Aging. During and after menopause, a decreased production of the hormone estrogen can weaken pelvic ligaments and muscles.  Consistently lifting more than 50 lb (23 kg).  Buildup of fluid in the abdomen due to certain diseases and other conditions.  What are the signs or symptoms? Symptoms of this condition include:  Loss of bladder control when you cough, sneeze, strain, and exercise (stress incontinence). This may be worse immediately following childbirth, and it may gradually improve over time.  Feeling pressure in your pelvis or vagina. This pressure may increase when you cough or when you are having a bowel movement.  A bulge that protrudes from the opening of your vagina or against your vaginal wall. If your uterus protrudes through the opening of your vagina and rubs against your clothing, you may also experience soreness, ulcers, infection, pain, and bleeding.  Increased effort to have a bowel movement or urinate.  Pain in your low back.  Pain, discomfort, or disinterest in  sexual intercourse.  Repeated bladder infections (urinary tract infections).  Difficulty inserting or inability to insert a tampon or applicator.  In some people, this condition does not cause any symptoms. How is this diagnosed? Your health care provider may perform an internal and external vaginal and rectal exam. During the exam, you may be asked to cough and strain while you are lying down, sitting, and standing up. Your health care provider will determine if other tests are required,  such as bladder function tests. How is this treated? In most cases, this condition needs to be treated only if it produces symptoms. No treatment is guaranteed to correct the prolapse or relieve the symptoms completely. Treatment may include:  Lifestyle changes, such as: ? Avoiding drinking beverages that contain caffeine. ? Increasing your intake of high-fiber foods. This can help to decrease constipation and straining during bowel movements. ? Emptying your bladder at scheduled times (bladder training therapy). This can help to reduce or avoid urinary incontinence. ? Losing weight if you are overweight or obese.  Estrogen. Estrogen may help mild prolapse by increasing the strength and tone of pelvic floor muscles.  Kegel exercises. These may help mild cases of prolapse by strengthening and tightening the muscles of the pelvic floor.  Pessary insertion. A pessary is a soft, flexible device that is placed into your vagina by your health care provider to help support the vaginal walls and keep pelvic organs in place.  Surgery. This is often the only form of treatment for severe prolapse. Different types of surgeries are available.  Follow these instructions at home:  Wear a sanitary pad or absorbent product if you have urinary incontinence.  Avoid heavy lifting and straining with exercise and work. Do not hold your breath when you perform mild to moderate lifting and exercise activities. Limit your activities as directed by your health care provider.  Take medicines only as directed by your health care provider.  Perform Kegel exercises as directed by your health care provider.  If you have a pessary, take care of it as directed by your health care provider. Contact a health care provider if:  Your symptoms interfere with your daily activities or sex life.  You need medicine to help with the discomfort.  You notice bleeding from the vagina that is not related to your period.  You  have a fever.  You have pain or bleeding when you urinate.  You have bleeding when you have a bowel movement.  You lose urine when you have sex.  You have chronic constipation.  You have a pessary that falls out.  You have vaginal discharge that has a bad smell.  You have low abdominal pain or cramping that is unusual for you. This information is not intended to replace advice given to you by your health care provider. Make sure you discuss any questions you have with your health care provider. Document Released: 05/08/2014 Document Revised: 03/18/2016 Document Reviewed: 12/24/2013 Elsevier Interactive Patient Education  [employer].

## 2018-07-13 NOTE — Addendum Note (Signed)
Addended by: Chancy Hurter on: 07/13/2018 11:57 AM   Modules accepted: Orders

## 2018-07-14 LAB — URINALYSIS, ROUTINE W REFLEX MICROSCOPIC
BILIRUBIN URINE: NEGATIVE
Glucose, UA: NEGATIVE
KETONES UR: NEGATIVE
Nitrite: NEGATIVE
PH: 8 (ref 5.0–8.0)
Protein, ur: NEGATIVE
RBC / HPF: NONE SEEN /HPF (ref 0–2)
Specific Gravity, Urine: 1.012 (ref 1.001–1.03)

## 2018-07-14 LAB — CBC WITH DIFFERENTIAL/PLATELET
Basophils Absolute: 39 cells/uL (ref 0–200)
Basophils Relative: 0.7 %
EOS PCT: 1.3 %
Eosinophils Absolute: 72 cells/uL (ref 15–500)
HEMATOCRIT: 46 % — AB (ref 35.0–45.0)
HEMOGLOBIN: 15.6 g/dL — AB (ref 11.7–15.5)
LYMPHS ABS: 1661 {cells}/uL (ref 850–3900)
MCH: 30.3 pg (ref 27.0–33.0)
MCHC: 33.9 g/dL (ref 32.0–36.0)
MCV: 89.3 fL (ref 80.0–100.0)
MPV: 10.2 fL (ref 7.5–12.5)
Monocytes Relative: 9.5 %
Neutro Abs: 3207 cells/uL (ref 1500–7800)
Neutrophils Relative %: 58.3 %
Platelets: 238 10*3/uL (ref 140–400)
RBC: 5.15 10*6/uL — AB (ref 3.80–5.10)
RDW: 12.3 % (ref 11.0–15.0)
Total Lymphocyte: 30.2 %
WBC mixed population: 523 cells/uL (ref 200–950)
WBC: 5.5 10*3/uL (ref 3.8–10.8)

## 2018-07-14 LAB — COMPLETE METABOLIC PANEL WITH GFR
AG Ratio: 1.3 (calc) (ref 1.0–2.5)
ALBUMIN MSPROF: 4.2 g/dL (ref 3.6–5.1)
ALT: 10 U/L (ref 6–29)
AST: 14 U/L (ref 10–35)
Alkaline phosphatase (APISO): 57 U/L (ref 33–130)
BUN: 9 mg/dL (ref 7–25)
CALCIUM: 9.6 mg/dL (ref 8.6–10.4)
CO2: 30 mmol/L (ref 20–32)
CREATININE: 0.72 mg/dL (ref 0.50–0.99)
Chloride: 96 mmol/L — ABNORMAL LOW (ref 98–110)
GFR, EST NON AFRICAN AMERICAN: 87 mL/min/{1.73_m2} (ref >=60)
GFR, Est African American: 101 mL/min/{1.73_m2} (ref >=60)
Globulin: 3.3 g/dL (calc) (ref 1.9–3.7)
Glucose, Bld: 110 mg/dL — ABNORMAL HIGH (ref 65–99)
Potassium: 3.5 mmol/L (ref 3.5–5.3)
SODIUM: 136 mmol/L (ref 135–146)
Total Bilirubin: 0.7 mg/dL (ref 0.2–1.2)
Total Protein: 7.5 g/dL (ref 6.1–8.1)

## 2018-07-14 LAB — LIPID PANEL
CHOL/HDL RATIO: 3.2 (calc) (ref <5.0)
CHOLESTEROL: 193 mg/dL (ref <200)
HDL: 61 mg/dL (ref >50)
LDL CHOLESTEROL (CALC): 116 mg/dL — AB
Non-HDL Cholesterol (Calc): 132 mg/dL (calc) — ABNORMAL HIGH (ref <130)
TRIGLYCERIDES: 64 mg/dL (ref <150)

## 2018-07-14 LAB — HEMOGLOBIN A1C
Hgb A1c MFr Bld: 5.6 % of total Hgb (ref <5.7)
Mean Plasma Glucose: 114 (calc)
eAG (mmol/L): 6.3 (calc)

## 2018-07-14 LAB — MAGNESIUM: Magnesium: 2 mg/dL (ref 1.5–2.5)

## 2018-07-14 LAB — MICROALBUMIN / CREATININE URINE RATIO
Creatinine, Urine: 41 mg/dL (ref 20–275)
MICROALB UR: 1.4 mg/dL
Microalb Creat Ratio: 34 mcg/mg creat — ABNORMAL HIGH (ref <30)

## 2018-07-14 LAB — TSH: TSH: 1.18 mIU/L (ref 0.40–4.50)

## 2018-07-17 LAB — TB SKIN TEST
Induration: 0 mm
TB Skin Test: NEGATIVE

## 2018-07-24 ENCOUNTER — Encounter: Payer: Self-pay | Admitting: Internal Medicine

## 2018-07-28 ENCOUNTER — Encounter: Payer: Self-pay | Admitting: Adult Health

## 2018-08-04 ENCOUNTER — Other Ambulatory Visit: Payer: Medicare HMO

## 2018-08-04 ENCOUNTER — Ambulatory Visit: Payer: Medicare HMO

## 2018-08-09 ENCOUNTER — Ambulatory Visit
Admission: RE | Admit: 2018-08-09 | Discharge: 2018-08-09 | Disposition: A | Discharge status: Home / Self Care | Payer: Medicare HMO | Source: Ambulatory Visit | Attending: Adult Health | Admitting: Adult Health

## 2018-08-09 DIAGNOSIS — Z1231 Encounter for screening mammogram for malignant neoplasm of breast: Secondary | ICD-10-CM

## 2018-08-23 ENCOUNTER — Telehealth: Payer: Self-pay

## 2018-08-23 MED ORDER — SERTRALINE HCL 25 MG PO TABS: 25.0000 mg | ORAL_TABLET | Freq: Every day | ORAL | 3 refills | Status: DC

## 2018-08-23 NOTE — Telephone Encounter (Signed)
Refill request for Sertraline

## 2018-08-29 DIAGNOSIS — N8111 Cystocele, midline: Secondary | ICD-10-CM | POA: Diagnosis not present

## 2018-08-29 DIAGNOSIS — R8290 Unspecified abnormal findings in urine: Secondary | ICD-10-CM | POA: Diagnosis not present

## 2018-08-29 DIAGNOSIS — S3710XA Unspecified injury of ureter, initial encounter: Secondary | ICD-10-CM | POA: Insufficient documentation

## 2018-08-29 DIAGNOSIS — N819 Female genital prolapse, unspecified: Secondary | ICD-10-CM | POA: Insufficient documentation

## 2018-08-29 DIAGNOSIS — S3710XD Unspecified injury of ureter, subsequent encounter: Secondary | ICD-10-CM | POA: Diagnosis not present

## 2018-08-29 DIAGNOSIS — N3281 Overactive bladder: Secondary | ICD-10-CM | POA: Diagnosis not present

## 2018-08-29 HISTORY — DX: Unspecified injury of ureter, initial encounter: S37.10XA

## 2018-08-30 DIAGNOSIS — R339 Retention of urine, unspecified: Secondary | ICD-10-CM | POA: Diagnosis not present

## 2018-08-30 DIAGNOSIS — N3281 Overactive bladder: Secondary | ICD-10-CM | POA: Diagnosis not present

## 2018-09-01 DIAGNOSIS — N200 Calculus of kidney: Secondary | ICD-10-CM | POA: Diagnosis not present

## 2018-09-01 DIAGNOSIS — N3289 Other specified disorders of bladder: Secondary | ICD-10-CM | POA: Diagnosis not present

## 2018-09-05 DIAGNOSIS — N8110 Cystocele, unspecified: Secondary | ICD-10-CM | POA: Insufficient documentation

## 2018-09-12 ENCOUNTER — Other Ambulatory Visit: Payer: Self-pay | Admitting: Adult Health

## 2018-09-12 DIAGNOSIS — E669 Obesity, unspecified: Secondary | ICD-10-CM

## 2018-09-12 MED ORDER — PHENTERMINE HCL 37.5 MG PO TABS: ORAL_TABLET | ORAL | 5 refills | Status: DC

## 2018-09-19 DIAGNOSIS — N8111 Cystocele, midline: Secondary | ICD-10-CM | POA: Diagnosis not present

## 2018-09-19 DIAGNOSIS — N3281 Overactive bladder: Secondary | ICD-10-CM | POA: Diagnosis not present

## 2018-09-19 DIAGNOSIS — S3710XD Unspecified injury of ureter, subsequent encounter: Secondary | ICD-10-CM | POA: Diagnosis not present

## 2018-09-27 DIAGNOSIS — D2271 Melanocytic nevi of right lower limb, including hip: Secondary | ICD-10-CM | POA: Diagnosis not present

## 2018-09-27 DIAGNOSIS — L821 Other seborrheic keratosis: Secondary | ICD-10-CM | POA: Diagnosis not present

## 2018-09-27 DIAGNOSIS — D2372 Other benign neoplasm of skin of left lower limb, including hip: Secondary | ICD-10-CM | POA: Diagnosis not present

## 2018-09-27 DIAGNOSIS — L409 Psoriasis, unspecified: Secondary | ICD-10-CM | POA: Diagnosis not present

## 2018-09-27 DIAGNOSIS — Z808 Family history of malignant neoplasm of other organs or systems: Secondary | ICD-10-CM | POA: Diagnosis not present

## 2018-09-27 DIAGNOSIS — Z23 Encounter for immunization: Secondary | ICD-10-CM | POA: Diagnosis not present

## 2018-10-03 ENCOUNTER — Ambulatory Visit
Admission: RE | Admit: 2018-10-03 | Discharge: 2018-10-03 | Disposition: A | Discharge status: Home / Self Care | Payer: Medicare HMO | Source: Ambulatory Visit | Attending: Adult Health | Admitting: Adult Health

## 2018-10-03 DIAGNOSIS — Z780 Asymptomatic menopausal state: Secondary | ICD-10-CM | POA: Diagnosis not present

## 2018-10-03 DIAGNOSIS — E2839 Other primary ovarian failure: Secondary | ICD-10-CM

## 2018-10-03 DIAGNOSIS — M8580 Other specified disorders of bone density and structure, unspecified site: Secondary | ICD-10-CM

## 2018-10-03 DIAGNOSIS — M8589 Other specified disorders of bone density and structure, multiple sites: Secondary | ICD-10-CM | POA: Diagnosis not present

## 2018-11-10 DIAGNOSIS — N3941 Urge incontinence: Secondary | ICD-10-CM | POA: Diagnosis not present

## 2018-11-10 DIAGNOSIS — R339 Retention of urine, unspecified: Secondary | ICD-10-CM | POA: Diagnosis not present

## 2018-11-10 DIAGNOSIS — N8111 Cystocele, midline: Secondary | ICD-10-CM | POA: Diagnosis not present

## 2018-11-10 HISTORY — PX: CYSTOCELE REPAIR: SHX163

## 2018-11-24 ENCOUNTER — Other Ambulatory Visit: Payer: Self-pay | Admitting: Internal Medicine

## 2018-11-24 DIAGNOSIS — R609 Edema, unspecified: Secondary | ICD-10-CM

## 2018-12-21 DIAGNOSIS — I10 Essential (primary) hypertension: Secondary | ICD-10-CM | POA: Insufficient documentation

## 2018-12-21 NOTE — Progress Notes (Signed)
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   Reminder to go to the ER if any CP, SOB, nausea, dizziness, severe HA, changes vision/speech, left arm numbness and tingling and jaw pain.  OAB (overactive bladder) S/p surgical correction of cystocele by Dr. Alona Bene and improved, no recent UTIs Off of myrbetriq at this time, may restart if frequency persists  Anxiety Well managed by current regimen; continue medications Stress management techniques discussed, increase water, good sleep hygiene discussed, increase exercise, and increase veggies.   History of pulmonary embolus (PE) Provoked, after humerus fracture; was on coumadin but has been tapered off with no recurrent issues.   Other abnormal  Discussed disease and risks of elevated glucose Discussed diet/exercise, weight management  -     Hemoglobin A1c   BMI 26 Long discussion about weight loss, diet, and exercise Recommended diet heavy in fruits and veggies and low in animal meats, cheeses, and dairy products, appropriate calorie intake Discussed appropriate weight for height  Patient will work on restarting exercise, resume walking for 10000 steps daily Excellent progress with phentermine, now off and doing well  Follow up at next visit  Hyperipidemia Very mild elevation currently treated by lifestyle only Continue low cholesterol diet and exercise.  -     Lipid panel -     TSH  Vitamin D deficiency -     VITAMIN D 25 Hydroxy (Vit-D Deficiency, Fractures)  Medication management -     CBC with Differential/Platelet -     CMP/GFR  Osteopenia of multiple sites Due for repeat Dexa 2021; has been on fosamax without issues  following an 18 month on, 6 month off schedule per recommendation by ortho. She has been off since 01/2018, will remain off until follow up DEXA.   Psoriasis Previously treated by methotrexate but has been tapered off by derm; doing well without significant flares. Doing topical dovonox and triamcinolone and remains well controlled.   Vaginal odor Recent surgery; will check wet prep, r/o BV, yeast  Urinary frequency Check UA  Over 40 minutes of exam, counseling, chart review and critical decision making was performed  No future appointments.    Plan:   During the course of the visit the patient was educated and counseled about appropriate screening and preventive services including:    Pneumococcal vaccine   Prevnar 13  Influenza vaccine  Td vaccine  Screening electrocardiogram  Bone densitometry screening  Colorectal cancer screening  Diabetes screening  Glaucoma screening  Nutrition counseling   Advanced directives: requested   Subjective:  Vanessa French is a 68 y.o. female who presents for Medicare Annual Wellness Visit and 3 month follow up.   She has hx of osteoporosis, currently off of fosamax since 01/2018 (formerly following 18 month on, 6 month off schedule); taking intermittently since ?2013 since humerus fracture; most recent DEXA in 09/2018 demonstrated L fem T -1.9.   Has hx of vaginal hysterectomy and had  bladder sling in 2000; had frequent UTIs in the past with cystocele and was referred to Dr. Alona Bene with [facility] and underwent anterior cystocele repair per his recommendation. She has been off of myrbetriq while recovering from surgery. She is  also doing topical premarin, backed off to three days a week. She reports noting odd vaginal odor, no atypical discharge, no dysuria, frequency. Does continue to to have urgency. Wears a pad (2 during the day while at work).   she has a diagnosis of GERD which is currently managed by omeprazole 40 mg  she  reports symptoms is currently well controlled, and denies breakthrough reflux, burning in chest, hoarseness or cough.    BMI is Body mass index is 26.21 kg/m., she has been working on diet and exercise, has successfully lost lb from 214lb to 162lb on phentermine but is since off of the medication and remains doing well.  She reports her husband was diagnosed with Parkinson's last year and has fallen out of habit with daily walking. Plans to restart. For diet, focusing on fruits and vegetables, meat one a day if at all, very rare red meat, eats nuts and occasionally beans. Drinks 4 bottles of water daily.  Wt Readings from Last 3 Encounters:  12/22/18 162 lb 6.4 oz (73.7 kg)  07/13/18 164 lb (74.4 kg)  12/15/17 206 lb (93.4 kg)    Her blood pressure has been controlled at home, today their BP is BP: 106/68 She does not workout. She denies chest pain, shortness of breath, dizziness.   She is not on cholesterol medication and denies myalgias. Her cholesterol is not at goal. The cholesterol last visit was:   Lab Results  Component Value Date   CHOL 193 07/13/2018   HDL 61 07/13/2018   LDLCALC 116 (H) 07/13/2018   TRIG 64 07/13/2018   CHOLHDL 3.2 07/13/2018    She has not been working on diet and exercise for prediabetes, most recently with A1C normalized, and denies increased appetite, nausea, paresthesia of the feet, polydipsia, polyuria, visual disturbances, vomiting and weight loss. Last A1C in the office was:  Lab Results  Component Value Date   HGBA1C 5.6 07/13/2018   Last GFR: Lab Results  Component Value Date   GFRNONAA 87 07/13/2018   Patient is on Vitamin D supplement and was at goal at the last check:   Lab Results  Component Value Date   VD25OH 91 12/15/2017      Medication Review: Current Outpatient Medications on File Prior to Visit  Medication Sig Dispense Refill  . aspirin EC 81 MG tablet Take 81 mg by mouth daily.    . calcipotriene (DOVONOX) 0.005 % cream Apply  1 application topically at bedtime.    . calcium carbonate (TUMS EX) 750 MG chewable tablet Chew 1 tablet by mouth daily.    . Cholecalciferol (VITAMIN D-3) 5000 UNITS TABS Take 5,000 Units by mouth 2 (two) times daily.    Marland Kitchen CRANBERRY PO Take by mouth. Take 2 caplets BID    . hydrochlorothiazide (HYDRODIURIL) 25 MG tablet TAKE 1/2 TO 1 TABLET BY MOUTH DAILY FOR BLOOD PRESSURE AND FLUID 90 tablet 2  . Multiple Vitamin (MULTI-VITAMIN DAILY PO) Take 1 tablet by mouth daily.    Marland Kitchen omeprazole (PRILOSEC) 40 MG capsule Take 1 capsule (40 mg total) by mouth daily. 90 capsule 3  . Probiotic Product (PROBIOTIC DAILY PO) Take by mouth. 1 capsule daily    . sertraline (ZOLOFT) 25 MG tablet Take 1 tablet (25 mg total) by mouth daily. 90 tablet 3  . Mirabegron (MYRBETRIQ PO) Take 50 mg by mouth daily.     No current facility-administered medications on file prior to visit.     Allergies  Allergen Reactions  .  Penicillins Rash  . Latex Rash    Current Problems (verified) Patient Active Problem List   Diagnosis Date Noted  . Hypertension 12/21/2018  . GERD (gastroesophageal reflux disease) 07/13/2018  . Psoriasis 12/15/2017  . Medication management 12/14/2017  . Osteopenia of multiple sites 12/14/2017  . Overweight (BMI 25.0-29.9) 12/14/2017  . Hyperlipidemia 12/14/2017  . Vitamin D deficiency 12/14/2017  . OAB (overactive bladder) 11/30/2016  . Prediabetes 11/30/2016  . History of pulmonary embolus (PE) 11/03/2012  . Anxiety 11/03/2012    Screening Tests Immunization History  Administered Date(s) Administered  . Hepatitis B 01/11/2013  . Influenza Split 07/25/2012, 08/12/2014, 08/21/2015  . Influenza, High Dose Seasonal PF 08/02/2017, 07/13/2018  . Influenza,inj,quad, With Preservative 08/13/2016  . PPD Test 07/31/2014, 07/28/2015, 08/13/2016, 07/13/2018  . Pneumococcal Conjugate-13 12/15/2017  . Pneumococcal Polysaccharide-23 04/16/2002  . Tdap 12/21/2011  . Zoster 10/26/2003    Preventative care: Tetanus: 2014 Pneumovax: 1996, 2003 Prevnar 13: 2019 Flu vaccine: 2019 Zostavax: 2005   Pap: S/p hysterectomy, last pelvic remote  Last colonoscopy: 2014? No reports, patient called, needs repeat 5 years due 09/2018 - will schedule this year - postponed due to recent surgery  Mammogram: 07/2018 Dexa: 09/2018 L fem T -1.9 ECHO: 2014  Names of Other Physician/Practitioners you currently use: 1. [facility] here for primary care 2. Dr. Prudencio Burly, eye doctor, last visit 12/2017, has scheduled in March  3. Dr. Colin Benton, dentist, last visit 11/2018, goes q51m 3. Dr. Delman Cheadle, derm, goes q8m, psoriasis  Patient Care Team: Unk Pinto, MD as PCP - General (Internal Medicine) Jari Pigg, MD as Consulting Physician (Dermatology) Marybelle Killings, MD as Consulting Physician (Orthopedic Surgery)  SURGICAL HISTORY She  has a past surgical history that includes Tonsillectomy (1962); Appendectomy (1959); Bladder suspension (11/1998); Ureter revision (Left, 11/3003); Extensor tendon of forearm / wrist repair (Left, 2010); ORIF humerus fracture (04/07/2012); Steriod injection (04/07/2012); Breast cyst aspiration (Right, 2011); vaginal hysterectomy with unilateral salpingooopherectomy (2000); Cataract extraction, bilateral (Bilateral, 2016); and Cystocele repair (11/10/2018). FAMILY HISTORY Her family history includes Colon cancer in her maternal uncle; Colon cancer (age of onset: 72) in her father; Heart disease in her maternal grandfather and mother; Heart failure (age of onset: 5) in her maternal grandfather. SOCIAL HISTORY She  reports that she has never smoked. She has never used smokeless tobacco. She reports that she does not drink alcohol or use drugs.   MEDICARE WELLNESS OBJECTIVES: Physical activity: Current Exercise Habits: The patient does not participate in regular exercise at present, Exercise limited by: None identified Cardiac risk  factors: Cardiac Risk Factors include: advanced age (>65men, >11 women);hypertension;dyslipidemia Depression/mood screen:   Depression screen [facility] 2/9 12/22/2018  Decreased Interest 0  Down, Depressed, Hopeless 0  PHQ - 2 Score 0    ADLs:  In your present state of health, do you have any difficulty performing the following activities: 12/22/2018  Hearing? N  Vision? N  Difficulty concentrating or making decisions? N  Walking or climbing stairs? N  Dressing or bathing? N  Doing errands, shopping? N  Some recent data might be hidden     Cognitive Testing  Alert? Yes  Normal Appearance?Yes  Oriented to person? Yes  Place? Yes   Time? Yes  Recall of three objects?  Yes  Can perform simple calculations? Yes  Displays appropriate judgment?Yes  Can read the correct time from a watch face?Yes  EOL planning: Does Patient Have a Medical Advance Directive?: Yes Type of Advance Directive: Healthcare  Power of Attorney, Living will Does patient want to make changes to medical advance directive?: No - Patient declined Copy of [place] in Chart?: No - copy requested  Review of Systems  Constitutional: Negative for chills, fever, malaise/fatigue and weight loss.  HENT: Negative for hearing loss and tinnitus.   Eyes: Negative for blurred vision and double vision.  Respiratory: Negative for cough, shortness of breath and wheezing.   Cardiovascular: Negative for chest pain, palpitations, orthopnea, claudication and leg swelling.  Gastrointestinal: Negative for abdominal pain, blood in stool, constipation, diarrhea, heartburn, melena, nausea and vomiting.  Genitourinary: Positive for frequency and urgency. Negative for dysuria, flank pain and hematuria.  Musculoskeletal: Negative for falls, joint pain and myalgias.  Skin: Negative for rash.  Neurological: Negative for dizziness, tingling, sensory change, weakness and headaches.  Endo/Heme/Allergies: Negative for polydipsia.   Psychiatric/Behavioral: Negative.  Negative for depression, memory loss and substance abuse. The patient is not nervous/anxious and does not have insomnia.   All other systems reviewed and are negative.    Objective:     Today's Vitals   12/22/18 0935  BP: 106/68  Pulse: 80  Temp: 97.9 F (36.6 C)  SpO2: 97%  Weight: 162 lb 6.4 oz (73.7 kg)  Height: 5\' 6"  (1.676 m)   Body mass index is 26.21 kg/m.  General appearance: alert, no distress, WD/WN, female HEENT: normocephalic, sclerae anicteric, TMs pearly, nares patent, no discharge or erythema, pharynx normal Oral cavity: MMM, no lesions Neck: supple, no lymphadenopathy, no thyromegaly, no masses Heart: RRR, normal S1, S2, no murmurs Lungs: CTA bilaterally, no wheezes, rhonchi, or rales Abdomen: +bs, soft, non tender, non distended, no masses, no hepatomegaly, no splenomegaly Musculoskeletal: nontender, no swelling, no obvious deformity Extremities: no edema, no cyanosis, no clubbing Pulses: 2+ symmetric, upper and lower extremities, normal cap refill Neurological: alert, oriented x 3, CN2-12 intact, strength normal upper extremities and lower extremities, sensation normal throughout, DTRs 2+ throughout, no cerebellar signs, gait normal Psychiatric: normal affect, behavior normal, pleasant   Medicare Attestation I have personally reviewed: The patient's medical and social history Their use of alcohol, tobacco or illicit drugs Their current medications and supplements The patient's functional ability including ADLs,fall risks, home safety risks, cognitive, and hearing and visual impairment Diet and physical activities Evidence for depression or mood disorders  The patient's weight, height, BMI, and visual acuity have been recorded in the chart.  I have made referrals, counseling, and provided education to the patient based on review of the above and I have provided the patient with a written personalized care plan for  preventive services.     Izora Ribas, NP   12/22/2018

## 2018-12-22 ENCOUNTER — Ambulatory Visit (INDEPENDENT_AMBULATORY_CARE_PROVIDER_SITE_OTHER): Payer: Medicare HMO | Admitting: Adult Health

## 2018-12-22 ENCOUNTER — Encounter: Payer: Self-pay | Admitting: Adult Health

## 2018-12-22 VITALS — BP 106/68 | HR 80 | Temp 97.9°F | Ht 66.0 in | Wt 162.4 lb

## 2018-12-22 DIAGNOSIS — E663 Overweight: Secondary | ICD-10-CM | POA: Diagnosis not present

## 2018-12-22 DIAGNOSIS — R350 Frequency of micturition: Secondary | ICD-10-CM | POA: Diagnosis not present

## 2018-12-22 DIAGNOSIS — R69 Illness, unspecified: Secondary | ICD-10-CM | POA: Diagnosis not present

## 2018-12-22 DIAGNOSIS — Z79899 Other long term (current) drug therapy: Secondary | ICD-10-CM | POA: Diagnosis not present

## 2018-12-22 DIAGNOSIS — M8589 Other specified disorders of bone density and structure, multiple sites: Secondary | ICD-10-CM

## 2018-12-22 DIAGNOSIS — Z86711 Personal history of pulmonary embolism: Secondary | ICD-10-CM | POA: Diagnosis not present

## 2018-12-22 DIAGNOSIS — R7303 Prediabetes: Secondary | ICD-10-CM | POA: Diagnosis not present

## 2018-12-22 DIAGNOSIS — F419 Anxiety disorder, unspecified: Secondary | ICD-10-CM

## 2018-12-22 DIAGNOSIS — L409 Psoriasis, unspecified: Secondary | ICD-10-CM

## 2018-12-22 DIAGNOSIS — N898 Other specified noninflammatory disorders of vagina: Secondary | ICD-10-CM

## 2018-12-22 DIAGNOSIS — E559 Vitamin D deficiency, unspecified: Secondary | ICD-10-CM

## 2018-12-22 DIAGNOSIS — I10 Essential (primary) hypertension: Secondary | ICD-10-CM

## 2018-12-22 DIAGNOSIS — R7309 Other abnormal glucose: Secondary | ICD-10-CM | POA: Diagnosis not present

## 2018-12-22 DIAGNOSIS — N3281 Overactive bladder: Secondary | ICD-10-CM

## 2018-12-22 DIAGNOSIS — R6889 Other general symptoms and signs: Secondary | ICD-10-CM | POA: Diagnosis not present

## 2018-12-22 DIAGNOSIS — E785 Hyperlipidemia, unspecified: Secondary | ICD-10-CM

## 2018-12-22 DIAGNOSIS — Z0000 Encounter for general adult medical examination without abnormal findings: Secondary | ICD-10-CM

## 2018-12-22 DIAGNOSIS — Z0001 Encounter for general adult medical examination with abnormal findings: Secondary | ICD-10-CM | POA: Diagnosis not present

## 2018-12-22 DIAGNOSIS — K219 Gastro-esophageal reflux disease without esophagitis: Secondary | ICD-10-CM

## 2018-12-22 MED ORDER — TRIAMCINOLONE ACETONIDE 0.1 % EX OINT: 1.0000 "application " | TOPICAL_OINTMENT | Freq: Two times a day (BID) | CUTANEOUS | 1 refills | Status: DC

## 2018-12-22 NOTE — Patient Instructions (Signed)
  Vanessa French , Thank you for taking time to come for your Medicare Wellness Visit. I appreciate your ongoing commitment to your health goals. Please review the following plan we discussed and let me know if I can assist you in the future.   These are the goals we discussed: Goals    . Blood Pressure < 130/80    . LDL CALC < 100    . Weight (lb) < 165 lb (74.8 kg)       This is a list of the screening recommended for you and due dates:  Health Maintenance  Topic Date Due  .  Hepatitis C: One time screening is recommended by Center for Disease Control  (CDC) for  adults born from [birth year] through 1964.   12/23/2019*  . Mammogram  08/09/2020  . Tetanus Vaccine  08/05/2023  . Colon Cancer Screening  09/25/2023  . Flu Shot  Completed  . DEXA scan (bone density measurement)  Completed  . Pneumonia vaccines  Discontinued  *Topic was postponed. The date shown is not the original due date.

## 2018-12-23 LAB — CBC WITH DIFFERENTIAL/PLATELET
Absolute Monocytes: 482 cells/uL (ref 200–950)
Basophils Absolute: 42 cells/uL (ref 0–200)
Basophils Relative: 0.8 %
Eosinophils Absolute: 80 cells/uL (ref 15–500)
Eosinophils Relative: 1.5 %
HCT: 45.1 % — ABNORMAL HIGH (ref 35.0–45.0)
Hemoglobin: 15.5 g/dL (ref 11.7–15.5)
LYMPHS ABS: 1553 {cells}/uL (ref 850–3900)
MCH: 30.9 pg (ref 27.0–33.0)
MCHC: 34.4 g/dL (ref 32.0–36.0)
MCV: 89.8 fL (ref 80.0–100.0)
MPV: 10 fL (ref 7.5–12.5)
Monocytes Relative: 9.1 %
NEUTROS ABS: 3143 {cells}/uL (ref 1500–7800)
Neutrophils Relative %: 59.3 %
Platelets: 234 10*3/uL (ref 140–400)
RBC: 5.02 10*6/uL (ref 3.80–5.10)
RDW: 12.1 % (ref 11.0–15.0)
Total Lymphocyte: 29.3 %
WBC: 5.3 10*3/uL (ref 3.8–10.8)

## 2018-12-23 LAB — COMPLETE METABOLIC PANEL WITH GFR
AG Ratio: 1.4 (calc) (ref 1.0–2.5)
ALT: 10 U/L (ref 6–29)
AST: 13 U/L (ref 10–35)
Albumin: 4.2 g/dL (ref 3.6–5.1)
Alkaline phosphatase (APISO): 68 U/L (ref 37–153)
BUN: 12 mg/dL (ref 7–25)
CO2: 35 mmol/L — ABNORMAL HIGH (ref 20–32)
CREATININE: 0.52 mg/dL (ref 0.50–0.99)
Calcium: 9.6 mg/dL (ref 8.6–10.4)
Chloride: 101 mmol/L (ref 98–110)
GFR, Est African American: 115 mL/min/{1.73_m2} (ref >=60)
GFR, Est Non African American: 99 mL/min/{1.73_m2} (ref >=60)
Globulin: 3.1 g/dL (calc) (ref 1.9–3.7)
Glucose, Bld: 102 mg/dL — ABNORMAL HIGH (ref 65–99)
Potassium: 3.7 mmol/L (ref 3.5–5.3)
Sodium: 141 mmol/L (ref 135–146)
TOTAL PROTEIN: 7.3 g/dL (ref 6.1–8.1)
Total Bilirubin: 0.7 mg/dL (ref 0.2–1.2)

## 2018-12-23 LAB — LIPID PANEL
Cholesterol: 185 mg/dL (ref <200)
HDL: 67 mg/dL (ref >=50)
LDL Cholesterol (Calc): 104 mg/dL (calc) — ABNORMAL HIGH
Non-HDL Cholesterol (Calc): 118 mg/dL (calc) (ref <130)
Total CHOL/HDL Ratio: 2.8 (calc) (ref <5.0)
Triglycerides: 58 mg/dL (ref <150)

## 2018-12-23 LAB — MAGNESIUM: Magnesium: 2 mg/dL (ref 1.5–2.5)

## 2018-12-23 LAB — TSH: TSH: 1.11 mIU/L (ref 0.40–4.50)

## 2018-12-24 LAB — URINALYSIS W MICROSCOPIC + REFLEX CULTURE
BILIRUBIN URINE: NEGATIVE
Bacteria, UA: NONE SEEN /HPF
Glucose, UA: NEGATIVE
Hgb urine dipstick: NEGATIVE
Hyaline Cast: NONE SEEN /LPF
Ketones, ur: NEGATIVE
Nitrites, Initial: NEGATIVE
Protein, ur: NEGATIVE
Specific Gravity, Urine: 1.009 (ref 1.001–1.03)
Squamous Epithelial / HPF: NONE SEEN /HPF (ref <=5)
pH: 6.5 (ref 5.0–8.0)

## 2018-12-24 LAB — URINE CULTURE
MICRO NUMBER:: 261287
SPECIMEN QUALITY:: ADEQUATE

## 2018-12-24 LAB — WET PREP BY MOLECULAR PROBE
Candida species: NOT DETECTED
Gardnerella vaginalis: NOT DETECTED
MICRO NUMBER:: 256476
SOURCE:: 0
SPECIMEN QUALITY:: ADEQUATE
TRICHOMONAS VAG: NOT DETECTED

## 2018-12-24 LAB — CULTURE INDICATED

## 2019-01-02 ENCOUNTER — Telehealth: Payer: Self-pay

## 2019-01-02 NOTE — Telephone Encounter (Signed)
Entered in error

## 2019-01-17 ENCOUNTER — Encounter: Payer: Self-pay | Admitting: Physician Assistant

## 2019-01-17 ENCOUNTER — Other Ambulatory Visit: Payer: Self-pay

## 2019-02-21 ENCOUNTER — Other Ambulatory Visit: Payer: Self-pay | Admitting: Adult Health

## 2019-02-21 ENCOUNTER — Other Ambulatory Visit: Payer: Self-pay

## 2019-02-21 MED ORDER — TRIAMCINOLONE ACETONIDE 0.1 % EX CREA: 1.0000 "application " | TOPICAL_CREAM | Freq: Two times a day (BID) | CUTANEOUS | 1 refills | Status: AC

## 2019-02-21 MED ORDER — OMEPRAZOLE 40 MG PO CPDR: 40.0000 mg | DELAYED_RELEASE_CAPSULE | Freq: Every day | ORAL | 3 refills | Status: DC

## 2019-02-22 ENCOUNTER — Other Ambulatory Visit: Payer: Self-pay | Admitting: Adult Health

## 2019-02-22 ENCOUNTER — Other Ambulatory Visit: Payer: Self-pay | Admitting: Internal Medicine

## 2019-03-02 DIAGNOSIS — Z961 Presence of intraocular lens: Secondary | ICD-10-CM | POA: Diagnosis not present

## 2019-03-02 DIAGNOSIS — H524 Presbyopia: Secondary | ICD-10-CM | POA: Diagnosis not present

## 2019-03-08 ENCOUNTER — Other Ambulatory Visit: Payer: Medicare HMO

## 2019-03-08 ENCOUNTER — Other Ambulatory Visit: Payer: Self-pay

## 2019-03-08 ENCOUNTER — Other Ambulatory Visit: Payer: Self-pay | Admitting: Internal Medicine

## 2019-03-08 DIAGNOSIS — R319 Hematuria, unspecified: Secondary | ICD-10-CM

## 2019-03-10 ENCOUNTER — Other Ambulatory Visit: Payer: Self-pay | Admitting: Internal Medicine

## 2019-03-10 DIAGNOSIS — N390 Urinary tract infection, site not specified: Secondary | ICD-10-CM

## 2019-03-10 LAB — URINALYSIS, ROUTINE W REFLEX MICROSCOPIC
Bacteria, UA: NONE SEEN /HPF
Bilirubin Urine: NEGATIVE
Glucose, UA: NEGATIVE
Hgb urine dipstick: NEGATIVE
Hyaline Cast: NONE SEEN /LPF
Ketones, ur: NEGATIVE
Nitrite: NEGATIVE
Protein, ur: NEGATIVE
RBC / HPF: NONE SEEN /HPF (ref 0–2)
Specific Gravity, Urine: 1.008 (ref 1.001–1.03)
pH: 7 (ref 5.0–8.0)

## 2019-03-10 LAB — URINE CULTURE
MICRO NUMBER:: 474951
SPECIMEN QUALITY:: ADEQUATE

## 2019-03-10 MED ORDER — NITROFURANTOIN MONOHYD MACRO 100 MG PO CAPS: ORAL_CAPSULE | ORAL | 0 refills | Status: DC

## 2019-03-14 DIAGNOSIS — N8110 Cystocele, unspecified: Secondary | ICD-10-CM | POA: Diagnosis not present

## 2019-03-30 DIAGNOSIS — L409 Psoriasis, unspecified: Secondary | ICD-10-CM | POA: Diagnosis not present

## 2019-03-30 DIAGNOSIS — L710 Perioral dermatitis: Secondary | ICD-10-CM | POA: Diagnosis not present

## 2019-06-08 ENCOUNTER — Other Ambulatory Visit: Payer: Self-pay | Admitting: Internal Medicine

## 2019-06-08 DIAGNOSIS — L03116 Cellulitis of left lower limb: Secondary | ICD-10-CM

## 2019-06-08 MED ORDER — DOXYCYCLINE HYCLATE 100 MG PO CAPS: ORAL_CAPSULE | ORAL | 0 refills | Status: DC

## 2019-06-13 ENCOUNTER — Other Ambulatory Visit: Payer: Self-pay | Admitting: Internal Medicine

## 2019-06-13 MED ORDER — FLUCONAZOLE 150 MG PO TABS: ORAL_TABLET | ORAL | 1 refills | Status: DC

## 2019-06-13 MED ORDER — FLUCONAZOLE 150 MG PO TABS: ORAL_TABLET | ORAL | 3 refills | Status: DC

## 2019-06-29 ENCOUNTER — Encounter: Payer: Self-pay | Admitting: Adult Health

## 2019-07-20 DIAGNOSIS — Z86010 Personal history of colonic polyps: Secondary | ICD-10-CM | POA: Diagnosis not present

## 2019-07-30 ENCOUNTER — Other Ambulatory Visit: Payer: Self-pay | Admitting: Adult Health

## 2019-07-30 DIAGNOSIS — Z1231 Encounter for screening mammogram for malignant neoplasm of breast: Secondary | ICD-10-CM

## 2019-08-01 ENCOUNTER — Encounter: Payer: Self-pay | Admitting: Adult Health

## 2019-08-01 NOTE — Progress Notes (Signed)
Complete Physical  Assessment and Plan:  Encounter for Annual Complete Physical without Abnormal Findings  Hypertension Continue medication: hctz 12.5-25 mg daily Monitor blood pressure at home; call if consistently over 130/80 Continue DASH diet.   Reminder to go to the ER if any CP, SOB, nausea, dizziness, severe HA, changes vision/speech, left arm numbness and tingling and jaw pain.  OAB (overactive bladder) Improved s/p repair of bladder prolapse.  She tapered off of myrbetriq and feels managing well at this time Wears 1-2 liners/day  Anxiety Well managed by current regimen; continue medications Stress management techniques discussed, increase water, good sleep hygiene discussed, increase exercise, and increase veggies.   Hx of prediabetes Discussed disease and risks of elevated glucose Discussed diet/exercise, weight management  -     Hemoglobin A1c   Overweight Commended her on her excellent progress with 50 lb weight loss in last few years Recommended diet heavy in fruits and veggies and low in animal meats, cheeses, and dairy products, appropriate calorie intake Discussed appropriate weight for height  Follow up at next visit  Hyperipidemia Very mild elevation currently treated by lifestyle only Continue low cholesterol diet and exercise.  -     Lipid panel -     TSH  Vitamin D deficiency -     VITAMIN D 25 Hydroxy (Vit-D Deficiency, Fractures)  Medication management -     CBC with Differential/Platelet -     CMP/GFR -     UA -     magnesium  Osteopenia of multiple sites DEXA due 09/2020; continue vit D and calcium supplements continues with fosamax without issues following an 18 month on, 6 month off schedule per recommendation by ortho.   Psoriasis Previously treated by methotrexate but has been tapered off by derm Dr. Jari Pigg; doing well without significant flares with topical agents  GERD Symptoms well managed without breakthrough Will try to  get off PPI given info for taper and famotidine sent in  Discussed med's effects and SE's. Screening labs and tests as requested with regular follow-up as recommended. Over 40 minutes of exam, counseling, chart review, and complex, high level critical decision making was performed this visit.   Future Appointments  Date Time Provider [place]  09/12/2019  7:40 AM [facility] 2 [facility]  12/28/2019 11:15 AM [name], NP [facility] None  08/04/2020 10:00 AM [name], NP [facility] None     HPI  BP 120/70   Pulse 91   Temp (!) 97.5 F (36.4 C)   Ht 5\' 5"  (1.651 m)   Wt 161 lb (73 kg)   SpO2 98%   BMI 26.79 kg/m   68 y.o. female  presents for a complete physical and follow up for has Anxiety; OAB (overactive bladder); History of prediabetes; Medication management; Osteopenia of multiple sites; BMI 26.0-26.9,adult; Hyperlipidemia; Vitamin D deficiency; Psoriasis; GERD (gastroesophageal reflux disease); and Hypertension on their problem list. Hx of provoked PE after humerus fracture without recurrence after tapering off of coumadin.   She is married, is an Therapist, sports in our office.   She has no concerns today.   She is followed by Dr. Jari Pigg for psoriasis previously on methotrexate, currently off and doing well.   She has hx of osteoporosis, currently off of fosamax since 01/2018 (formerly following 18 month on, 6 month off schedule); taking intermittently since ?2013 since humerus fracture; most recent DEXA in 09/2018 demonstrated L fem T -1.9. She is on vitamin D and calcium supplements.  Has hx of vaginal hysterectomy and had  bladder sling in 2000; had frequent UTIs in the past with cystocele and was referred to Dr. Alona Bene with [facility] and underwent anterior cystocele repair per his recommendation.  Wears a pad (2 during the day while at work).   She has been prescribed zoloft 25 mg daily for many years which she continues to feel is beneficial for  mild anxiety. Husband has Parkinson's and somewhat progressing.   she has a diagnosis of GERD which is currently managed by prilosec 40 mg daily she reports symptoms is currently well controlled, and denies breakthrough reflux, burning in chest, hoarseness or cough.    BMI is Body mass index is 26.79 kg/m., she is working on diet and exercise. She successfully lost over 50 lb in the last few years from peak weight 214 lb on 11/30/2016 with phentermine and has successfully maintained this since cessation.  Wt Readings from Last 3 Encounters:  08/03/19 161 lb (73 kg)  12/22/18 162 lb 6.4 oz (73.7 kg)  07/13/18 164 lb (74.4 kg)   Her blood pressure has been controlled at home, today their BP is BP: 120/70 She does workout. She denies chest pain, shortness of breath, dizziness.   She is not on cholesterol medication and denies myalgias. Her cholesterol is not at goal. The cholesterol last visit was:   Lab Results  Component Value Date   CHOL 185 12/22/2018   HDL 67 12/22/2018   LDLCALC 104 (H) 12/22/2018   TRIG 58 12/22/2018   CHOLHDL 2.8 12/22/2018   She has been working on diet and exercise for hx of prediabetes, she is on bASA, she is not on ACE/ARB and denies foot ulcerations, increased appetite, nausea, paresthesia of the feet, polydipsia, polyuria, visual disturbances and vomiting. Last A1C in the office was:  Lab Results  Component Value Date   HGBA1C 5.6 07/13/2018   Last GFR: Lab Results  Component Value Date   GFRNONAA 99 12/22/2018   Patient is on Vitamin D supplement.   Lab Results  Component Value Date   VD25OH 91 12/15/2017      Current Medications:  Current Outpatient Medications on File Prior to Visit  Medication Sig Dispense Refill  . aspirin EC 81 MG tablet Take 81 mg by mouth daily.    . calcipotriene (DOVONOX) 0.005 % cream Apply 1 application topically at bedtime.    . calcium carbonate (TUMS EX) 750 MG chewable tablet Chew 1 tablet by mouth daily.    .  cetirizine (ZYRTEC) 10 MG tablet Take 10 mg by mouth as needed for allergies.    . Cholecalciferol (VITAMIN D-3) 5000 UNITS TABS Take 5,000 Units by mouth 2 (two) times daily.    Marland Kitchen CINNAMON PO Take 1,000 mg by mouth daily.    Marland Kitchen CRANBERRY PO Take by mouth. Take 2 caplets BID    . hydrochlorothiazide (HYDRODIURIL) 25 MG tablet TAKE 1/2 TO 1 TABLET BY MOUTH DAILY FOR BLOOD PRESSURE AND FLUID 90 tablet 2  . Multiple Vitamin (MULTI-VITAMIN DAILY PO) Take 1 tablet by mouth daily.    Marland Kitchen omeprazole (PRILOSEC) 40 MG capsule Take 1 capsule (40 mg total) by mouth daily. 90 capsule 3  . Probiotic Product (PROBIOTIC DAILY PO) Take by mouth. 1 capsule daily    . sertraline (ZOLOFT) 25 MG tablet Take 1 tablet (25 mg total) by mouth daily. 90 tablet 3  . triamcinolone cream (KENALOG) 0.1 % Apply 1 application topically 2 (two) times daily. 80  g 1  . Zinc 50 MG TABS Take by mouth daily.     No current facility-administered medications on file prior to visit.    Allergies:  Allergies  Allergen Reactions  . Penicillins Rash  . Latex Rash   Medical History:  She has Anxiety; OAB (overactive bladder); History of prediabetes; Medication management; Osteopenia of multiple sites; BMI 26.0-26.9,adult; Hyperlipidemia; Vitamin D deficiency; Psoriasis; GERD (gastroesophageal reflux disease); and Hypertension on their problem list. Health Maintenance:   Immunization History  Administered Date(s) Administered  . Hepatitis B 01/11/2013  . Influenza Split 07/25/2012, 08/12/2014, 08/21/2015  . Influenza, High Dose Seasonal PF 08/02/2017, 07/13/2018, 08/03/2019  . Influenza,inj,quad, With Preservative 08/13/2016  . PPD Test 07/31/2014, 07/28/2015, 08/13/2016, 07/13/2018  . Pneumococcal Conjugate-13 12/15/2017  . Pneumococcal Polysaccharide-23 04/16/2002  . Tdap 12/21/2011  . Zoster 10/26/2003   Preventative care: Tetanus: 2014 Pneumovax: 1996, 2003 Prevnar 13: 2019 Flu vaccine: 2019 DUE Zostavax: 2005   Pap:  S/p hysterectomy, last pelvic 2020  Last colonoscopy: 2014? No reports, patient called, needs repeat 5 years due 09/2018 - will schedule this year - postponed due to recent surgery - has scheduled 08/27/2019 Mammogram: 07/2018, has scheduled 09/12/2019 Dexa: 09/2018 L fem T -1.9 ECHO: 2014  Names of Other Physician/Practitioners you currently use: 1. [facility] here for primary care 2. Dr. Prudencio Burly, eye doctor, last visit 02/2019 3. Dr. Colin Benton, dentist, last visit 05/2019, goes q14m 3. Dr. Delman Cheadle, derm, goes q6m, psoriasis,    Patient Care Team: Unk Pinto, MD as PCP - General (Internal Medicine) Jari Pigg, MD as Consulting Physician (Dermatology) Marybelle Killings, MD as Consulting Physician (Orthopedic Surgery)  Surgical History:  She has a past surgical history that includes Tonsillectomy (1962); Appendectomy (1959); Bladder suspension (11/1998); Ureter revision (Left, 11/3003); Extensor tendon of forearm / wrist repair (Left, 2010); ORIF humerus fracture (04/07/2012); Steriod injection (04/07/2012); Breast cyst aspiration (Right, 2011); vaginal hysterectomy with unilateral salpingooopherectomy (2000); Cataract extraction, bilateral (Bilateral, 2016); Cystocele repair (11/10/2018); and Cystocele repair (11/10/2018). Family History:  Herfamily history includes Colon cancer in her maternal uncle; Colon cancer (age of onset: 63) in her father; Heart disease in her maternal grandfather and mother; Heart failure (age of onset: 64) in her maternal grandfather. Social History:  She reports that she has never smoked. She has never used smokeless tobacco. She reports that she does not drink alcohol or use drugs.  Review of Systems: Review of Systems  Constitutional: Negative for malaise/fatigue and weight loss.  HENT: Negative for hearing loss and tinnitus.   Eyes: Negative for blurred vision and double vision.  Respiratory: Negative for cough, shortness of  breath and wheezing.   Cardiovascular: Negative for chest pain, palpitations, orthopnea, claudication and leg swelling.  Gastrointestinal: Negative for abdominal pain, blood in stool, constipation, diarrhea, heartburn, melena, nausea and vomiting.  Genitourinary: Positive for frequency (chronic). Negative for dysuria, flank pain, hematuria and urgency.  Musculoskeletal: Negative for joint pain and myalgias.  Skin: Negative for rash.  Neurological: Negative for dizziness, tingling, sensory change, weakness and headaches.  Endo/Heme/Allergies: Negative for polydipsia.  Psychiatric/Behavioral: Negative.   All other systems reviewed and are negative.   Physical Exam: Estimated body mass index is 26.79 kg/m as calculated from the following:   Height as of this encounter: 5\' 5"  (1.651 m).   Weight as of this encounter: 161 lb (73 kg). BP 120/70   Pulse 91   Temp (!) 97.5 F (36.4 C)   Ht 5\' 5"  (1.651  m)   Wt 161 lb (73 kg)   SpO2 98%   BMI 26.79 kg/m  General Appearance: Well nourished, in no apparent distress.  Eyes: PERRLA, EOMs, conjunctiva no swelling or erythema; fundal exam deferred to ophth Sinuses: No Frontal/maxillary tenderness  ENT/Mouth: Ext aud canals clear, normal light reflex with TMs without erythema, bulging. Good dentition. No erythema, swelling, or exudate on post pharynx. Tonsils not swollen or erythematous. Hearing normal.  Neck: Supple, thyroid normal. No bruits  Respiratory: Respiratory effort normal, BS equal bilaterally without rales, rhonchi, wheezing or stridor.  Cardio: RRR without murmurs, rubs or gallops. No carotid or abdominal aortic bruit. Brisk peripheral pulses without edema.  Chest: symmetric, with normal excursions. Breasts: Patient declines  Abdomen: Soft, nontender, no guarding, rebound, hernias, masses, or organomegaly.  Lymphatics: Non tender without lymphadenopathy.  Genitourinary: Deferred Musculoskeletal: Full ROM all peripheral  extremities,5/5 strength, and normal gait.  Skin: Warm, dry without rashes, lesions, ecchymosis. Neuro: Cranial nerves intact, reflexes equal bilaterally. Normal muscle tone, no cerebellar symptoms. Sensation intact.  Psych: Awake and oriented X 3, normal affect, Insight and Judgment appropriate.   EKG: WNL, NSCPT   Gorden Harms Khylah Kendra 10:51 AM [facility]

## 2019-08-03 ENCOUNTER — Encounter: Payer: Self-pay | Admitting: Adult Health

## 2019-08-03 ENCOUNTER — Ambulatory Visit (INDEPENDENT_AMBULATORY_CARE_PROVIDER_SITE_OTHER): Payer: Medicare HMO | Admitting: Adult Health

## 2019-08-03 ENCOUNTER — Other Ambulatory Visit: Payer: Self-pay

## 2019-08-03 VITALS — BP 120/70 | HR 91 | Temp 97.5°F | Ht 65.0 in | Wt 161.0 lb

## 2019-08-03 DIAGNOSIS — Z23 Encounter for immunization: Secondary | ICD-10-CM

## 2019-08-03 DIAGNOSIS — E785 Hyperlipidemia, unspecified: Secondary | ICD-10-CM | POA: Diagnosis not present

## 2019-08-03 DIAGNOSIS — Z136 Encounter for screening for cardiovascular disorders: Secondary | ICD-10-CM | POA: Diagnosis not present

## 2019-08-03 DIAGNOSIS — Z131 Encounter for screening for diabetes mellitus: Secondary | ICD-10-CM | POA: Diagnosis not present

## 2019-08-03 DIAGNOSIS — N3281 Overactive bladder: Secondary | ICD-10-CM

## 2019-08-03 DIAGNOSIS — E559 Vitamin D deficiency, unspecified: Secondary | ICD-10-CM

## 2019-08-03 DIAGNOSIS — K219 Gastro-esophageal reflux disease without esophagitis: Secondary | ICD-10-CM

## 2019-08-03 DIAGNOSIS — M8589 Other specified disorders of bone density and structure, multiple sites: Secondary | ICD-10-CM

## 2019-08-03 DIAGNOSIS — Z0000 Encounter for general adult medical examination without abnormal findings: Secondary | ICD-10-CM

## 2019-08-03 DIAGNOSIS — Z0001 Encounter for general adult medical examination with abnormal findings: Secondary | ICD-10-CM | POA: Diagnosis not present

## 2019-08-03 DIAGNOSIS — Z6826 Body mass index (BMI) 26.0-26.9, adult: Secondary | ICD-10-CM

## 2019-08-03 DIAGNOSIS — Z87898 Personal history of other specified conditions: Secondary | ICD-10-CM

## 2019-08-03 DIAGNOSIS — Z79899 Other long term (current) drug therapy: Secondary | ICD-10-CM

## 2019-08-03 DIAGNOSIS — I10 Essential (primary) hypertension: Secondary | ICD-10-CM

## 2019-08-03 DIAGNOSIS — F419 Anxiety disorder, unspecified: Secondary | ICD-10-CM

## 2019-08-03 DIAGNOSIS — L409 Psoriasis, unspecified: Secondary | ICD-10-CM

## 2019-08-03 MED ORDER — FAMOTIDINE 20 MG PO TABS: 20.0000 mg | ORAL_TABLET | Freq: Two times a day (BID) | ORAL | 1 refills | Status: DC

## 2019-08-03 NOTE — Patient Instructions (Addendum)
Vanessa French , Thank you for taking time to come for your Annual Wellness Visit. I appreciate your ongoing commitment to your health goals. Please review the following plan we discussed and let me know if I can assist you in the future.   These are the goals we discussed: Goals    . Blood Pressure < 130/80    . LDL CALC < 100    . Weight (lb) < 165 lb (74.8 kg)    . Weights/resistance exercises 2-3x per week       This is a list of the screening recommended for you and due dates:  Health Maintenance  Topic Date Due  . Flu Shot  05/26/2019  .  Hepatitis C: One time screening is recommended by Center for Disease Control  (CDC) for  adults born from [birth year] through 1964.   12/23/2019*  . Mammogram  08/09/2020  . Tetanus Vaccine  08/05/2023  . Colon Cancer Screening  09/25/2023  . DEXA scan (bone density measurement)  Completed  . Pneumonia vaccines  Discontinued  *Topic was postponed. The date shown is not the original due date.      Know what a healthy weight is for you (roughly BMI <25) and aim to maintain this  Aim for 7+ servings of fruits and vegetables daily  65-80+ fluid ounces of water or unsweet tea for healthy kidneys  Limit to max 1 drink of alcohol per day; avoid smoking/tobacco  Limit animal fats in diet for cholesterol and heart health - choose grass fed whenever available  Avoid highly processed foods, and foods high in saturated/trans fats  Aim for low stress - take time to unwind and care for your mental health  Aim for 150 min of moderate intensity exercise weekly for heart health, and weights twice weekly for bone health  Aim for 7-9 hours of sleep daily     Osteopenia  Osteopenia is a loss of thickness (density) inside of the bones. Another name for osteopenia is low bone mass. Mild osteopenia is a normal part of aging. It is not a disease, and it does not cause symptoms. However, if you have osteopenia and continue to lose bone mass, you could develop a  condition that causes the bones to become thin and break more easily (osteoporosis). You may also lose some height, have back pain, and have a stooped posture. Although osteopenia is not a disease, making changes to your lifestyle and diet can help to prevent osteopenia from developing into osteoporosis. What are the causes? Osteopenia is caused by loss of calcium in the bones.  Bones are constantly changing. Old bone cells are continually being replaced with new bone cells. This process builds new bone. The mineral calcium is needed to build new bone and maintain bone density. Bone density is usually highest around age 14. After that, most people's bodies cannot replace all the bone they have lost with new bone. What increases the risk? You are more likely to develop this condition if:  You are older than age 24.  You are a woman who went through menopause early.  You have a long illness that keeps you in bed.  You do not get enough exercise.  You lack certain nutrients (malnutrition).  You have an overactive thyroid gland (hyperthyroidism).  You smoke.  You drink a lot of alcohol.  You are taking medicines that weaken the bones, such as steroids. What are the signs or symptoms? This condition does not cause any symptoms. You  may have a slightly higher risk for bone breaks (fractures), so getting fractures more easily than normal may be an indication of osteopenia. How is this diagnosed? Your health care provider can diagnose this condition with a special type of X-ray exam that measures bone density (dual-energy X-ray absorptiometry, DEXA). This test can measure bone density in your hips, spine, and wrists. Osteopenia has no symptoms, so this condition is usually diagnosed after a routine bone density screening test is done for osteoporosis. This routine screening is usually done for:  Women who are age 43 or older.  Men who are age 53 or older. If you have risk factors for  osteopenia, you may have the screening test at an earlier age. How is this treated? Making dietary and lifestyle changes can lower your risk for osteoporosis. If you have severe osteopenia that is close to becoming osteoporosis, your health care provider may prescribe medicines and dietary supplements such as calcium and vitamin D. These supplements help to rebuild bone density. Follow these instructions at home:   Take over-the-counter and prescription medicines only as told by your health care provider. These include vitamins and supplements.  Eat a diet that is high in calcium and vitamin D. ? Calcium is found in dairy products, beans, salmon, and leafy green vegetables like spinach and broccoli. ? Look for foods that have vitamin D and calcium added to them (fortified foods), such as orange juice, cereal, and bread.  Do 30 or more minutes of a weight-bearing exercise every day, such as walking, jogging, or playing a sport. These types of exercises strengthen the bones.  Take precautions at home to lower your risk of falling, such as: ? Keeping rooms well-lit and free of clutter, such as cords. ? Installing safety rails on stairs. ? Using rubber mats in the bathroom or other areas that are often wet or slippery.  Do not use any products that contain nicotine or tobacco, such as cigarettes and e-cigarettes. If you need help quitting, ask your health care provider.  Avoid alcohol or limit alcohol intake to no more than 1 drink a day for nonpregnant women and 2 drinks a day for men. One drink equals 12 oz of beer, 5 oz of wine, or 1 oz of hard liquor.  Keep all follow-up visits as told by your health care provider. This is important. Contact a health care provider if:  You have not had a bone density screening for osteoporosis and you are: ? A woman, age 41 or older. ? A man, age 39 or older.  You are a postmenopausal woman who has not had a bone density screening for  osteoporosis.  You are older than age 83 and you want to know if you should have bone density screening for osteoporosis. Summary  Osteopenia is a loss of thickness (density) inside of the bones. Another name for osteopenia is low bone mass.  Osteopenia is not a disease, but it may increase your risk for a condition that causes the bones to become thin and break more easily (osteoporosis).  You may be at risk for osteopenia if you are older than age 61 or if you are a woman who went through early menopause.  Osteopenia does not cause any symptoms, but it can be diagnosed with a bone density screening test.  Dietary and lifestyle changes are the first treatment for osteopenia. These may lower your risk for osteoporosis. This information is not intended to replace advice given to you by your  health care provider. Make sure you discuss any questions you have with your health care provider. Document Released: 07/20/2017 Document Revised: 09/23/2017 Document Reviewed: 07/20/2017 Elsevier Patient Education  2020 [employer].

## 2019-08-04 LAB — COMPLETE METABOLIC PANEL WITH GFR
AG Ratio: 1.3 (calc) (ref 1.0–2.5)
ALT: 7 U/L (ref 6–29)
AST: 12 U/L (ref 10–35)
Albumin: 3.9 g/dL (ref 3.6–5.1)
Alkaline phosphatase (APISO): 59 U/L (ref 37–153)
BUN: 8 mg/dL (ref 7–25)
CO2: 33 mmol/L — ABNORMAL HIGH (ref 20–32)
Calcium: 9.1 mg/dL (ref 8.6–10.4)
Chloride: 98 mmol/L (ref 98–110)
Creat: 0.57 mg/dL (ref 0.50–0.99)
GFR, Est African American: 111 mL/min/{1.73_m2} (ref >=60)
GFR, Est Non African American: 96 mL/min/{1.73_m2} (ref >=60)
Globulin: 2.9 g/dL (calc) (ref 1.9–3.7)
Glucose, Bld: 96 mg/dL (ref 65–99)
Potassium: 3.7 mmol/L (ref 3.5–5.3)
Sodium: 137 mmol/L (ref 135–146)
Total Bilirubin: 0.6 mg/dL (ref 0.2–1.2)
Total Protein: 6.8 g/dL (ref 6.1–8.1)

## 2019-08-04 LAB — URINALYSIS, ROUTINE W REFLEX MICROSCOPIC
Bacteria, UA: NONE SEEN /HPF
Bilirubin Urine: NEGATIVE
Glucose, UA: NEGATIVE
Hyaline Cast: NONE SEEN /LPF
Ketones, ur: NEGATIVE
Nitrite: NEGATIVE
Protein, ur: NEGATIVE
Specific Gravity, Urine: 1.012 (ref 1.001–1.03)
Squamous Epithelial / HPF: NONE SEEN /HPF (ref <=5)
WBC, UA: >=60 /HPF — AB (ref 0–5)
pH: 8 (ref 5.0–8.0)

## 2019-08-04 LAB — CBC WITH DIFFERENTIAL/PLATELET
Absolute Monocytes: 484 cells/uL (ref 200–950)
Basophils Absolute: 28 cells/uL (ref 0–200)
Basophils Relative: 0.5 %
Eosinophils Absolute: 110 cells/uL (ref 15–500)
Eosinophils Relative: 2 %
HCT: 43.6 % (ref 35.0–45.0)
Hemoglobin: 14.8 g/dL (ref 11.7–15.5)
Lymphs Abs: 1441 cells/uL (ref 850–3900)
MCH: 30.8 pg (ref 27.0–33.0)
MCHC: 33.9 g/dL (ref 32.0–36.0)
MCV: 90.8 fL (ref 80.0–100.0)
MPV: 9.6 fL (ref 7.5–12.5)
Monocytes Relative: 8.8 %
Neutro Abs: 3438 cells/uL (ref 1500–7800)
Neutrophils Relative %: 62.5 %
Platelets: 206 10*3/uL (ref 140–400)
RBC: 4.8 10*6/uL (ref 3.80–5.10)
RDW: 12.1 % (ref 11.0–15.0)
Total Lymphocyte: 26.2 %
WBC: 5.5 10*3/uL (ref 3.8–10.8)

## 2019-08-04 LAB — LIPID PANEL
Cholesterol: 184 mg/dL (ref <200)
HDL: 61 mg/dL (ref >=50)
LDL Cholesterol (Calc): 108 mg/dL (calc) — ABNORMAL HIGH
Non-HDL Cholesterol (Calc): 123 mg/dL (calc) (ref <130)
Total CHOL/HDL Ratio: 3 (calc) (ref <5.0)
Triglycerides: 61 mg/dL (ref <150)

## 2019-08-04 LAB — HEMOGLOBIN A1C
Hgb A1c MFr Bld: 5.3 % of total Hgb (ref <5.7)
Mean Plasma Glucose: 105 (calc)
eAG (mmol/L): 5.8 (calc)

## 2019-08-04 LAB — MICROALBUMIN / CREATININE URINE RATIO
Creatinine, Urine: 69 mg/dL (ref 20–275)
Microalb Creat Ratio: 17 mcg/mg creat (ref <30)
Microalb, Ur: 1.2 mg/dL

## 2019-08-04 LAB — VITAMIN D 25 HYDROXY (VIT D DEFICIENCY, FRACTURES): Vit D, 25-Hydroxy: 77 ng/mL (ref 30–100)

## 2019-08-04 LAB — MAGNESIUM: Magnesium: 2 mg/dL (ref 1.5–2.5)

## 2019-08-04 LAB — TSH: TSH: 0.9 mIU/L (ref 0.40–4.50)

## 2019-08-17 ENCOUNTER — Other Ambulatory Visit: Payer: Self-pay | Admitting: Internal Medicine

## 2019-08-17 ENCOUNTER — Other Ambulatory Visit: Payer: Self-pay | Admitting: Adult Health

## 2019-08-17 DIAGNOSIS — R609 Edema, unspecified: Secondary | ICD-10-CM

## 2019-08-22 DIAGNOSIS — Z1159 Encounter for screening for other viral diseases: Secondary | ICD-10-CM | POA: Diagnosis not present

## 2019-08-27 DIAGNOSIS — Z86010 Personal history of colonic polyps: Secondary | ICD-10-CM | POA: Diagnosis not present

## 2019-08-27 DIAGNOSIS — K5730 Diverticulosis of large intestine without perforation or abscess without bleeding: Secondary | ICD-10-CM | POA: Diagnosis not present

## 2019-08-27 LAB — HM COLONOSCOPY

## 2019-09-12 ENCOUNTER — Ambulatory Visit
Admission: RE | Admit: 2019-09-12 | Discharge: 2019-09-12 | Disposition: A | Discharge status: Home / Self Care | Payer: Medicare HMO | Source: Ambulatory Visit | Attending: Adult Health | Admitting: Adult Health

## 2019-09-12 ENCOUNTER — Encounter: Payer: Self-pay | Admitting: Internal Medicine

## 2019-09-12 ENCOUNTER — Other Ambulatory Visit: Payer: Self-pay

## 2019-09-12 DIAGNOSIS — Z1231 Encounter for screening mammogram for malignant neoplasm of breast: Secondary | ICD-10-CM

## 2019-10-12 DIAGNOSIS — D2372 Other benign neoplasm of skin of left lower limb, including hip: Secondary | ICD-10-CM | POA: Diagnosis not present

## 2019-10-12 DIAGNOSIS — Z23 Encounter for immunization: Secondary | ICD-10-CM | POA: Diagnosis not present

## 2019-10-12 DIAGNOSIS — L821 Other seborrheic keratosis: Secondary | ICD-10-CM | POA: Diagnosis not present

## 2019-10-12 DIAGNOSIS — L409 Psoriasis, unspecified: Secondary | ICD-10-CM | POA: Diagnosis not present

## 2019-10-12 DIAGNOSIS — D2271 Melanocytic nevi of right lower limb, including hip: Secondary | ICD-10-CM | POA: Diagnosis not present

## 2019-10-16 ENCOUNTER — Other Ambulatory Visit: Payer: Self-pay

## 2019-11-17 ENCOUNTER — Other Ambulatory Visit: Payer: Self-pay | Admitting: Internal Medicine

## 2019-11-17 DIAGNOSIS — R609 Edema, unspecified: Secondary | ICD-10-CM

## 2019-11-27 ENCOUNTER — Encounter (INDEPENDENT_AMBULATORY_CARE_PROVIDER_SITE_OTHER): Payer: Self-pay | Admitting: Otolaryngology

## 2019-11-27 ENCOUNTER — Other Ambulatory Visit: Payer: Self-pay

## 2019-11-27 ENCOUNTER — Ambulatory Visit (INDEPENDENT_AMBULATORY_CARE_PROVIDER_SITE_OTHER): Payer: Medicare HMO | Admitting: Otolaryngology

## 2019-11-27 VITALS — Temp 97.2°F

## 2019-11-27 DIAGNOSIS — H9313 Tinnitus, bilateral: Secondary | ICD-10-CM

## 2019-11-27 DIAGNOSIS — H903 Sensorineural hearing loss, bilateral: Secondary | ICD-10-CM | POA: Diagnosis not present

## 2019-11-27 NOTE — Progress Notes (Signed)
HPI: Vanessa French is a 69 y.o. female who presents is referred by Dr. Melford Aase For evaluation of ringing in her ears.  She has noticed this for the past 4 to 5 weeks.  It seems to bother her more when several people are speaking or talking.  She denies any loud noise exposure or trauma to the ears.  She has not previously had a hearing test.  She denies noticing any significant.  Hearing change or pain or discomfort.  She has some difficulty in understanding speech clearly when there is background noise.  Past Medical History:  Diagnosis Date  . Frequent UTI   . GERD (gastroesophageal reflux disease)   . History of pulmonary embolus (PE) 11/03/2012  . Psoriasis    Past Surgical History:  Procedure Laterality Date  . APPENDECTOMY  1959  . BLADDER SUSPENSION  11/1998  . BREAST CYST ASPIRATION Right 2011  . CATARACT EXTRACTION, BILATERAL Bilateral 2016   Dr. Prudencio Burly  . CYSTOCELE REPAIR  11/10/2018   Dr. Alona Bene, [facility]  . CYSTOCELE REPAIR  11/10/2018   Dr. Alona Bene  . EXTENSOR TENDON OF FOREARM / WRIST REPAIR Left 2010   LEFT ARM  . ORIF HUMERUS FRACTURE  04/07/2012   Procedure: OPEN REDUCTION INTERNAL FIXATION (ORIF) PROXIMAL HUMERUS FRACTURE;  Surgeon: Marybelle Killings, MD;  Location: [place];  Service: Orthopedics;  Laterality: Right;  Open Reduction Internal Fixation Right Proxmial Humerus  . STERIOD INJECTION  04/07/2012   Procedure: STEROID INJECTION;  Surgeon: Marybelle Killings, MD;  Location: [place];  Service: Orthopedics;  Laterality: Right;   Right 1st Dorsal Compartment Injection  . TONSILLECTOMY  1962  . URETER REVISION Left 11/3003   COMPLIATION OF BLADDER SLING  . vaginal hysterectomy with unilateral salpingooopherectomy  2000   Social History   Socioeconomic History  . Marital status: Married    Spouse name: Not on file  . Number of children: 3  . Years of education: Not on file  . Highest education level: Not on file  Occupational History  . Occupation: Programmer, multimedia:  [employer]  Tobacco Use  . Smoking status: Never Smoker  . Smokeless tobacco: Never Used  Substance and Sexual Activity  . Alcohol use: No  . Drug use: No  . Sexual activity: Yes    Partners: Male    Birth control/protection: Post-menopausal  Other Topics Concern  . Not on file  Social History Narrative  . Not on file   Social Determinants of Health   Financial Resource Strain:   . Difficulty of Paying Living Expenses: Not on file  Food Insecurity:   . Worried About Charity fundraiser in the Last Year: Not on file  . Ran Out of Food in the Last Year: Not on file  Transportation Needs:   . Lack of Transportation (Medical): Not on file  . Lack of Transportation (Non-Medical): Not on file  Physical Activity: Sufficiently Active  . Days of Exercise per Week: 5 days  . Minutes of Exercise per Session: 30 min  Stress:   . Feeling of Stress : Not on file  Social Connections:   . Frequency of Communication with Friends and Family: Not on file  . Frequency of Social Gatherings with Friends and Family: Not on file  . Attends Religious Services: Not on file  . Active Member of Clubs or Organizations: Not on file  . Attends Archivist Meetings: Not on file  . Marital Status:  Not on file   Family History  Problem Relation Age of Onset  . Heart disease Mother   . Heart disease Maternal Grandfather   . Heart failure Maternal Grandfather 60  . Colon cancer Father 72  . Colon cancer Maternal Uncle    Allergies  Allergen Reactions  . Penicillins Rash  . Latex Rash   Prior to Admission medications   Medication Sig Start Date End Date Taking? Authorizing Provider  aspirin EC 81 MG tablet Take 81 mg by mouth daily.   Yes [provider]  calcipotriene (DOVONOX) 0.005 % cream Apply 1 application topically at bedtime.   Yes [provider]  calcium carbonate (TUMS EX) 750 MG chewable tablet Chew 1 tablet by mouth daily.   Yes [provider]   Cholecalciferol (VITAMIN D-3) 5000 UNITS TABS Take 5,000 Units by mouth 2 (two) times daily.   Yes [provider]  CINNAMON PO Take 1,000 mg by mouth daily.   Yes [provider]  CRANBERRY PO Take by mouth. Take 2 caplets BID   Yes [provider]  famotidine (PEPCID) 20 MG tablet Take 1 tablet (20 mg total) by mouth 2 (two) times daily. 08/03/19 08/02/20 Yes [name], NP  hydrochlorothiazide (HYDRODIURIL) 25 MG tablet Take 1 tablet Daily for BP and Fluid Retention / Ankle Swelling 11/17/19  Yes Unk Pinto, MD  Multiple Vitamin (MULTI-VITAMIN DAILY PO) Take 1 tablet by mouth daily.   Yes [provider]  sertraline (ZOLOFT) 25 MG tablet TAKE ONE TABLET BY MOUTH DAILY 08/17/19  Yes [name], NP  triamcinolone cream (KENALOG) 0.1 % Apply 1 application topically 2 (two) times daily. 02/21/19  Yes [name], NP  Zinc 50 MG TABS Take by mouth daily.   Yes [provider]  cetirizine (ZYRTEC) 10 MG tablet Take 10 mg by mouth as needed for allergies.    [provider]  omeprazole (PRILOSEC) 40 MG capsule Take 1 capsule (40 mg total) by mouth daily. Patient not taking: Reported on 11/27/2019 02/21/19 02/21/20  [name], NP  Probiotic Product (PROBIOTIC DAILY PO) Take by mouth. 1 capsule daily    [provider]     Positive ROS: She has had no dizziness or vertigo.  All other systems have been reviewed and were otherwise negative with the exception of those mentioned in the HPI and as above.  Physical Exam: Constitutional: Alert, well-appearing, no acute distress Ears: External ears without lesions or tenderness. Ear canals are clear bilaterally with intact, clear TMs bilaterally with good mobility on pneumatic otoscopy.  On tuning fork testing she heard slightly better on the left side with a 512 4 and about equal with the 1024 tuning fork.  AC > BC bilaterally. Nasal: External nose without lesions. Septum  relatively midline.. Clear nasal passages bilaterally with no signs of infection Oral: Lips and gums without lesions. Tongue and palate mucosa without lesions. Posterior oropharynx clear. Neck: No palpable adenopathy or masses Respiratory: Breathing comfortably  Skin: No facial/neck lesions or rash noted.  Audiogram in the office today demonstrated essentially normal hearing in the left ear with a slight high-frequency SNHL above 3000 frequency.  On examination of the right ear she has a mild low-frequency SNHL but otherwise the hearing mimics the left ear from 1000 frequency to 8000 frequency again with a mild high-frequency sensorineural hearing loss above 4000 frequency.  SRT's were 15 dB on the right and 5 dB on the left.  Word discrimination scores were  above 96% bilaterally.  Type A tympanograms bilaterally.  Procedures  Assessment: Very minimal upper frequency SNHL consistent with presbycusis which is mild. Tinnitus questionable etiology of recent onset  Plan: Suggested trying Lipo flavonoid as this is beneficial in some people with tinnitus. Discussed using masking noise to help control tinnitus when she is in a quiet environment. She will follow-up if she notices any worsening of her tinnitus or hearing.   Radene Journey, MD

## 2019-11-30 ENCOUNTER — Encounter (INDEPENDENT_AMBULATORY_CARE_PROVIDER_SITE_OTHER): Payer: Self-pay

## 2019-12-23 ENCOUNTER — Ambulatory Visit: Payer: Medicare HMO | Attending: Internal Medicine

## 2019-12-23 DIAGNOSIS — Z23 Encounter for immunization: Secondary | ICD-10-CM | POA: Insufficient documentation

## 2019-12-23 NOTE — Progress Notes (Signed)
   Covid-19 Vaccination Clinic  Name:  MEYA TROSCLAIR    MRN: [id number] DOB: 08-17-1951  12/23/2019  Ms. Kimbler was observed post Covid-19 immunization for 15 minutes without incidence. She was provided with Vaccine Information Sheet and instruction to access the V-Safe system.   Ms. Wehunt was instructed to call 911 with any severe reactions post vaccine: Marland Kitchen Difficulty breathing  . Swelling of your face and throat  . A fast heartbeat  . A bad rash all over your body  . Dizziness and weakness    Immunizations Administered    Name Date Dose VIS Date Route   Pfizer COVID-19 Vaccine 12/23/2019  9:21 AM 0.3 mL 10/05/2019 Intramuscular   Manufacturer: [place]   Lot: [id number]   [place]: [id number]

## 2019-12-28 ENCOUNTER — Ambulatory Visit: Payer: Self-pay | Admitting: Adult Health

## 2020-01-16 ENCOUNTER — Ambulatory Visit: Payer: Medicare HMO | Attending: Internal Medicine

## 2020-01-16 DIAGNOSIS — Z23 Encounter for immunization: Secondary | ICD-10-CM

## 2020-01-16 NOTE — Progress Notes (Signed)
   Covid-19 Vaccination Clinic  Name:  Vanessa French    MRN: [id number] DOB: 11/17/1950  01/16/2020  [name] was observed post Covid-19 immunization for 15 minutes without incident. She was provided with Vaccine Information Sheet and instruction to access the V-Safe system.   [name] was instructed to call 911 with any severe reactions post vaccine: Marland Kitchen Difficulty breathing  . Swelling of face and throat  . A fast heartbeat  . A bad rash all over body  . Dizziness and weakness   Immunizations Administered    Name Date Dose VIS Date Route   Pfizer COVID-19 Vaccine 01/16/2020  4:14 PM 0.3 mL 10/05/2019 Intramuscular   Manufacturer: [place]   Lot: [id number]   [place]: [id number]

## 2020-01-29 ENCOUNTER — Other Ambulatory Visit: Payer: Self-pay | Admitting: Adult Health

## 2020-02-04 ENCOUNTER — Other Ambulatory Visit: Payer: Medicare HMO

## 2020-02-04 ENCOUNTER — Other Ambulatory Visit: Payer: Self-pay

## 2020-02-04 ENCOUNTER — Other Ambulatory Visit: Payer: Self-pay | Admitting: Internal Medicine

## 2020-02-04 DIAGNOSIS — R300 Dysuria: Secondary | ICD-10-CM

## 2020-02-05 IMAGING — MG DIGITAL SCREENING BILAT W/ TOMO W/ CAD
8 series · 8 of 24 positions shown · non-contrast
Comparison: Previous exam(s).

CLINICAL DATA: Screening.

EXAM:
DIGITAL SCREENING BILATERAL MAMMOGRAM WITH TOMO AND CAD

[R MLO synth-2D]
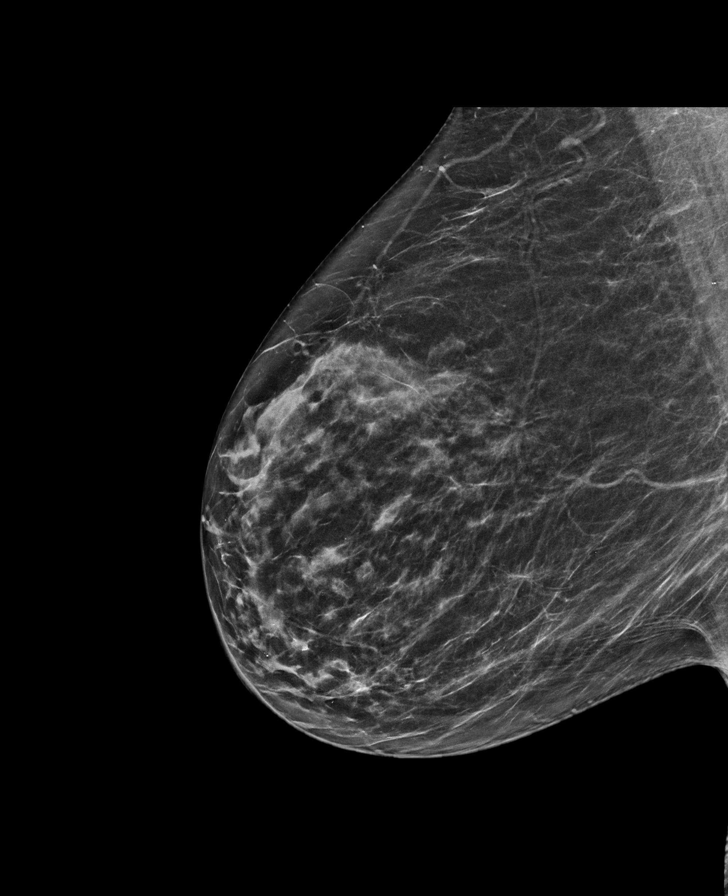

[R CC synth-2D]
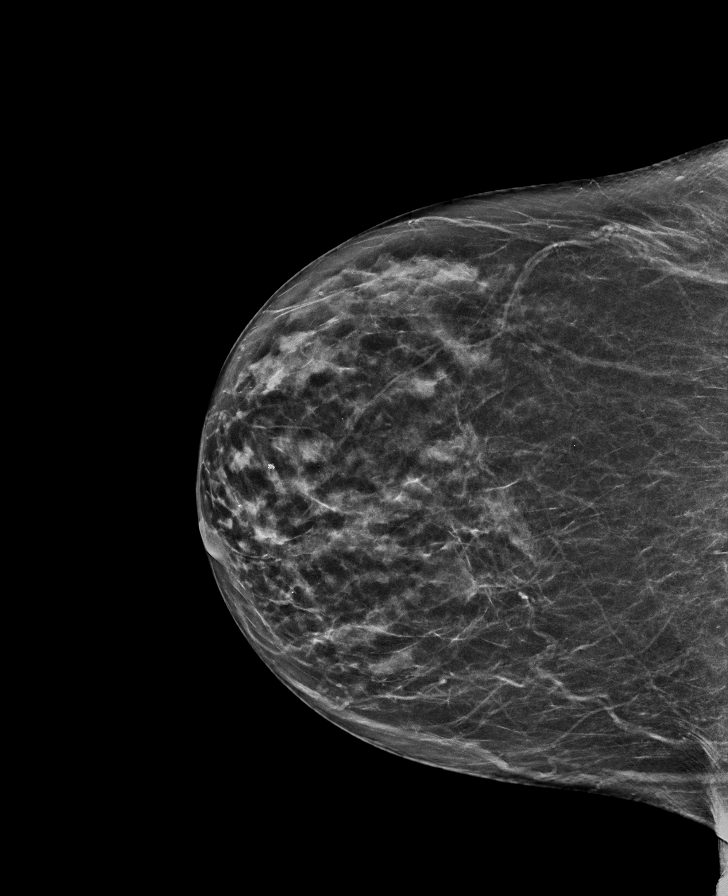

[L MLO synth-2D]
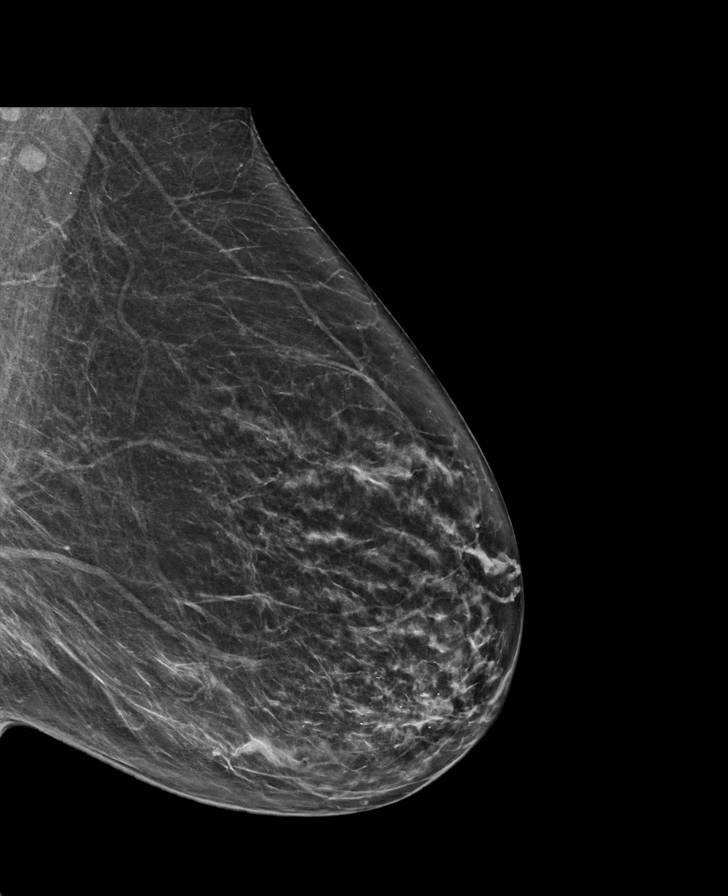

[L CC synth-2D]
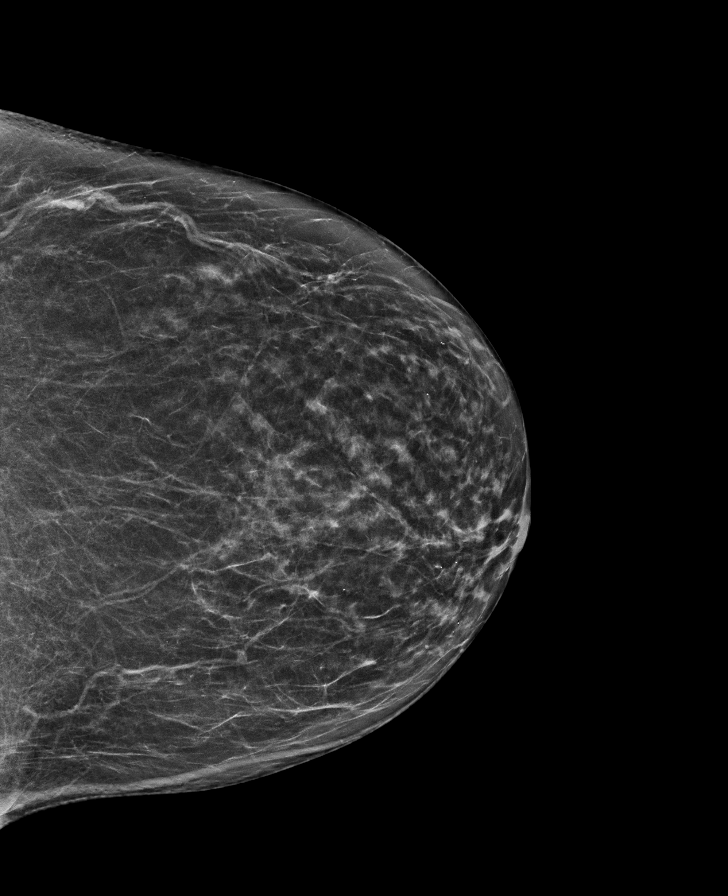

[R MLO tomo · tomo slice 35/70.0]
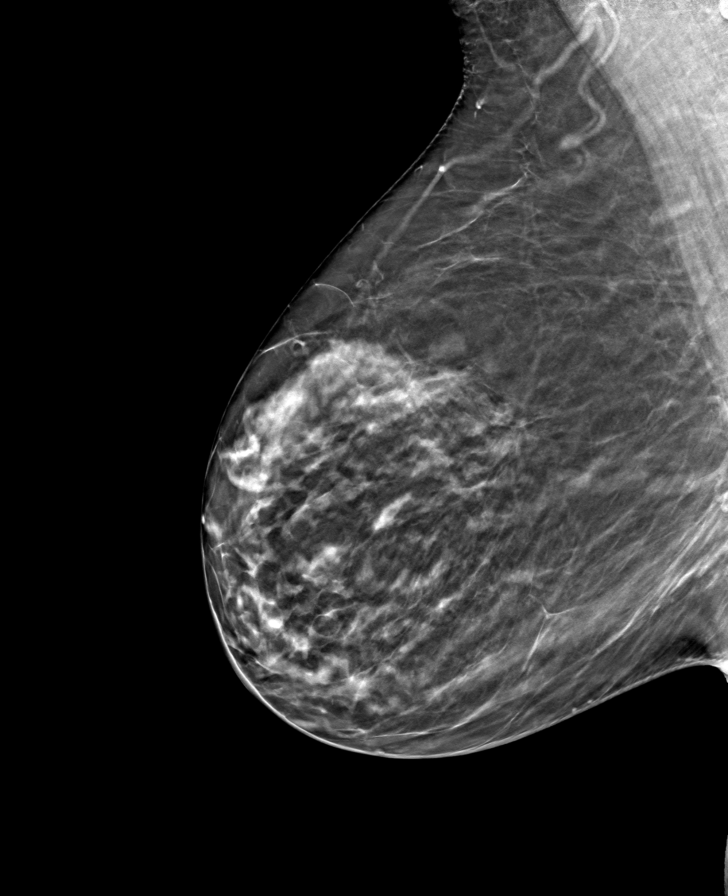

[L MLO tomo · tomo slice 36/71.0]
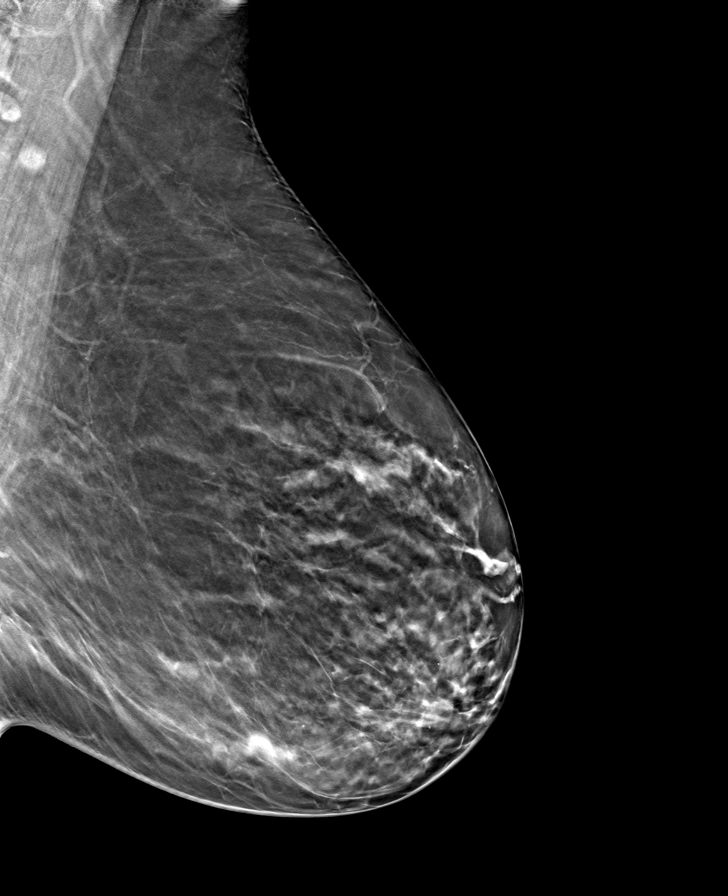

[L CC tomo · tomo slice 31/62.0]
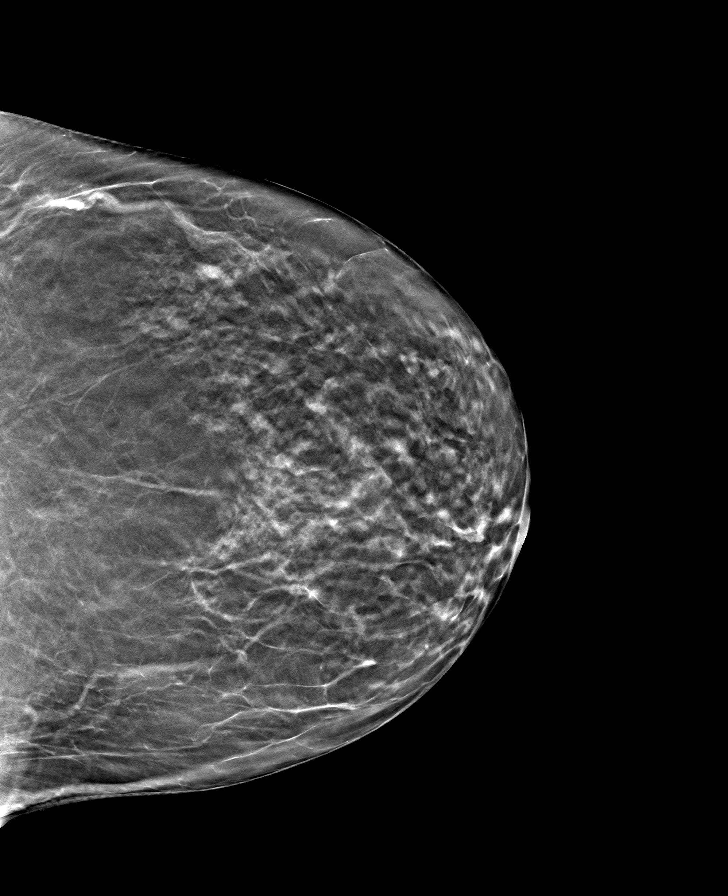

[R CC tomo · tomo slice 33/65.0]
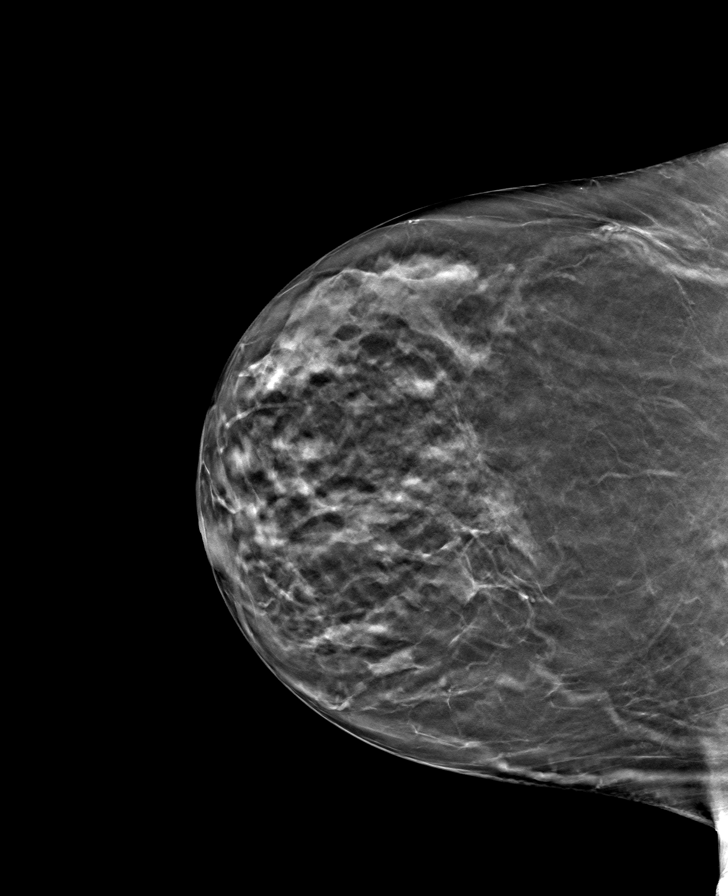

[8 of 24 positions shown; findings below may reference images not displayed]

ACR Breast Density Category b: There are scattered areas of
fibroglandular density.
FINDINGS: There are no findings suspicious for malignancy. Images were
processed with CAD.
IMPRESSION: No mammographic evidence of malignancy. A result letter of this
screening mammogram will be mailed directly to the patient.

RECOMMENDATION:
Screening mammogram in one year. (Code:[id number])

BI-RADS CATEGORY  1: Negative.

## 2020-02-07 ENCOUNTER — Other Ambulatory Visit: Payer: Self-pay | Admitting: Adult Health

## 2020-02-07 LAB — URINALYSIS, ROUTINE W REFLEX MICROSCOPIC
Bacteria, UA: NONE SEEN /HPF
Bilirubin Urine: NEGATIVE
Glucose, UA: NEGATIVE
Hgb urine dipstick: NEGATIVE
Hyaline Cast: NONE SEEN /LPF
Ketones, ur: NEGATIVE
Nitrite: NEGATIVE
Protein, ur: NEGATIVE
Specific Gravity, Urine: 1.011 (ref 1.001–1.03)
Squamous Epithelial / HPF: NONE SEEN /HPF (ref <=5)
pH: 8 (ref 5.0–8.0)

## 2020-02-07 LAB — URINE CULTURE
MICRO NUMBER:: 10353408
SPECIMEN QUALITY:: ADEQUATE

## 2020-02-07 MED ORDER — NITROFURANTOIN MONOHYD MACRO 100 MG PO CAPS: 100.0000 mg | ORAL_CAPSULE | Freq: Two times a day (BID) | ORAL | 0 refills | Status: DC

## 2020-02-14 ENCOUNTER — Encounter: Payer: Self-pay | Admitting: Adult Health

## 2020-02-14 ENCOUNTER — Other Ambulatory Visit: Payer: Self-pay | Admitting: Adult Health

## 2020-02-14 ENCOUNTER — Other Ambulatory Visit: Payer: Self-pay

## 2020-02-14 DIAGNOSIS — K5730 Diverticulosis of large intestine without perforation or abscess without bleeding: Secondary | ICD-10-CM

## 2020-02-14 HISTORY — DX: Diverticulosis of large intestine without perforation or abscess without bleeding: K57.30

## 2020-02-14 MED ORDER — NITROFURANTOIN MONOHYD MACRO 100 MG PO CAPS: 100.0000 mg | ORAL_CAPSULE | Freq: Two times a day (BID) | ORAL | 0 refills | Status: DC

## 2020-02-14 MED ORDER — FAMOTIDINE 20 MG PO TABS: ORAL_TABLET | ORAL | 3 refills | Status: DC

## 2020-02-14 NOTE — Progress Notes (Signed)
MEDICARE ANNUAL WELLNESS VISIT AND FOLLOW UP  Assessment:    Encounter for Annual Medicare Wellness Visit  Hypertension Well controlled; continue current medications Monitor blood pressure at home; call if consistently over 130/80 Continue DASH diet.   Reminder to go to the ER if any CP, SOB, nausea, dizziness, severe HA, changes vision/speech, left arm numbness and tingling and jaw pain.  OAB (overactive bladder) Improved s/p repair of bladder prolapse.  She tapered off of myrbetriq and feels managing well at this time Wears 1-2 liners/day  Anxiety Well managed by current regimen; continue medications Stress management techniques discussed, increase water, good sleep hygiene discussed, increase exercise, and increase veggies.   History of pulmonary embolus (PE) Provoked, after humerus fracture; was on coumadin but has been tapered off with no recurrent issues.   Other abnormal glucose Recent A1Cs at goal Discussed diet/exercise, weight management  Defer A1C; check CMP   BMI 26 Long discussion about weight loss, diet, and exercise Recommended diet heavy in fruits and veggies and low in animal meats, cheeses, and dairy products, appropriate calorie intake Discussed appropriate weight for height  Patient will work on restarting exercise, resume walking for 10000 steps daily Excellent progress with phentermine, remains maintained off of medication Follow up at next visit  Hyperipidemia Very mild elevation currently treated by lifestyle only Continue low cholesterol diet and exercise.  -     Lipid panel -     TSH  Vitamin D deficiency -     VITAMIN D 25 Hydroxy (Vit-D Deficiency, Fractures)  Medication management -     CBC with Differential/Platelet -     CMP/GFR  Osteopenia of multiple sites Due for repeat Dexa 09/2020; has been on fosamax without issues following an 18 month on, 6 month off schedule per recommendation by ortho.  She has been off since 01/2018, will  remain off until follow up DEXA.   Psoriasis Previously treated by methotrexate but has been tapered off by derm; doing well without significant flares. Doing topical dovonox and triamcinolone and remains well controlled.   Perineal burning Will try triamcinolone 0.1% BID, follow up if not improving  Over 40 minutes of exam, counseling, chart review and critical decision making was performed  Future Appointments  Date Time Provider [place]  08/04/2020 10:00 AM [name], NP [facility] None      Plan:   During the course of the visit the patient was educated and counseled about appropriate screening and preventive services including:    Pneumococcal vaccine   Prevnar 13  Influenza vaccine  Td vaccine  Screening electrocardiogram  Bone densitometry screening  Colorectal cancer screening  Diabetes screening  Glaucoma screening  Nutrition counseling   Advanced directives: requested   Subjective:  Vanessa French is a 69 y.o. female who presents for Medicare Annual Wellness Visit and 6 month follow up.   She just completed macrobid for UTI, dysuria much improved, does report some perineal burning, only at night, none during the day, no discharge. Improved by tylenol and benadryl.   She is followed by Dr. Jari Pigg for psoriasis previously on methotrexate, currently off and doing well.   She has hx of osteoporosis, currently off of fosamax since 01/2018 (formerly following 18 month on, 6 month off schedule); taking intermittently since ?2013 since humerus fracture; most recent DEXA in 09/2018 demonstrated L fem T -1.9. She is on vitamin D and calcium supplements.   Has hx of vaginal hysterectomy and had  bladder sling in  2000; had frequent UTIs in the past with cystocele and was referred to [facility]. Alona Bene with [facility] and underwent anterior cystocele repair per his recommendation.  Wears a pad (2 during the day while at work).   She has been  prescribed zoloft 25 mg daily for many years which she continues to feel is beneficial for mild anxiety. Husband has Parkinson's and somewhat progressing.   she has a diagnosis of GERD which is currently managed by prilosec 40 mg daily she reports symptoms is currently well controlled, and denies breakthrough reflux, burning in chest, hoarseness or cough.    BMI is Body mass index is 27.07 kg/m., she has been working on diet and exercise, She successfully lost over 50 lb in the last few years from peak weight 214 lb on 11/30/2016 with phentermine and has successfully maintained this since cessation.   She reports her husband was diagnosed with Parkinson's, fell out of walking but has restarted, 3 days week.  For diet, focusing on fruits and vegetables, meat one a day if at all, very rare red meat, eats nuts and occasionally beans. Drinks 4 bottles of water daily.  Wt Readings from Last 3 Encounters:  02/15/20 162 lb 11.2 oz (73.8 kg)  08/03/19 161 lb (73 kg)  12/22/18 162 lb 6.4 oz (73.7 kg)    Her blood pressure has been controlled at home, today their BP is BP: 122/70 She does not workout. She denies chest pain, shortness of breath, dizziness.   She is not on cholesterol medication and denies myalgias. Her cholesterol is not at goal. The cholesterol last visit was:   Lab Results  Component Value Date   CHOL 184 08/03/2019   HDL 61 08/03/2019   LDLCALC 108 (H) 08/03/2019   TRIG 61 08/03/2019   CHOLHDL 3.0 08/03/2019    She has not been working on diet and exercise for prediabetes, recently with A1C normalized following 50 lb weight loss, and denies increased appetite, nausea, paresthesia of the feet, polydipsia, polyuria, visual disturbances, vomiting and weight loss. Last A1C in the office was:  Lab Results  Component Value Date   HGBA1C 5.3 08/03/2019   Last GFR: Lab Results  Component Value Date   GFRNONAA 96 08/03/2019   Patient is on Vitamin D supplement and was at goal at the  last check:   Lab Results  Component Value Date   VD25OH 77 08/03/2019      Medication Review: Current Outpatient Medications on File Prior to Visit  Medication Sig Dispense Refill  . Ascorbic Acid (VITAMIN C/ROSE HIPS PO) Take 500 mg by mouth daily.    Marland Kitchen aspirin EC 81 MG tablet Take 81 mg by mouth daily.    . calcipotriene (DOVONOX) 0.005 % cream Apply 1 application topically at bedtime.    . calcium carbonate (TUMS EX) 750 MG chewable tablet Chew 1 tablet by mouth daily.    . cetirizine (ZYRTEC) 10 MG tablet Take 10 mg by mouth as needed for allergies.    . Cholecalciferol (VITAMIN D-3) 5000 UNITS TABS Take 5,000 Units by mouth 2 (two) times daily.    Marland Kitchen CINNAMON PO Take 1,000 mg by mouth daily.    Marland Kitchen CRANBERRY PO Take by mouth. Take 2 caplets BID    . famotidine (PEPCID) 20 MG tablet Take 1 tablet 2 x /day for Indigestion & Heartburn 180 tablet 3  . hydrochlorothiazide (HYDRODIURIL) 25 MG tablet Take 1 tablet Daily for BP and Fluid Retention / Ankle Swelling 90  tablet 1  . Multiple Vitamin (MULTI-VITAMIN DAILY PO) Take 1 tablet by mouth daily.    . Probiotic Product (PROBIOTIC DAILY PO) Take by mouth. 1 capsule daily    . sertraline (ZOLOFT) 25 MG tablet TAKE ONE TABLET BY MOUTH DAILY 78 tablet 2  . triamcinolone cream (KENALOG) 0.1 % Apply 1 application topically 2 (two) times daily. 80 g 1  . Zinc 50 MG TABS Take by mouth daily.     No current facility-administered medications on file prior to visit.    Allergies  Allergen Reactions  . Penicillins Rash  . Latex Rash    Current Problems (verified) Patient Active Problem List   Diagnosis Date Noted  . Hypertension 12/21/2018  . GERD (gastroesophageal reflux disease) 07/13/2018  . Psoriasis 12/15/2017  . Medication management 12/14/2017  . Osteopenia of multiple sites 12/14/2017  . BMI 26.0-26.9,adult 12/14/2017  . Hyperlipidemia 12/14/2017  . Vitamin D deficiency 12/14/2017  . OAB (overactive bladder) 11/30/2016  .  History of prediabetes 11/30/2016  . Anxiety 11/03/2012    Screening Tests Immunization History  Administered Date(s) Administered  . Hepatitis B 01/11/2013  . Influenza Split 07/25/2012, 08/12/2014, 08/21/2015  . Influenza, High Dose Seasonal PF 08/02/2017, 07/13/2018, 08/03/2019  . Influenza,inj,quad, With Preservative 08/13/2016  . PFIZER SARS-COV-2 Vaccination 12/23/2019, 01/16/2020  . PPD Test 07/31/2014, 07/28/2015, 08/13/2016, 07/13/2018  . Pneumococcal Conjugate-13 12/15/2017  . Pneumococcal Polysaccharide-23 04/16/2002  . Tdap 12/21/2011  . Zoster 10/26/2003   Preventative care: Tetanus: 2014 Pneumovax: 1996, 2003 Prevnar 13: 2019 Flu vaccine: 07/2019 Zostavax: 2005   Pap: S/p hysterectomy, last pelvic 2020  Last colonoscopy: 08/27/2019, 5 year follow up, family history  Mammogram: 09/12/2019 Dexa: 09/2018 L fem T -1.9, get with next mammogram  ECHO: 2014  Names of Other Physician/Practitioners you currently use: 1.  [facility] for [facility] 2. Dr. Prudencio Burly, eye doctor, last visit 02/2019,  Has upcoming on 5/13 3. Dr. Colin Benton, dentist, last visit 12/2019, goes q27m 3. Dr. Delman Cheadle, derm, psoriasis, now seeing annually fall 2020  Patient Care Team: Unk Pinto, MD as PCP - General (Internal Medicine) Jari Pigg, MD as Consulting Physician (Dermatology) Marybelle Killings, MD as Consulting Physician (Orthopedic Surgery)  SURGICAL HISTORY She  has a past surgical history that includes Tonsillectomy (1962); Appendectomy (1959); Bladder suspension (11/1998); Ureter revision (Left, 11/3003); Extensor tendon of forearm / wrist repair (Left, 2010); ORIF humerus fracture (04/07/2012); Steriod injection (04/07/2012); Breast cyst aspiration (Right, 2011); vaginal hysterectomy with unilateral salpingooopherectomy (2000); Cataract extraction, bilateral (Bilateral, 2016); Cystocele repair (11/10/2018); and Cystocele repair  (11/10/2018). FAMILY HISTORY Her family history includes Colon cancer in her maternal uncle; Colon cancer (age of onset: 33) in her father; Heart disease in her maternal grandfather and mother; Heart failure (age of onset: 64) in her maternal grandfather. SOCIAL HISTORY She  reports that she has never smoked. She has never used smokeless tobacco. She reports that she does not drink alcohol or use drugs.   MEDICARE WELLNESS OBJECTIVES: Physical activity:   Cardiac risk factors:   Depression/mood screen:   Depression screen [facility] 2/9 08/03/2019  Decreased Interest 0  Down, Depressed, Hopeless 0  PHQ - 2 Score 0    ADLs:  No flowsheet data found.   Cognitive Testing  Alert? Yes  Normal Appearance?Yes  Oriented to person? Yes  Place? Yes   Time? Yes  Recall of three objects?  Yes  Can perform simple calculations? Yes  Displays appropriate judgment?Yes  Can read the  correct time from a watch face?Yes  EOL planning: Does Patient Have a Medical Advance Directive?: Yes Type of Advance Directive: Healthcare Power of Attorney, Living will Does patient want to make changes to medical advance directive?: No - Patient declined Copy of [place] in Chart?: No - copy requested  Review of Systems  Constitutional: Negative for chills, fever, malaise/fatigue and weight loss.  HENT: Negative for hearing loss and tinnitus.   Eyes: Negative for blurred vision and double vision.  Respiratory: Negative for cough, shortness of breath and wheezing.   Cardiovascular: Negative for chest pain, palpitations, orthopnea, claudication and leg swelling.  Gastrointestinal: Negative for abdominal pain, blood in stool, constipation, diarrhea, heartburn, melena, nausea and vomiting.  Genitourinary: Positive for frequency and urgency. Negative for dysuria, flank pain and hematuria.  Musculoskeletal: Negative for falls, joint pain and myalgias.  Skin: Negative for rash.  Neurological: Negative  for dizziness, tingling, sensory change, weakness and headaches.  Endo/Heme/Allergies: Negative for polydipsia.  Psychiatric/Behavioral: Negative.  Negative for depression, memory loss and substance abuse. The patient is not nervous/anxious and does not have insomnia.   All other systems reviewed and are negative.    Objective:     Today's Vitals   02/15/20 0911  BP: 122/70  Pulse: 66  Temp: 97.7 F (36.5 C)  SpO2: 99%  Weight: 162 lb 11.2 oz (73.8 kg)  Height: 5\' 5"  (1.651 m)   Body mass index is 27.07 kg/m.  General appearance: alert, no distress, WD/WN, female HEENT: normocephalic, sclerae anicteric, TMs pearly, nares patent, no discharge or erythema, pharynx normal Oral cavity: MMM, no lesions Neck: supple, no lymphadenopathy, no thyromegaly, no masses Heart: RRR, normal S1, S2, no murmurs Lungs: CTA bilaterally, no wheezes, rhonchi, or rales Abdomen: +bs, soft, non tender, non distended, no masses, no hepatomegaly, no splenomegaly Musculoskeletal: nontender, no swelling, no obvious deformity Extremities: no edema, no cyanosis, no clubbing Pulses: 2+ symmetric, upper and lower extremities, normal cap refill Neurological: alert, oriented x 3, CN2-12 intact, strength normal upper extremities and lower extremities, sensation normal throughout, DTRs 2+ throughout, no cerebellar signs, gait normal Psychiatric: normal affect, behavior normal, pleasant   Medicare Attestation I have personally reviewed: The patient's medical and social history Their use of alcohol, tobacco or illicit drugs Their current medications and supplements The patient's functional ability including ADLs,fall risks, home safety risks, cognitive, and hearing and visual impairment Diet and physical activities Evidence for depression or mood disorders  The patient's weight, height, BMI, and visual acuity have been recorded in the chart.  I have made referrals, counseling, and provided education to the  patient based on review of the above and I have provided the patient with a written personalized care plan for preventive services.     Izora Ribas, NP   02/15/2020

## 2020-02-15 ENCOUNTER — Encounter: Payer: Self-pay | Admitting: Adult Health

## 2020-02-15 ENCOUNTER — Ambulatory Visit (INDEPENDENT_AMBULATORY_CARE_PROVIDER_SITE_OTHER): Payer: Medicare HMO | Admitting: Adult Health

## 2020-02-15 ENCOUNTER — Other Ambulatory Visit: Payer: Self-pay

## 2020-02-15 VITALS — BP 122/70 | HR 66 | Temp 97.7°F | Ht 65.0 in | Wt 162.7 lb

## 2020-02-15 DIAGNOSIS — Z0000 Encounter for general adult medical examination without abnormal findings: Secondary | ICD-10-CM

## 2020-02-15 DIAGNOSIS — R69 Illness, unspecified: Secondary | ICD-10-CM | POA: Diagnosis not present

## 2020-02-15 DIAGNOSIS — L409 Psoriasis, unspecified: Secondary | ICD-10-CM | POA: Diagnosis not present

## 2020-02-15 DIAGNOSIS — Z0001 Encounter for general adult medical examination with abnormal findings: Secondary | ICD-10-CM

## 2020-02-15 DIAGNOSIS — Z79899 Other long term (current) drug therapy: Secondary | ICD-10-CM

## 2020-02-15 DIAGNOSIS — R6889 Other general symptoms and signs: Secondary | ICD-10-CM | POA: Diagnosis not present

## 2020-02-15 DIAGNOSIS — M8589 Other specified disorders of bone density and structure, multiple sites: Secondary | ICD-10-CM | POA: Diagnosis not present

## 2020-02-15 DIAGNOSIS — K219 Gastro-esophageal reflux disease without esophagitis: Secondary | ICD-10-CM

## 2020-02-15 DIAGNOSIS — N3281 Overactive bladder: Secondary | ICD-10-CM

## 2020-02-15 DIAGNOSIS — I10 Essential (primary) hypertension: Secondary | ICD-10-CM

## 2020-02-15 DIAGNOSIS — Z87898 Personal history of other specified conditions: Secondary | ICD-10-CM | POA: Diagnosis not present

## 2020-02-15 DIAGNOSIS — K5730 Diverticulosis of large intestine without perforation or abscess without bleeding: Secondary | ICD-10-CM

## 2020-02-15 DIAGNOSIS — E785 Hyperlipidemia, unspecified: Secondary | ICD-10-CM

## 2020-02-15 DIAGNOSIS — E559 Vitamin D deficiency, unspecified: Secondary | ICD-10-CM | POA: Diagnosis not present

## 2020-02-15 DIAGNOSIS — Z6826 Body mass index (BMI) 26.0-26.9, adult: Secondary | ICD-10-CM | POA: Diagnosis not present

## 2020-02-15 DIAGNOSIS — F419 Anxiety disorder, unspecified: Secondary | ICD-10-CM

## 2020-02-16 LAB — CBC WITH DIFFERENTIAL/PLATELET
Absolute Monocytes: 428 cells/uL (ref 200–950)
Basophils Absolute: 28 cells/uL (ref 0–200)
Basophils Relative: 0.7 %
Eosinophils Absolute: 68 cells/uL (ref 15–500)
Eosinophils Relative: 1.7 %
HCT: 47.4 % — ABNORMAL HIGH (ref 35.0–45.0)
Hemoglobin: 15.8 g/dL — ABNORMAL HIGH (ref 11.7–15.5)
Lymphs Abs: 1428 cells/uL (ref 850–3900)
MCH: 30.7 pg (ref 27.0–33.0)
MCHC: 33.3 g/dL (ref 32.0–36.0)
MCV: 92.2 fL (ref 80.0–100.0)
MPV: 9.9 fL (ref 7.5–12.5)
Monocytes Relative: 10.7 %
Neutro Abs: 2048 cells/uL (ref 1500–7800)
Neutrophils Relative %: 51.2 %
Platelets: 213 10*3/uL (ref 140–400)
RBC: 5.14 10*6/uL — ABNORMAL HIGH (ref 3.80–5.10)
RDW: 12.2 % (ref 11.0–15.0)
Total Lymphocyte: 35.7 %
WBC: 4 10*3/uL (ref 3.8–10.8)

## 2020-02-16 LAB — LIPID PANEL
Cholesterol: 204 mg/dL — ABNORMAL HIGH (ref <200)
HDL: 66 mg/dL (ref >=50)
LDL Cholesterol (Calc): 123 mg/dL (calc) — ABNORMAL HIGH
Non-HDL Cholesterol (Calc): 138 mg/dL (calc) — ABNORMAL HIGH (ref <130)
Total CHOL/HDL Ratio: 3.1 (calc) (ref <5.0)
Triglycerides: 63 mg/dL (ref <150)

## 2020-02-16 LAB — COMPLETE METABOLIC PANEL WITH GFR
AG Ratio: 1.5 (calc) (ref 1.0–2.5)
ALT: 11 U/L (ref 6–29)
AST: 12 U/L (ref 10–35)
Albumin: 4.3 g/dL (ref 3.6–5.1)
Alkaline phosphatase (APISO): 60 U/L (ref 37–153)
BUN: 12 mg/dL (ref 7–25)
CO2: 33 mmol/L — ABNORMAL HIGH (ref 20–32)
Calcium: 9.8 mg/dL (ref 8.6–10.4)
Chloride: 98 mmol/L (ref 98–110)
Creat: 0.58 mg/dL (ref 0.50–0.99)
GFR, Est African American: 110 mL/min/{1.73_m2} (ref >=60)
GFR, Est Non African American: 95 mL/min/{1.73_m2} (ref >=60)
Globulin: 2.9 g/dL (calc) (ref 1.9–3.7)
Glucose, Bld: 99 mg/dL (ref 65–99)
Potassium: 3.9 mmol/L (ref 3.5–5.3)
Sodium: 138 mmol/L (ref 135–146)
Total Bilirubin: 0.7 mg/dL (ref 0.2–1.2)
Total Protein: 7.2 g/dL (ref 6.1–8.1)

## 2020-02-16 LAB — MAGNESIUM: Magnesium: 2.2 mg/dL (ref 1.5–2.5)

## 2020-02-16 LAB — TSH: TSH: 1.41 mIU/L (ref 0.40–4.50)

## 2020-03-03 ENCOUNTER — Other Ambulatory Visit: Payer: Self-pay | Admitting: Adult Health

## 2020-03-03 ENCOUNTER — Other Ambulatory Visit: Payer: Self-pay

## 2020-03-03 ENCOUNTER — Other Ambulatory Visit: Payer: Medicare HMO

## 2020-03-03 DIAGNOSIS — N3000 Acute cystitis without hematuria: Secondary | ICD-10-CM

## 2020-03-06 ENCOUNTER — Other Ambulatory Visit: Payer: Self-pay | Admitting: Adult Health

## 2020-03-06 LAB — URINALYSIS W MICROSCOPIC + REFLEX CULTURE
Bilirubin Urine: NEGATIVE
Glucose, UA: NEGATIVE
Hgb urine dipstick: NEGATIVE
Ketones, ur: NEGATIVE
Nitrites, Initial: NEGATIVE
Protein, ur: NEGATIVE
Specific Gravity, Urine: 1.014 (ref 1.001–1.03)
Squamous Epithelial / HPF: NONE SEEN /HPF (ref <=5)
pH: 7 (ref 5.0–8.0)

## 2020-03-06 LAB — URINE CULTURE
MICRO NUMBER:: 10463417
SPECIMEN QUALITY:: ADEQUATE

## 2020-03-06 LAB — CULTURE INDICATED

## 2020-03-06 MED ORDER — NITROFURANTOIN MONOHYD MACRO 100 MG PO CAPS: 100.0000 mg | ORAL_CAPSULE | Freq: Two times a day (BID) | ORAL | 0 refills | Status: DC

## 2020-04-22 ENCOUNTER — Other Ambulatory Visit: Payer: Self-pay | Admitting: Adult Health

## 2020-05-21 ENCOUNTER — Other Ambulatory Visit: Payer: Self-pay | Admitting: Adult Health

## 2020-05-21 MED ORDER — PHENTERMINE HCL 37.5 MG PO TABS
ORAL_TABLET | ORAL | 0 refills | Status: DC
Start: 2020-05-21 — End: 2020-08-06

## 2020-06-18 DIAGNOSIS — Z961 Presence of intraocular lens: Secondary | ICD-10-CM | POA: Diagnosis not present

## 2020-06-18 DIAGNOSIS — H40053 Ocular hypertension, bilateral: Secondary | ICD-10-CM | POA: Diagnosis not present

## 2020-06-18 DIAGNOSIS — H524 Presbyopia: Secondary | ICD-10-CM | POA: Diagnosis not present

## 2020-06-29 ENCOUNTER — Other Ambulatory Visit: Payer: Self-pay | Admitting: Internal Medicine

## 2020-06-29 DIAGNOSIS — M546 Pain in thoracic spine: Secondary | ICD-10-CM

## 2020-07-01 ENCOUNTER — Other Ambulatory Visit: Payer: Self-pay

## 2020-07-01 ENCOUNTER — Ambulatory Visit
Admission: RE | Admit: 2020-07-01 | Discharge: 2020-07-01 | Disposition: A | Discharge status: Home / Self Care | Payer: Medicare HMO | Source: Ambulatory Visit | Attending: Internal Medicine | Admitting: Internal Medicine

## 2020-07-01 DIAGNOSIS — M546 Pain in thoracic spine: Secondary | ICD-10-CM

## 2020-07-01 NOTE — Progress Notes (Signed)
Results reviewed with patient

## 2020-07-04 ENCOUNTER — Other Ambulatory Visit: Payer: Self-pay | Admitting: Internal Medicine

## 2020-07-04 MED ORDER — CYCLOBENZAPRINE HCL 10 MG PO TABS
ORAL_TABLET | ORAL | 1 refills | Status: DC
Start: 2020-07-04 — End: 2021-04-24

## 2020-07-11 ENCOUNTER — Other Ambulatory Visit: Payer: Medicare HMO

## 2020-07-11 ENCOUNTER — Other Ambulatory Visit: Payer: Self-pay

## 2020-07-11 ENCOUNTER — Other Ambulatory Visit: Payer: Self-pay | Admitting: Physician Assistant

## 2020-07-11 ENCOUNTER — Ambulatory Visit
Admission: RE | Admit: 2020-07-11 | Discharge: 2020-07-11 | Disposition: A | Discharge status: Home / Self Care | Payer: Medicare HMO | Source: Ambulatory Visit | Attending: Physician Assistant | Admitting: Physician Assistant

## 2020-07-11 DIAGNOSIS — M546 Pain in thoracic spine: Secondary | ICD-10-CM

## 2020-07-11 DIAGNOSIS — I951 Orthostatic hypotension: Secondary | ICD-10-CM

## 2020-07-11 DIAGNOSIS — R0602 Shortness of breath: Secondary | ICD-10-CM

## 2020-07-11 LAB — CBC WITH DIFFERENTIAL/PLATELET
Absolute Monocytes: 846 cells/uL (ref 200–950)
Basophils Absolute: 28 cells/uL (ref 0–200)
Basophils Relative: 0.3 %
Eosinophils Absolute: 28 cells/uL (ref 15–500)
Eosinophils Relative: 0.3 %
HCT: 43.8 % (ref 35.0–45.0)
Hemoglobin: 14.3 g/dL (ref 11.7–15.5)
Lymphs Abs: 1278 cells/uL (ref 850–3900)
MCH: 30.4 pg (ref 27.0–33.0)
MCHC: 32.6 g/dL (ref 32.0–36.0)
MCV: 93.2 fL (ref 80.0–100.0)
MPV: 9.1 fL (ref 7.5–12.5)
Monocytes Relative: 9 %
Neutro Abs: 7219 cells/uL (ref 1500–7800)
Neutrophils Relative %: 76.8 %
Platelets: 294 10*3/uL (ref 140–400)
RBC: 4.7 10*6/uL (ref 3.80–5.10)
RDW: 12.1 % (ref 11.0–15.0)
Total Lymphocyte: 13.6 %
WBC: 9.4 10*3/uL (ref 3.8–10.8)

## 2020-07-11 LAB — COMPLETE METABOLIC PANEL WITH GFR
AG Ratio: 1.3 (calc) (ref 1.0–2.5)
ALT: 10 U/L (ref 6–29)
AST: 11 U/L (ref 10–35)
Albumin: 4 g/dL (ref 3.6–5.1)
Alkaline phosphatase (APISO): 103 U/L (ref 37–153)
BUN: 15 mg/dL (ref 7–25)
CO2: 32 mmol/L (ref 20–32)
Calcium: 9.4 mg/dL (ref 8.6–10.4)
Chloride: 95 mmol/L — ABNORMAL LOW (ref 98–110)
Creat: 0.7 mg/dL (ref 0.50–0.99)
GFR, Est African American: 103 mL/min/{1.73_m2} (ref >=60)
GFR, Est Non African American: 89 mL/min/{1.73_m2} (ref >=60)
Globulin: 3 g/dL (calc) (ref 1.9–3.7)
Glucose, Bld: 108 mg/dL — ABNORMAL HIGH (ref 65–99)
Potassium: 3.9 mmol/L (ref 3.5–5.3)
Sodium: 132 mmol/L — ABNORMAL LOW (ref 135–146)
Total Bilirubin: 0.5 mg/dL (ref 0.2–1.2)
Total Protein: 7 g/dL (ref 6.1–8.1)

## 2020-07-11 LAB — D-DIMER, QUANTITATIVE: D-Dimer, Quant: 3.6 mcg/mL FEU — ABNORMAL HIGH (ref <0.50)

## 2020-07-11 MED ORDER — CALCITONIN (SALMON) 200 UNIT/ACT NA SOLN: 1.0000 | Freq: Every day | NASAL | 0 refills | Status: DC

## 2020-07-11 NOTE — Progress Notes (Signed)
69 YO WF with history of compression fractures, provoked PE after surgery presents with continuing back pain and some SOB after a fall down her stairs at home 06/30/20. She has been on mobic, decadron, gabapentin, has been having some pain but got worse last night, Thoracic mid line back pain with radiation to right side, worse with deep breath. She has also had some increasing SOB with walking, O2 95% in the office RA, some decreased RLL breath sounds, will get CXR and ddimer with patients history of PE.  She has had some orthostatic hypotension with some sinus tachy, she has cut her HCTZ in half.   Will send in calcitonin spray x 2-4 weeks to help with pain. Get CBC, CMET, DDimer Stat and proceed with chest xray. May proceed with MRI back if all above negative.    The patient was advised to call immediately if she has any concerning symptoms in the interval. The patient voices understanding of current treatment options and is in agreement with the current care plan.The patient knows to call the clinic with any problems, questions or concerns or go to the ER if any further progression of symptoms.

## 2020-07-13 ENCOUNTER — Other Ambulatory Visit: Payer: Self-pay | Admitting: Internal Medicine

## 2020-07-13 DIAGNOSIS — S22080A Wedge compression fracture of T11-T12 vertebra, initial encounter for closed fracture: Secondary | ICD-10-CM

## 2020-07-13 MED ORDER — GABAPENTIN 100 MG PO CAPS: ORAL_CAPSULE | ORAL | 0 refills | Status: DC

## 2020-07-13 NOTE — Progress Notes (Signed)
  CXR (9/17) - delayed report finally available and shows lungs clear with worsening (75%) Compression Fx of T11 Vertebra compared to recent T-spine X-rays 10 days ago (07/01/20)    Patient had abrupt worsening of LBP on 9/17 prompting re-X-ray.    On Gabapentin 300 mg bid & will change to Gabapentin 300 mg qhs   & add Gabapentin 100 mg tid w/Meals      Consult requested with Dr Lorin Mercy who saw her years ago with a T12 Vertebral Compression Fx

## 2020-07-14 ENCOUNTER — Telehealth: Payer: Self-pay | Admitting: Internal Medicine

## 2020-07-14 ENCOUNTER — Other Ambulatory Visit: Payer: Self-pay | Admitting: Physician Assistant

## 2020-07-14 ENCOUNTER — Telehealth: Payer: Self-pay | Admitting: Physician Assistant

## 2020-07-14 DIAGNOSIS — R0602 Shortness of breath: Secondary | ICD-10-CM

## 2020-07-14 NOTE — Telephone Encounter (Signed)
Patient was having SOB with exertion, due to recent fall got a Ddimer that was elevated. She has since been feeling better, has no SOB with exertion, has pulse ox at home that has normal stats. She continues to have pain but has documented compression fracture, going to see Dr. Marlou Sa Wednesday.   She would like to avoid the CTA is not needed, ddimer may be due to fall/acute trauma. Since she has no symptoms will recheck DDimer and monitor, if any worsening symptoms will go to ER or proceed with CTA.   Patient is agreeable to the plan.  The patient was advised to call immediately if she has any concerning symptoms in the interval. The patient voices understanding of current treatment options and is in agreement with the current care plan.The patient knows to call the clinic with any problems, questions or concerns or go to the ER if any further progression of symptoms.

## 2020-07-14 NOTE — Telephone Encounter (Signed)
error 

## 2020-07-15 ENCOUNTER — Other Ambulatory Visit: Payer: Self-pay | Admitting: Physician Assistant

## 2020-07-15 ENCOUNTER — Other Ambulatory Visit: Payer: Medicare HMO

## 2020-07-15 ENCOUNTER — Other Ambulatory Visit: Payer: Self-pay

## 2020-07-15 DIAGNOSIS — R0602 Shortness of breath: Secondary | ICD-10-CM | POA: Diagnosis not present

## 2020-07-15 LAB — D-DIMER, QUANTITATIVE: D-Dimer, Quant: 1.37 mcg/mL FEU — ABNORMAL HIGH (ref <0.50)

## 2020-07-16 ENCOUNTER — Encounter: Payer: Self-pay | Admitting: Orthopedic Surgery

## 2020-07-16 ENCOUNTER — Ambulatory Visit: Payer: Medicare HMO | Admitting: Orthopedic Surgery

## 2020-07-16 DIAGNOSIS — M546 Pain in thoracic spine: Secondary | ICD-10-CM

## 2020-07-16 NOTE — Progress Notes (Signed)
Office Visit Note   Patient: Vanessa French           Date of Birth: [birth year]           MRN: [id number] Visit Date: 07/16/2020 Requested by: Unk Pinto, [address] [place],  [place] [address] PCP: Unk Pinto, MD  Subjective: Chief Complaint  Patient presents with  . Back Pain    HPI: Vanessa French is a 69 year old patient with low back pain.  Patient initially had an injury 06/28/2020 where she fell going up the stairs carrying laundry.  Compression fracture noted at that time at the thoracic spine which was fairly minimal.  Developed worsening pain about 2 weeks later.  Repeat radiographs demonstrated significant compression of the lower thoracic vertebral body more so than what she had 2 weeks prior.  Denies any leg symptoms.  Does report pain radiating down the right-hand side.  Takes Tylenol meloxicam and Neurontin.  She still working but not doing too much lifting.              ROS: All systems reviewed are negative as they relate to the chief complaint within the history of present illness.  Patient denies  fevers or chills.   Assessment & Plan: Visit Diagnoses:  1. Pain in thoracic spine     Plan: Impression is thoracic vertebral compression fracture in a patient with a history of osteoporosis who has been on Fosamax but has not taken any recently.  She has some pre-existing kyphosis of the thoracic spine but this has made it worse.  Radiographs are reviewed with the patient and you can see the worsening of the kyphosis in the lower thoracic region due to the compression fracture.  Plan at this time is MRI scan to evaluate the fracture as well as to evaluate any type of cord compression.  She may need either bracing which is always difficult at this level versus kyphoplasty versus vertebroplasty versus surgery.  She is on Miacalcin which is not helping much.  Follow-up after the MRI scan.  Follow-Up Instructions: Return for after MRI.   Orders:    Orders Placed This Encounter  Procedures  . MR Thoracic Spine w/o contrast   No orders of the defined types were placed in this encounter.     Procedures: No procedures performed   Clinical Data: No additional findings.  Objective: Vital Signs: There were no vitals taken for this visit.  Physical Exam:   Constitutional: Patient appears well-developed HEENT:  Head: Normocephalic Eyes:EOM are normal Neck: Normal range of motion Cardiovascular: Normal rate Pulmonary/chest: Effort normal Neurologic: Patient is alert Skin: Skin is warm Psychiatric: Patient has normal mood and affect    Ortho Exam: Ortho exam demonstrates normal gait alignment.  No nerve root tension signs.  5 out of 5 ankle dorsiflexion plantarflexion quad hamstring strength with palpable pedal pulses.  Reflexes symmetric with no clonus negative Babinski.  She does have some tenderness to palpation around the mid to lower thoracic spine.  Mild kyphosis present visually.  No definite paresthesias L1 S1 bilaterally.  Specialty Comments:  No specialty comments available.  Imaging: No results found.   PMFS History: Patient Active Problem List   Diagnosis Date Noted  . Hypertension 12/21/2018  . GERD (gastroesophageal reflux disease) 07/13/2018  . Psoriasis 12/15/2017  . Medication management 12/14/2017  . Osteopenia of multiple sites 12/14/2017  . BMI 26.0-26.9,adult 12/14/2017  . Hyperlipidemia 12/14/2017  . Vitamin D deficiency 12/14/2017  . OAB (overactive  bladder) 11/30/2016  . History of prediabetes 11/30/2016  . Anxiety 11/03/2012   Past Medical History:  Diagnosis Date  . Closed fracture of right proximal humerus 04/07/2012  . Diverticulosis of colon 02/14/2020  . Frequent UTI   . GERD (gastroesophageal reflux disease)   . History of pulmonary embolus (PE) 11/03/2012  . Psoriasis     Family History  Problem Relation Age of Onset  . Heart disease Mother   . Heart disease Maternal  Grandfather   . Heart failure Maternal Grandfather 60  . Colon cancer Father 50  . Colon cancer Maternal Uncle     Past Surgical History:  Procedure Laterality Date  . APPENDECTOMY  1959  . BLADDER SUSPENSION  11/1998  . BREAST CYST ASPIRATION Right 2011  . CATARACT EXTRACTION, BILATERAL Bilateral 2016   Dr. Prudencio Burly  . CYSTOCELE REPAIR  11/10/2018   Dr. Alona Bene, [facility]  . CYSTOCELE REPAIR  11/10/2018   Dr. Alona Bene  . EXTENSOR TENDON OF FOREARM / WRIST REPAIR Left 2010   LEFT ARM  . ORIF HUMERUS FRACTURE  04/07/2012   Procedure: OPEN REDUCTION INTERNAL FIXATION (ORIF) PROXIMAL HUMERUS FRACTURE;  Surgeon: Marybelle Killings, MD;  Location: [facility];  Service: Orthopedics;  Laterality: Right;  Open Reduction Internal Fixation Right Proxmial Humerus  . STERIOD INJECTION  04/07/2012   Procedure: STEROID INJECTION;  Surgeon: Marybelle Killings, MD;  Location: [facility];  Service: Orthopedics;  Laterality: Right;   Right 1st Dorsal Compartment Injection  . TONSILLECTOMY  1962  . URETER REVISION Left 11/3003   COMPLIATION OF BLADDER SLING  . vaginal hysterectomy with unilateral salpingooopherectomy  2000   Social History   Occupational History  . Occupation: Programmer, multimedia: [employer]  Tobacco Use  . Smoking status: Never Smoker  . Smokeless tobacco: Never Used  Vaping Use  . Vaping Use: Never used  Substance and Sexual Activity  . Alcohol use: No  . Drug use: No  . Sexual activity: Yes    Partners: Male    Birth control/protection: Post-menopausal

## 2020-07-17 ENCOUNTER — Other Ambulatory Visit: Payer: Self-pay | Admitting: Adult Health

## 2020-07-17 DIAGNOSIS — Z1231 Encounter for screening mammogram for malignant neoplasm of breast: Secondary | ICD-10-CM

## 2020-07-18 ENCOUNTER — Other Ambulatory Visit: Payer: Self-pay | Admitting: Orthopedic Surgery

## 2020-07-18 DIAGNOSIS — M546 Pain in thoracic spine: Secondary | ICD-10-CM

## 2020-07-24 ENCOUNTER — Telehealth: Payer: Self-pay | Admitting: Orthopedic Surgery

## 2020-07-24 ENCOUNTER — Other Ambulatory Visit: Payer: Medicare HMO

## 2020-07-24 NOTE — Telephone Encounter (Signed)
Called patient left message to return call to schedule an appointment for MRI review with Dr Marlou Sa    MRI scheduled 07/25/2020

## 2020-07-25 ENCOUNTER — Other Ambulatory Visit: Payer: Self-pay

## 2020-07-25 ENCOUNTER — Ambulatory Visit (HOSPITAL_COMMUNITY)
Admission: RE | Admit: 2020-07-25 | Discharge: 2020-07-25 | Disposition: A | Discharge status: Home / Self Care | Payer: Medicare HMO | Source: Ambulatory Visit | Attending: Orthopedic Surgery | Admitting: Orthopedic Surgery

## 2020-07-25 DIAGNOSIS — M47812 Spondylosis without myelopathy or radiculopathy, cervical region: Secondary | ICD-10-CM | POA: Diagnosis not present

## 2020-07-25 DIAGNOSIS — M40204 Unspecified kyphosis, thoracic region: Secondary | ICD-10-CM | POA: Diagnosis not present

## 2020-07-25 DIAGNOSIS — M546 Pain in thoracic spine: Secondary | ICD-10-CM | POA: Diagnosis not present

## 2020-07-25 DIAGNOSIS — S22080A Wedge compression fracture of T11-T12 vertebra, initial encounter for closed fracture: Secondary | ICD-10-CM | POA: Diagnosis not present

## 2020-07-25 DIAGNOSIS — G9589 Other specified diseases of spinal cord: Secondary | ICD-10-CM | POA: Diagnosis not present

## 2020-07-27 ENCOUNTER — Other Ambulatory Visit: Payer: Self-pay | Admitting: Internal Medicine

## 2020-07-27 DIAGNOSIS — R609 Edema, unspecified: Secondary | ICD-10-CM

## 2020-07-29 NOTE — Progress Notes (Signed)
Hi Vanessa French she is got a big problem with her back can you have her see Lorin Mercy today or tomorrow thanks

## 2020-07-30 ENCOUNTER — Ambulatory Visit: Payer: Medicare HMO | Admitting: Orthopaedic Surgery

## 2020-07-30 ENCOUNTER — Encounter: Payer: Self-pay | Admitting: Orthopaedic Surgery

## 2020-07-30 DIAGNOSIS — S22000A Wedge compression fracture of unspecified thoracic vertebra, initial encounter for closed fracture: Secondary | ICD-10-CM

## 2020-07-30 NOTE — Addendum Note (Signed)
Addended by: Robyne Peers on: 07/30/2020 02:40 PM   Modules accepted: Orders

## 2020-07-30 NOTE — Progress Notes (Signed)
Office Visit Note   Patient: Vanessa French           Date of Birth: Oct 17, 1951           MRN: [id number] Visit Date: 07/30/2020              Requested by: Unk Pinto, [address] [place],  [place] [address] PCP: Unk Pinto, MD   Assessment & Plan: Visit Diagnoses:  1. Compression fracture of thoracic vertebra, unspecified thoracic vertebral level, initial encounter ([place])          T1 and T11 new.  Old T3 and T12 fracture.  Plan: Patient's bone density needs to be repeated and she may need to restart treatment.  We discussed avoiding bending or lifting.  She has no cord compression and no long track signs.  I will call her after I review the bone density test.  On return in 1 month AP lateral thoracic spine x-rays.  Follow-Up Instructions: Return in about 1 month (around 08/30/2020).   Orders:  No orders of the defined types were placed in this encounter.  No orders of the defined types were placed in this encounter.     Procedures: No procedures performed   Clinical Data: No additional findings.   Subjective: Chief Complaint  Patient presents with  . Middle Back - Pain    HPI 69 year old female was going up the steps with a basket of clothes in her house where she is left for many years slipped and fell going backwards bending sheet rock on the wall with her feet going up.  She had pain and x-rays 07/01/2020 showed evidence of new T11 fracture and also T1 fracture.  Patient had past history of Previous T12 compression fracture and also chronic T3 compression fracture unchanged from 2009.  T11 by MRI performed on 07/25/2020 showed 30% compression at T1 and T11 25% anterior height loss with moderate extensive marrow edema fluid-filled cavity cleft along the superior endplate 10 mm retropulsion and changes suspicious of nondisplaced bilateral pedicle/pars fractures.  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  She took some Miacalcin which does not seem to really give her any relief.  She has no interest in wearing a brace.  She was on Fosamax took it for many years and has been off it now for almost 2 years.  Last bone density test was 21 months ago done at the breast center.  This showed T score of -1.9 consistent with osteopenia. Review of Systems no history of malignancy.  Positive for osteopenia.  Previous ORIF proximal humerus right shoulder 2013 by me.  Posterior thoracic direct compression fractures as above.   Objective: Vital Signs: BP (!) 143/93   Pulse (!) 105   Physical Exam Constitutional:      Appearance: She is well-developed.  HENT:     Head: Normocephalic.     Right Ear: External ear normal.     Left Ear: External ear normal.  Eyes:     Pupils: Pupils are equal, round, and reactive to light.  Neck:     Thyroid: No thyromegaly.     Trachea: No tracheal deviation.  Cardiovascular:     Rate and Rhythm: Normal rate.  Pulmonary:     Effort: Pulmonary effort is normal.  Abdominal:     Palpations: Abdomen is  soft.  Skin:    General: Skin is warm and dry.  Neurological:     Mental Status: She is alert and oriented to person, place, and time.  Psychiatric:        Behavior: Behavior normal.     Ortho Exam patient is ambulatory with no lower extremity weakness.  She is in her work uniform has been at work today.  Sensation lower extremities is normal. Specialty Comments:  No specialty comments available.  Imaging: No results found.   PMFS History: Patient Active Problem List   Diagnosis Date Noted  . Thoracic compression fracture ([place]) 07/30/2020  . Hypertension 12/21/2018  . GERD (gastroesophageal reflux disease) 07/13/2018  . Psoriasis 12/15/2017  . Medication management 12/14/2017  . Osteopenia of multiple sites 12/14/2017  . BMI 26.0-26.9,adult 12/14/2017  . Hyperlipidemia  12/14/2017  . Vitamin D deficiency 12/14/2017  . OAB (overactive bladder) 11/30/2016  . History of prediabetes 11/30/2016  . Anxiety 11/03/2012   Past Medical History:  Diagnosis Date  . Closed fracture of right proximal humerus 04/07/2012  . Diverticulosis of colon 02/14/2020  . Frequent UTI   . GERD (gastroesophageal reflux disease)   . History of pulmonary embolus (PE) 11/03/2012  . Psoriasis     Family History  Problem Relation Age of Onset  . Heart disease Mother   . Heart disease Maternal Grandfather   . Heart failure Maternal Grandfather 60  . Colon cancer Father 18  . Colon cancer Maternal Uncle     Past Surgical History:  Procedure Laterality Date  . APPENDECTOMY  1959  . BLADDER SUSPENSION  11/1998  . BREAST CYST ASPIRATION Right 2011  . CATARACT EXTRACTION, BILATERAL Bilateral 2016   Dr. Prudencio Burly  . CYSTOCELE REPAIR  11/10/2018   Dr. Alona Bene, [facility]  . CYSTOCELE REPAIR  11/10/2018   Dr. Alona Bene  . EXTENSOR TENDON OF FOREARM / WRIST REPAIR Left 2010   LEFT ARM  . ORIF HUMERUS FRACTURE  04/07/2012   Procedure: OPEN REDUCTION INTERNAL FIXATION (ORIF) PROXIMAL HUMERUS FRACTURE;  Surgeon: Marybelle Killings, MD;  Location: [facility];  Service: Orthopedics;  Laterality: Right;  Open Reduction Internal Fixation Right Proxmial Humerus  . STERIOD INJECTION  04/07/2012   Procedure: STEROID INJECTION;  Surgeon: Marybelle Killings, MD;  Location: [facility];  Service: Orthopedics;  Laterality: Right;   Right 1st Dorsal Compartment Injection  . TONSILLECTOMY  1962  . URETER REVISION Left 11/3003   COMPLIATION OF BLADDER SLING  . vaginal hysterectomy with unilateral salpingooopherectomy  2000   Social History   Occupational History  . Occupation: Programmer, multimedia: [employer]  Tobacco Use  . Smoking status: Never Smoker  . Smokeless tobacco: Never Used  Vaping Use  . Vaping Use: Never used  Substance and Sexual Activity  . Alcohol use: No  . Drug use: No  . Sexual activity: Yes     Partners: Male    Birth control/protection: Post-menopausal

## 2020-08-01 ENCOUNTER — Telehealth: Payer: Self-pay | Admitting: Orthopaedic Surgery

## 2020-08-01 ENCOUNTER — Ambulatory Visit: Payer: Medicare HMO | Admitting: Surgical

## 2020-08-01 NOTE — Telephone Encounter (Signed)
Can you help with this?

## 2020-08-01 NOTE — Telephone Encounter (Signed)
I had told her when she was here the Medicare only covers bone density test every 2 years however she has new thoracic fractures and had been on treatment previously on Fosamax which has been stopped and a new scan is needed to make a decision about treatment.  I placed this on the sheet when I originally wrote this down and whoever works on authorization will have to rely that the patient has 2 new thoracic compression fractures and a change in treatment is necessary hence new bone density test needs to be done now and not wait until January.  Whoever works on bone density authorization can call the patient and discussed this with her.  Patient may not recall, but I did go over this with her at the time of her visit.

## 2020-08-01 NOTE — Telephone Encounter (Signed)
Pt called stating her insurance only covers a bone density test every 2 years and she's not due til Jan. 2022, so she will need a prior autho in order for her insurance to cover it. Pt would like a CB to make sure someone is working on this authorization   [phone]

## 2020-08-04 ENCOUNTER — Encounter: Payer: Medicare HMO | Admitting: Adult Health

## 2020-08-05 ENCOUNTER — Other Ambulatory Visit: Payer: Medicare HMO

## 2020-08-05 NOTE — Telephone Encounter (Signed)
Called pt today and told her what was told to me with her insurance company and she should be ok with getting her exam done as long as the dx says its a new fracture

## 2020-08-05 NOTE — Progress Notes (Signed)
Complete Physical  Assessment and Plan:  Encounter for Annual Complete Physical without Abnormal Findings  Hypertension Continue medication Monitor blood pressure at home; call if consistently over 130/80 Continue DASH diet.   Reminder to go to the ER if any CP, SOB, nausea, dizziness, severe HA, changes vision/speech, left arm numbness and tingling and jaw pain.  OAB (overactive bladder) Improved s/p repair of bladder prolapse.  She tapered off of myrbetriq and feels managing well at this time Wears 1-2 liners/day  Anxiety Well managed by current regimen; continue medications Stress management techniques discussed, increase water, good sleep hygiene discussed, increase exercise, and increase veggies.   Hx of prediabetes Discussed disease and risks of elevated glucose Discussed diet/exercise, weight management  -     Hemoglobin A1c   Overweight Commended her on her excellent progress with 50 lb weight loss in last few years  Recommended diet heavy in fruits and veggies and low in animal meats, cheeses, and dairy products, appropriate calorie intake Discussed appropriate weight for height  Follow up at next visit  Hyperipidemia Mild elevation currently treated by lifestyle only Continue low cholesterol diet and exercise.  -     Lipid panel -     TSH  Vitamin D deficiency -     VITAMIN D 25 Hydroxy (Vit-D Deficiency, Fractures)  Medication management -     CBC with Differential/Platelet -     CMP/GFR -     UA -     magnesium  Osteopenia of multiple sites DEXA due 09/2020 (getting early ASAP due to recent fracture per ortho); continue vit D and calcium supplements Off of fosamax since last check due to improvement to osteopenia range;  Previously on 18 months on 6 months off schedule per ortho recommendation  Psoriasis Previously treated by methotrexate but has been tapered off by derm Dr. Jari Pigg; doing well with topical steroids  GERD Fairly managed on  current medications Discussed diet, avoiding triggers and other lifestyle changes  Thoracic compression fracture ([place]) Get DEXA, Dr. Lorin Mercy following, kyphoplasty not recommended  Need for influenza vaccine High dose quadrivalent administered without complication today   Screening hepatitis C - Hep C antibody   Orders Placed This Encounter  Procedures  . CBC with Differential/Platelet  . COMPLETE METABOLIC PANEL WITH GFR  . Magnesium  . Lipid panel  . TSH  . Hemoglobin A1c  . VITAMIN D 25 Hydroxy (Vit-D Deficiency, Fractures)  . Microalbumin / creatinine urine ratio  . Urinalysis, Routine w reflex microscopic  . Hepatitis C antibody  . Vitamin B12  . EKG 12-Lead   Discussed med's effects and SE's. Screening labs and tests as requested with regular follow-up as recommended. Over 40 minutes of exam, counseling, chart review, and complex, high level critical decision making was performed this visit.   Future Appointments  Date Time Provider [place]  08/06/2020  8:45 AM [name], NP [facility] None  08/29/2020  1:45 PM Marybelle Killings, MD [facility] None  10/31/2020  2:00 PM [facility] DX DEXA 1 [facility]  10/31/2020  2:30 PM [facility] MM 2 [facility]  02/20/2021 11:00 AM [name], NP [facility] None  08/11/2021  9:00 AM [name], NP [facility] None     HPI  BP 110/68   Pulse 64   Temp (!) 97.4 F (36.3 C)   Ht 5\' 4"  (1.626 m)   Wt 159 lb 4.8 oz (72.3 kg)   SpO2 96%   BMI 27.34 kg/m  69 y.o. female  presents for a complete physical and follow up for has Anxiety; OAB (overactive bladder); History of prediabetes; Medication management; Osteopenia of multiple sites; BMI 27.0-27.9,adult; Hyperlipidemia; Vitamin D deficiency; Psoriasis; GERD (gastroesophageal reflux disease); Hypertension; and Thoracic compression fracture (HCC) on their problem list. Hx of provoked PE after humerus fracture without recurrence after tapering off of  coumadin.   She is married, is an Therapist, sports in our office. 3 sons, 2 grandchildren.   She has no concerns today.   She is followed by Dr. Jari Pigg for psoriasis previously on methotrexate, currently off and doing well with topical steroids PRN.   She has hx of osteoporosis, currently off of fosamax since 01/2018 (formerly following 18 month on, 6 month off schedule); taking intermittently since ?2013 since humerus fracture; most recent DEXA in 09/2018 demonstrated L fem T -1.9. She is on vitamin D and calcium supplements. She did have fall down stairs at home in Sept 2021, has been found to have thoracic compression fractures - T1 and T11 new, Old T3 and T12 fracture. Has seen Dr. Lorin Mercy who did not recommend kyphoplasty, will follow up in 1 month.   Has hx of vaginal hysterectomy and had  bladder sling in 2000; had frequent UTIs in the past with cystocele and was referred to Dr. Alona Bene with [facility] and underwent anterior cystocele repair per his recommendation.  Wears a pad (2 during the day while at work). Medications were not helpful.   She has been prescribed zoloft 25 mg daily for many years which she continues to feel is beneficial for mild anxiety. Husband has Parkinson's and somewhat progressing.   she has a diagnosis of GERD which is currently managed by famotidine 20 mg BID she reports symptoms is currently well controlled, and denies breakthrough reflux, burning in chest, hoarseness or cough.    BMI is Body mass index is 27.34 kg/m., she is working on diet and exercise. She successfully lost over 50 lb in the last few years from peak weight 214 lb on 11/30/2016 with phentermine (currently off) and has successfully maintained this since cessation.  She drinks 3 x 16 ounces of water, 1 diet soda, 12 oz coffee Wt Readings from Last 3 Encounters:  08/06/20 159 lb 4.8 oz (72.3 kg)  02/15/20 162 lb 11.2 oz (73.8 kg)  08/03/19 161 lb (73 kg)   Her blood pressure has been controlled at home,  today their BP is BP: 110/68 She does workout. She denies chest pain, shortness of breath, dizziness.   She is not on cholesterol medication and denies myalgias. Her cholesterol is not at goal due to low risk history and mild elevations. The cholesterol last visit was:   Lab Results  Component Value Date   CHOL 204 (H) 02/15/2020   HDL 66 02/15/2020   LDLCALC 123 (H) 02/15/2020   TRIG 63 02/15/2020   CHOLHDL 3.1 02/15/2020   She has been working on diet and exercise for hx of prediabetes, she is on bASA, she is not on ACE/ARB and denies foot ulcerations, increased appetite, nausea, paresthesia of the feet, polydipsia, polyuria, visual disturbances and vomiting. Last A1C in the office was:  Lab Results  Component Value Date   HGBA1C 5.3 08/03/2019   Last GFR: Lab Results  Component Value Date   GFRNONAA 89 07/11/2020   Patient is on Vitamin D supplement, taking 10000 IU daily.   Lab Results  Component Value Date   VD25OH 77 08/03/2019  Current Medications:  Current Outpatient Medications on File Prior to Visit  Medication Sig Dispense Refill  . Ascorbic Acid (VITAMIN C/ROSE HIPS PO) Take 500 mg by mouth daily.    Marland Kitchen aspirin EC 81 MG tablet Take 81 mg by mouth daily.    . calcipotriene (DOVONOX) 0.005 % cream Apply 1 application topically at bedtime.    . calcitonin, salmon, (MIACALCIN) 200 UNIT/ACT nasal spray Place 1 spray into alternate nostrils daily. 3.7 mL 0  . calcium carbonate (TUMS EX) 750 MG chewable tablet Chew 1 tablet by mouth daily.    . cetirizine (ZYRTEC) 10 MG tablet Take 10 mg by mouth as needed for allergies.    . Cholecalciferol (VITAMIN D-3) 5000 UNITS TABS Take 5,000 Units by mouth 2 (two) times daily.    Marland Kitchen CINNAMON PO Take 1,000 mg by mouth daily.    Marland Kitchen CRANBERRY PO Take by mouth. Take 2 caplets BID    . cyclobenzaprine (FLEXERIL) 10 MG tablet Take 1/2 to 1 tablet 3 x /day as needed for Muscle Spasm 90 tablet 1  . famotidine (PEPCID) 20 MG tablet Take  1 tablet 2 x /day for Indigestion & Heartburn 180 tablet 3  . gabapentin (NEURONTIN) 100 MG capsule Take    1 capsule     3 x /day     with Meals for Acute Back Pain 270 capsule 0  . hydrochlorothiazide (HYDRODIURIL) 25 MG tablet Take      1 tablet      Daily      for BP and Fluid Retention / Ankle Swelling 90 tablet 1  . Multiple Vitamin (MULTI-VITAMIN DAILY PO) Take 1 tablet by mouth daily.    . nitrofurantoin, macrocrystal-monohydrate, (MACROBID) 100 MG capsule Take 1 capsule (100 mg total) by mouth 2 (two) times daily. 14 capsule 0  . phentermine (ADIPEX-P) 37.5 MG tablet Take 1/2 to 1 tablet every morning for dieting & weightloss 30 tablet 0  . Probiotic Product (PROBIOTIC DAILY PO) Take by mouth. 1 capsule daily    . sertraline (ZOLOFT) 25 MG tablet Take 1 tablet Daily for Mood 90 tablet 1  . triamcinolone cream (KENALOG) 0.1 % Apply 1 application topically 2 (two) times daily. 80 g 1  . Zinc 50 MG TABS Take by mouth daily.     No current facility-administered medications on file prior to visit.   Allergies:  Allergies  Allergen Reactions  . Penicillins Rash  . Latex Rash   Medical History:  She has Anxiety; OAB (overactive bladder); History of prediabetes; Medication management; Osteopenia of multiple sites; BMI 27.0-27.9,adult; Hyperlipidemia; Vitamin D deficiency; Psoriasis; GERD (gastroesophageal reflux disease); Hypertension; and Thoracic compression fracture ([place]) on their problem list. Health Maintenance:   Immunization History  Administered Date(s) Administered  . Hepatitis B 01/11/2013  . Influenza Split 07/25/2012, 08/12/2014, 08/21/2015  . Influenza, High Dose Seasonal PF 08/02/2017, 07/13/2018, 08/03/2019  . Influenza,inj,quad, With Preservative 08/13/2016  . PFIZER SARS-COV-2 Vaccination 12/23/2019, 01/16/2020  . PPD Test 07/31/2014, 07/28/2015, 08/13/2016, 07/13/2018  . Pneumococcal Conjugate-13 12/15/2017  . Pneumococcal Polysaccharide-23 04/16/2002  . Tdap  12/21/2011  . Zoster 10/26/2003   Preventative care: Tetanus: 2014 Pneumovax: 1996, 2003 Prevnar 13: 2019 Flu vaccine: 07/2019 TODAY  Zostavax: 2005, declines shingrix Covid 19: 2/2, 2021  Pap: S/p hysterectomy, last pelvic 2020  Last colonoscopy: 08/27/2019, 5 year follow up, family history  Mammogram: 09/12/2019, has scheduled in Jan 2022 Dexa: 09/2018 L fem T -1.9, getting ASAP due to recent fracture - per ortho  ECHO: 2014  Names of Other Physician/Practitioners you currently use: 1. [facility] here for primary care 2. Dr. Prudencio Burly, eye doctor, last visit 06/2020 3. Dr. Colin Benton, dentist, last visit 2021, goes q23m 3. Dr. Delman Cheadle, derm, psoriasis, now seeing annually fall 2021   Patient Care Team: Unk Pinto, MD as PCP - General (Internal Medicine) Jari Pigg, MD as Consulting Physician (Dermatology) Marybelle Killings, MD as Consulting Physician (Orthopedic Surgery)  Surgical History:  She has a past surgical history that includes Tonsillectomy (1962); Appendectomy (1959); Bladder suspension (11/1998); Ureter revision (Left, 11/3003); Extensor tendon of forearm / wrist repair (Left, 2010); ORIF humerus fracture (04/07/2012); Steriod injection (04/07/2012); Breast cyst aspiration (Right, 2011); vaginal hysterectomy with unilateral salpingooopherectomy (2000); Cataract extraction, bilateral (Bilateral, 2016); Cystocele repair (11/10/2018); and Cystocele repair (11/10/2018). Family History:  Herfamily history includes Colon cancer in her maternal uncle; Colon cancer (age of onset: 19) in her father; Heart disease in her maternal grandfather and mother; Heart failure (age of onset: 84) in her maternal grandfather. Social History:  She reports that she has never smoked. She has never used smokeless tobacco. She reports that she does not drink alcohol and does not use drugs.  Review of Systems: Review of Systems  Constitutional: Negative for  malaise/fatigue and weight loss.  HENT: Negative for hearing loss and tinnitus.   Eyes: Negative for blurred vision and double vision.  Respiratory: Negative for cough, shortness of breath and wheezing.   Cardiovascular: Negative for chest pain, palpitations, orthopnea, claudication and leg swelling.  Gastrointestinal: Negative for abdominal pain, blood in stool, constipation, diarrhea, heartburn, melena, nausea and vomiting.  Genitourinary: Positive for frequency (chronic). Negative for dysuria, flank pain, hematuria and urgency.  Musculoskeletal: Positive for back pain (thoracic). Negative for joint pain and myalgias.  Skin: Negative for rash.  Neurological: Negative for dizziness, tingling, sensory change, weakness and headaches.  Endo/Heme/Allergies: Negative for polydipsia.  Psychiatric/Behavioral: Negative.   All other systems reviewed and are negative.   Physical Exam: Estimated body mass index is 27.34 kg/m as calculated from the following:   Height as of this encounter: 5\' 4"  (1.626 m).   Weight as of this encounter: 159 lb 4.8 oz (72.3 kg). BP 110/68   Pulse 64   Temp (!) 97.4 F (36.3 C)   Ht 5\' 4"  (1.626 m)   Wt 159 lb 4.8 oz (72.3 kg)   SpO2 96%   BMI 27.34 kg/m  General Appearance: Well nourished, in no apparent distress.  Eyes: PERRLA, EOMs, conjunctiva no swelling or erythema; fundal exam deferred to ophth Sinuses: No Frontal/maxillary tenderness  ENT/Mouth: Ext aud canals clear, normal light reflex with TMs without erythema, bulging. Good dentition. No erythema, swelling, or exudate on post pharynx. Tonsils not swollen or erythematous. Hearing normal.  Neck: Supple, thyroid normal. No bruits  Respiratory: Respiratory effort normal, BS equal bilaterally without rales, rhonchi, wheezing or stridor.  Cardio: RRR without murmurs, rubs or gallops. No carotid or abdominal aortic bruit. Brisk peripheral pulses without edema.  Chest: symmetric, with normal  excursions. Breasts: Patient declines  Abdomen: Soft, nontender, no guarding, rebound, hernias, masses, or organomegaly.  Lymphatics: Non tender without lymphadenopathy.  Genitourinary: Deferred Musculoskeletal: Full ROM all peripheral extremities,5/5 strength, and normal gait. Mildly kyphotic.  Skin: Warm, dry without lesions, ecchymosis.She has erythematous patchy rash under bilateral breasts (has seen derm) Neuro: Cranial nerves intact, reflexes equal bilaterally. Normal muscle tone, no cerebellar symptoms. Sensation intact.  Psych: Awake and oriented X 3, normal  affect, Insight and Judgment appropriate.   EKG: WNL, NSCPT    Gorden Harms Jermon Chalfant 8:25 AM [facility]

## 2020-08-06 ENCOUNTER — Encounter: Payer: Self-pay | Admitting: Adult Health

## 2020-08-06 ENCOUNTER — Ambulatory Visit (INDEPENDENT_AMBULATORY_CARE_PROVIDER_SITE_OTHER): Payer: Medicare HMO | Admitting: Adult Health

## 2020-08-06 ENCOUNTER — Other Ambulatory Visit: Payer: Self-pay

## 2020-08-06 ENCOUNTER — Ambulatory Visit: Payer: Medicare HMO | Admitting: Orthopaedic Surgery

## 2020-08-06 VITALS — BP 110/68 | HR 64 | Temp 97.4°F | Ht 64.0 in | Wt 159.3 lb

## 2020-08-06 DIAGNOSIS — E785 Hyperlipidemia, unspecified: Secondary | ICD-10-CM | POA: Diagnosis not present

## 2020-08-06 DIAGNOSIS — Z6827 Body mass index (BMI) 27.0-27.9, adult: Secondary | ICD-10-CM

## 2020-08-06 DIAGNOSIS — Z0001 Encounter for general adult medical examination with abnormal findings: Secondary | ICD-10-CM

## 2020-08-06 DIAGNOSIS — N3281 Overactive bladder: Secondary | ICD-10-CM

## 2020-08-06 DIAGNOSIS — E559 Vitamin D deficiency, unspecified: Secondary | ICD-10-CM

## 2020-08-06 DIAGNOSIS — Z0000 Encounter for general adult medical examination without abnormal findings: Secondary | ICD-10-CM

## 2020-08-06 DIAGNOSIS — Z79899 Other long term (current) drug therapy: Secondary | ICD-10-CM

## 2020-08-06 DIAGNOSIS — D649 Anemia, unspecified: Secondary | ICD-10-CM

## 2020-08-06 DIAGNOSIS — I10 Essential (primary) hypertension: Secondary | ICD-10-CM | POA: Diagnosis not present

## 2020-08-06 DIAGNOSIS — Z87898 Personal history of other specified conditions: Secondary | ICD-10-CM | POA: Diagnosis not present

## 2020-08-06 DIAGNOSIS — Z1159 Encounter for screening for other viral diseases: Secondary | ICD-10-CM

## 2020-08-06 DIAGNOSIS — Z23 Encounter for immunization: Secondary | ICD-10-CM | POA: Diagnosis not present

## 2020-08-06 DIAGNOSIS — Z1329 Encounter for screening for other suspected endocrine disorder: Secondary | ICD-10-CM | POA: Diagnosis not present

## 2020-08-06 DIAGNOSIS — L409 Psoriasis, unspecified: Secondary | ICD-10-CM

## 2020-08-06 DIAGNOSIS — Z136 Encounter for screening for cardiovascular disorders: Secondary | ICD-10-CM

## 2020-08-06 DIAGNOSIS — S22080A Wedge compression fracture of T11-T12 vertebra, initial encounter for closed fracture: Secondary | ICD-10-CM

## 2020-08-06 DIAGNOSIS — Z1389 Encounter for screening for other disorder: Secondary | ICD-10-CM

## 2020-08-06 DIAGNOSIS — K219 Gastro-esophageal reflux disease without esophagitis: Secondary | ICD-10-CM | POA: Diagnosis not present

## 2020-08-06 DIAGNOSIS — Z131 Encounter for screening for diabetes mellitus: Secondary | ICD-10-CM

## 2020-08-06 DIAGNOSIS — S22000A Wedge compression fracture of unspecified thoracic vertebra, initial encounter for closed fracture: Secondary | ICD-10-CM

## 2020-08-06 DIAGNOSIS — F419 Anxiety disorder, unspecified: Secondary | ICD-10-CM

## 2020-08-06 DIAGNOSIS — M8589 Other specified disorders of bone density and structure, multiple sites: Secondary | ICD-10-CM

## 2020-08-06 MED ORDER — GABAPENTIN 100 MG PO CAPS: ORAL_CAPSULE | ORAL | 0 refills | Status: DC

## 2020-08-06 NOTE — Patient Instructions (Signed)
  Ms. Widener , Thank you for taking time to come for your Annual Wellness Visit. I appreciate your ongoing commitment to your health goals. Please review the following plan we discussed and let me know if I can assist you in the future.   These are the goals we discussed: Goals    . Blood Pressure < 130/80    . LDL CALC < 100    . Weight (lb) < 165 lb (74.8 kg)    . Weights/resistance exercises 2-3x per week       This is a list of the screening recommended for you and due dates:  Health Maintenance  Topic Date Due  .  Hepatitis C: One time screening is recommended by Center for Disease Control  (CDC) for  adults born from [birth year] through 1964.   Never done  . Flu Shot  05/25/2020  . Mammogram  09/11/2021  . Tetanus Vaccine  08/05/2023  . Colon Cancer Screening  08/26/2029  . DEXA scan (bone density measurement)  Completed  . COVID-19 Vaccine  Completed  . Pneumonia vaccines  Discontinued     Know what a healthy weight is for you (roughly BMI <25) and aim to maintain this  Aim for 7+ servings of fruits and vegetables daily  65-80+ fluid ounces of water or unsweet tea for healthy kidneys  Limit to max 1 drink of alcohol per day; avoid smoking/tobacco  Limit animal fats in diet for cholesterol and heart health - choose grass fed whenever available  Avoid highly processed foods, and foods high in saturated/trans fats  Aim for low stress - take time to unwind and care for your mental health  Aim for 150 min of moderate intensity exercise weekly for heart health, and weights twice weekly for bone health  Aim for 7-9 hours of sleep daily    A great goal to work towards is aiming to get in a serving daily of some of the most nutritionally dense foods - G- BOMBS daily

## 2020-08-07 ENCOUNTER — Encounter: Payer: Self-pay | Admitting: Adult Health

## 2020-08-07 DIAGNOSIS — E538 Deficiency of other specified B group vitamins: Secondary | ICD-10-CM | POA: Insufficient documentation

## 2020-08-07 LAB — URINALYSIS, ROUTINE W REFLEX MICROSCOPIC
Bilirubin Urine: NEGATIVE
Glucose, UA: NEGATIVE
Hgb urine dipstick: NEGATIVE
Hyaline Cast: NONE SEEN /LPF
Ketones, ur: NEGATIVE
Nitrite: NEGATIVE
Protein, ur: NEGATIVE
Specific Gravity, Urine: 1.013 (ref 1.001–1.03)
Squamous Epithelial / HPF: NONE SEEN /HPF (ref <=5)
pH: 8 (ref 5.0–8.0)

## 2020-08-07 LAB — COMPLETE METABOLIC PANEL WITH GFR
AG Ratio: 1.3 (calc) (ref 1.0–2.5)
ALT: 8 U/L (ref 6–29)
AST: 12 U/L (ref 10–35)
Albumin: 3.9 g/dL (ref 3.6–5.1)
Alkaline phosphatase (APISO): 105 U/L (ref 37–153)
BUN: 9 mg/dL (ref 7–25)
CO2: 33 mmol/L — ABNORMAL HIGH (ref 20–32)
Calcium: 9.7 mg/dL (ref 8.6–10.4)
Chloride: 99 mmol/L (ref 98–110)
Creat: 0.57 mg/dL (ref 0.50–0.99)
GFR, Est African American: 110 mL/min/{1.73_m2} (ref >=60)
GFR, Est Non African American: 95 mL/min/{1.73_m2} (ref >=60)
Globulin: 3.1 g/dL (calc) (ref 1.9–3.7)
Glucose, Bld: 99 mg/dL (ref 65–99)
Potassium: 5.5 mmol/L — ABNORMAL HIGH (ref 3.5–5.3)
Sodium: 139 mmol/L (ref 135–146)
Total Bilirubin: 0.5 mg/dL (ref 0.2–1.2)
Total Protein: 7 g/dL (ref 6.1–8.1)

## 2020-08-07 LAB — VITAMIN D 25 HYDROXY (VIT D DEFICIENCY, FRACTURES): Vit D, 25-Hydroxy: 98 ng/mL (ref 30–100)

## 2020-08-07 LAB — MICROALBUMIN / CREATININE URINE RATIO
Creatinine, Urine: 66 mg/dL (ref 20–275)
Microalb Creat Ratio: 18 mcg/mg creat (ref <30)
Microalb, Ur: 1.2 mg/dL

## 2020-08-07 LAB — CBC WITH DIFFERENTIAL/PLATELET
Absolute Monocytes: 496 cells/uL (ref 200–950)
Basophils Absolute: 40 cells/uL (ref 0–200)
Basophils Relative: 0.7 %
Eosinophils Absolute: 279 cells/uL (ref 15–500)
Eosinophils Relative: 4.9 %
HCT: 45.3 % — ABNORMAL HIGH (ref 35.0–45.0)
Hemoglobin: 14.6 g/dL (ref 11.7–15.5)
Lymphs Abs: 1186 cells/uL (ref 850–3900)
MCH: 29.9 pg (ref 27.0–33.0)
MCHC: 32.2 g/dL (ref 32.0–36.0)
MCV: 92.6 fL (ref 80.0–100.0)
MPV: 9.4 fL (ref 7.5–12.5)
Monocytes Relative: 8.7 %
Neutro Abs: 3699 cells/uL (ref 1500–7800)
Neutrophils Relative %: 64.9 %
Platelets: 274 10*3/uL (ref 140–400)
RBC: 4.89 10*6/uL (ref 3.80–5.10)
RDW: 12.2 % (ref 11.0–15.0)
Total Lymphocyte: 20.8 %
WBC: 5.7 10*3/uL (ref 3.8–10.8)

## 2020-08-07 LAB — LIPID PANEL
Cholesterol: 174 mg/dL (ref <200)
HDL: 61 mg/dL (ref >=50)
LDL Cholesterol (Calc): 97 mg/dL (calc)
Non-HDL Cholesterol (Calc): 113 mg/dL (calc) (ref <130)
Total CHOL/HDL Ratio: 2.9 (calc) (ref <5.0)
Triglycerides: 74 mg/dL (ref <150)

## 2020-08-07 LAB — VITAMIN B12: Vitamin B-12: 337 pg/mL (ref 200–1100)

## 2020-08-07 LAB — TSH: TSH: 1.17 mIU/L (ref 0.40–4.50)

## 2020-08-07 LAB — HEPATITIS C ANTIBODY
Hepatitis C Ab: NONREACTIVE
SIGNAL TO CUT-OFF: 0.01 (ref <1.00)

## 2020-08-07 LAB — HEMOGLOBIN A1C
Hgb A1c MFr Bld: 5.4 % of total Hgb (ref <5.7)
Mean Plasma Glucose: 108 (calc)
eAG (mmol/L): 6 (calc)

## 2020-08-07 LAB — MAGNESIUM: Magnesium: 2 mg/dL (ref 1.5–2.5)

## 2020-08-11 ENCOUNTER — Other Ambulatory Visit: Payer: Self-pay

## 2020-08-11 ENCOUNTER — Ambulatory Visit
Admission: RE | Admit: 2020-08-11 | Discharge: 2020-08-11 | Disposition: A | Discharge status: Home / Self Care | Payer: Medicare HMO | Source: Ambulatory Visit | Attending: Adult Health | Admitting: Adult Health

## 2020-08-11 DIAGNOSIS — M8589 Other specified disorders of bone density and structure, multiple sites: Secondary | ICD-10-CM | POA: Diagnosis not present

## 2020-08-11 DIAGNOSIS — Z780 Asymptomatic menopausal state: Secondary | ICD-10-CM | POA: Diagnosis not present

## 2020-08-13 ENCOUNTER — Other Ambulatory Visit: Payer: Self-pay | Admitting: Adult Health

## 2020-08-13 MED ORDER — ALENDRONATE SODIUM 70 MG PO TABS: 70.0000 mg | ORAL_TABLET | ORAL | 3 refills | Status: DC

## 2020-08-29 ENCOUNTER — Ambulatory Visit: Payer: Medicare HMO | Admitting: Orthopaedic Surgery

## 2020-09-02 ENCOUNTER — Ambulatory Visit (INDEPENDENT_AMBULATORY_CARE_PROVIDER_SITE_OTHER): Payer: Medicare HMO

## 2020-09-02 ENCOUNTER — Ambulatory Visit: Payer: Medicare HMO | Admitting: Orthopaedic Surgery

## 2020-09-02 ENCOUNTER — Encounter: Payer: Self-pay | Admitting: Orthopaedic Surgery

## 2020-09-02 VITALS — BP 126/73 | HR 104 | Ht 64.0 in | Wt 160.0 lb

## 2020-09-02 DIAGNOSIS — S22000A Wedge compression fracture of unspecified thoracic vertebra, initial encounter for closed fracture: Secondary | ICD-10-CM

## 2020-09-02 DIAGNOSIS — M8589 Other specified disorders of bone density and structure, multiple sites: Secondary | ICD-10-CM | POA: Diagnosis not present

## 2020-09-02 NOTE — Progress Notes (Signed)
Office Visit Note   Patient: Vanessa French           Date of Birth: August 26, 1951           MRN: [id number] Visit Date: 09/02/2020              Requested by: Unk Pinto, [address] [place],  [place] [id number] PCP: Unk Pinto, MD   Assessment & Plan: Visit Diagnoses:  1. Compression fracture of thoracic vertebra, unspecified thoracic vertebral level, initial encounter [facility])     Plan: Patient states her symptoms are mild at this point although she does have problems standing.  She gradually can resume her walking activity.  She started her Fosamax 3 weeks ago and I plan to recheck her in 9 months with lateral and AP thoracic x-ray on return.  Follow-Up Instructions: No follow-ups on file.   Orders:  Orders Placed This Encounter  Procedures  . XR Thoracic Spine 2 View   No orders of the defined types were placed in this encounter.     Procedures: No procedures performed   Clinical Data: No additional findings.   Subjective: Chief Complaint  Patient presents with  . Middle Back - Follow-up, Fracture    HPI 69 year old female returns for follow-up of compression fractures.  She has compression fractures that T3, T1-12.  T11 is no fracture which had 25% height loss with minimal retropulsion seen on MRI scan 07/26/2020.  She is continue to work 4 days a week when she is on her feet she has to sit down after 10 minutes of standing.  She has pain that radiates around the right side following of the 11th rib.  No symptoms on the left side.  No associated bowel or bladder symptoms.  She does not gradually resume to walking activity since her most recent fracture.  She is back on Fosamax and starting an 68-month cycle, skipping 6 months and then repeating bone density is current plan.  Review of Systems updated unchanged from last office visit other than as mentioned HPI.  Previous ORIF humerus fracture which healed well.   Objective: Vital Signs:  BP 126/73   Pulse (!) 104   Ht 5\' 4"  (1.626 m)   Wt 160 lb (72.6 kg)   BMI 27.46 kg/m   Physical Exam Constitutional:      Appearance: She is well-developed.  HENT:     Head: Normocephalic.     Right Ear: External ear normal.     Left Ear: External ear normal.  Eyes:     Pupils: Pupils are equal, round, and reactive to light.  Neck:     Thyroid: No thyromegaly.     Trachea: No tracheal deviation.  Cardiovascular:     Rate and Rhythm: Normal rate.  Pulmonary:     Effort: Pulmonary effort is normal.  Abdominal:     Palpations: Abdomen is soft.  Skin:    General: Skin is warm and dry.  Neurological:     Mental Status: She is alert and oriented to person, place, and time.  Psychiatric:        Behavior: Behavior normal.     Ortho Exam patient has increased kyphosis thoracic region.  Normal heel toe gait she gets from sitting standing comfortably. Specialty Comments:  No specialty comments available.  Imaging: CLINICAL DATA:  Thoracic compression fracture.  Worsening pain.  EXAM: MRI THORACIC SPINE WITHOUT CONTRAST  TECHNIQUE: Multiplanar, multisequence MR imaging of the thoracic spine was  performed. No intravenous contrast was administered.  COMPARISON:  Thoracic spine radiographs 07/01/2020 and MRI 05/03/2008  FINDINGS: Alignment:  Exaggerated thoracic kyphosis.  No listhesis.  Vertebrae: T1 compression fracture involving the superior and inferior endplates with 78% anterior vertebral body height loss, mild marrow edema, and no retropulsion. Chronic T3 compression fracture, unchanged from 2009. Chronic T12 compression fracture with moderate anterior height loss. T11 superior endplate compression fracture with 25% anterior vertebral body height loss, moderately extensive marrow edema, a fluid-filled fracture cleft along the superior endplate, and 2 mm retropulsion of the superior endplate. The T11 fracture involves the posterior vertebral body cortex,  and there is mild marrow edema in the posterior elements with suspected nondisplaced bilateral pedicle/pars fractures.  Cord:  Normal signal.  Paraspinal and other soft tissues: 1.3 cm T2 hyperintense focus in the right hepatic dome, mildly enlarged from 2009 and favored to reflect a benign entity such as a cyst or hemangioma.  Disc levels:  Incompletely evaluated cervical spondylosis with disc bulging/central disc protrusions from C5-6 to C7-T1 resulting in at most mild spinal stenosis at C5-6. Minimal retropulsion of the T11 and T12 superior endplates results in a slight impression on the ventral spinal cord without significant stenosis. No sizable thoracic disc herniation is seen. There is scattered moderate thoracic facet arthrosis without evidence of compressive neural foraminal stenosis.  IMPRESSION: 1. Acute T11 compression fracture with 25% height loss, minimal retropulsion, and possible nondisplaced bilateral pedicle/pars fractures. No associated spinal stenosis. 2. T1 compression fracture with 30% height and mild marrow edema, possibly subacute. 3. Chronic T3 and T12 compression fractures.   Electronically Signed   By: Logan Bores M.D.   On: 07/26/2020 12:20   PMFS History: Patient Active Problem List   Diagnosis Date Noted  . B12 deficiency 08/07/2020  . Thoracic compression fracture ([place]) 07/30/2020  . Hypertension 12/21/2018  . GERD (gastroesophageal reflux disease) 07/13/2018  . Psoriasis 12/15/2017  . Medication management 12/14/2017  . Osteopenia of multiple sites 12/14/2017  . BMI 27.0-27.9,adult 12/14/2017  . Hyperlipidemia 12/14/2017  . Vitamin D deficiency 12/14/2017  . OAB (overactive bladder) 11/30/2016  . History of prediabetes 11/30/2016  . Anxiety 11/03/2012   Past Medical History:  Diagnosis Date  . Closed fracture of right proximal humerus 04/07/2012  . Diverticulosis of colon 02/14/2020  . Frequent UTI   . GERD  (gastroesophageal reflux disease)   . History of pulmonary embolus (PE) 11/03/2012  . Psoriasis   . Thoracic compression fracture ([facility])     Family History  Problem Relation Age of Onset  . Heart disease Mother   . Heart disease Maternal Grandfather   . Heart failure Maternal Grandfather 60  . Colon cancer Father 6  . Atrial fibrillation Sister   . Colon cancer Maternal Uncle     Past Surgical History:  Procedure Laterality Date  . APPENDECTOMY  1959  . BLADDER SUSPENSION  11/1998  . BREAST CYST ASPIRATION Right 2011  . CATARACT EXTRACTION, BILATERAL Bilateral 2016   Dr. Prudencio Burly  . CYSTOCELE REPAIR  11/10/2018   Dr. Alona Bene, [facility]  . CYSTOCELE REPAIR  11/10/2018   Dr. Alona Bene  . EXTENSOR TENDON OF FOREARM / WRIST REPAIR Left 2010   LEFT ARM  . ORIF HUMERUS FRACTURE  04/07/2012   Procedure: OPEN REDUCTION INTERNAL FIXATION (ORIF) PROXIMAL HUMERUS FRACTURE;  Surgeon: Marybelle Killings, MD;  Location: [facility];  Service: Orthopedics;  Laterality: Right;  Open Reduction Internal Fixation Right Proxmial Humerus  . STERIOD  INJECTION  04/07/2012   Procedure: STEROID INJECTION;  Surgeon: Marybelle Killings, MD;  Location: [facility];  Service: Orthopedics;  Laterality: Right;   Right 1st Dorsal Compartment Injection  . TONSILLECTOMY  1962  . URETER REVISION Left 11/3003   COMPLIATION OF BLADDER SLING  . vaginal hysterectomy with unilateral salpingooopherectomy  2000   Social History   Occupational History  . Occupation: Programmer, multimedia: [employer]  Tobacco Use  . Smoking status: Never Smoker  . Smokeless tobacco: Never Used  Vaping Use  . Vaping Use: Never used  Substance and Sexual Activity  . Alcohol use: No  . Drug use: No  . Sexual activity: Yes    Partners: Male    Birth control/protection: Post-menopausal

## 2020-10-07 ENCOUNTER — Other Ambulatory Visit: Payer: Self-pay | Admitting: Internal Medicine

## 2020-10-07 DIAGNOSIS — S22080A Wedge compression fracture of T11-T12 vertebra, initial encounter for closed fracture: Secondary | ICD-10-CM

## 2020-10-17 ENCOUNTER — Other Ambulatory Visit: Payer: Self-pay | Admitting: Internal Medicine

## 2020-10-22 ENCOUNTER — Other Ambulatory Visit: Payer: Self-pay | Admitting: Internal Medicine

## 2020-10-31 ENCOUNTER — Ambulatory Visit
Admission: RE | Admit: 2020-10-31 | Discharge: 2020-10-31 | Disposition: A | Discharge status: Home / Self Care | Payer: Medicare HMO | Source: Ambulatory Visit | Attending: Adult Health | Admitting: Adult Health

## 2020-10-31 ENCOUNTER — Other Ambulatory Visit: Payer: Medicare HMO

## 2020-10-31 ENCOUNTER — Other Ambulatory Visit: Payer: Self-pay

## 2020-10-31 DIAGNOSIS — Z1231 Encounter for screening mammogram for malignant neoplasm of breast: Secondary | ICD-10-CM

## 2020-11-21 DIAGNOSIS — L578 Other skin changes due to chronic exposure to nonionizing radiation: Secondary | ICD-10-CM | POA: Diagnosis not present

## 2020-11-21 DIAGNOSIS — D239 Other benign neoplasm of skin, unspecified: Secondary | ICD-10-CM | POA: Diagnosis not present

## 2020-11-21 DIAGNOSIS — D2271 Melanocytic nevi of right lower limb, including hip: Secondary | ICD-10-CM | POA: Diagnosis not present

## 2020-11-21 DIAGNOSIS — L409 Psoriasis, unspecified: Secondary | ICD-10-CM | POA: Diagnosis not present

## 2020-11-21 DIAGNOSIS — L821 Other seborrheic keratosis: Secondary | ICD-10-CM | POA: Diagnosis not present

## 2021-01-15 ENCOUNTER — Other Ambulatory Visit: Payer: Self-pay | Admitting: Internal Medicine

## 2021-01-15 DIAGNOSIS — R609 Edema, unspecified: Secondary | ICD-10-CM

## 2021-01-15 MED ORDER — FLUCONAZOLE 150 MG PO TABS: ORAL_TABLET | ORAL | 3 refills | Status: DC

## 2021-01-24 ENCOUNTER — Other Ambulatory Visit: Payer: Self-pay | Admitting: Internal Medicine

## 2021-01-24 MED ORDER — DULOXETINE HCL 60 MG PO CPEP: ORAL_CAPSULE | ORAL | 3 refills | Status: DC

## 2021-02-20 ENCOUNTER — Ambulatory Visit: Payer: Medicare HMO | Admitting: Adult Health

## 2021-04-24 ENCOUNTER — Encounter: Payer: Self-pay | Admitting: Adult Health

## 2021-04-24 ENCOUNTER — Ambulatory Visit (INDEPENDENT_AMBULATORY_CARE_PROVIDER_SITE_OTHER): Payer: Medicare HMO | Admitting: Adult Health

## 2021-04-24 ENCOUNTER — Other Ambulatory Visit: Payer: Self-pay

## 2021-04-24 ENCOUNTER — Other Ambulatory Visit: Payer: Self-pay | Admitting: Adult Health

## 2021-04-24 VITALS — BP 110/76 | HR 86 | Temp 97.9°F | Ht 64.0 in | Wt 160.0 lb

## 2021-04-24 DIAGNOSIS — R69 Illness, unspecified: Secondary | ICD-10-CM | POA: Diagnosis not present

## 2021-04-24 DIAGNOSIS — R2681 Unsteadiness on feet: Secondary | ICD-10-CM

## 2021-04-24 DIAGNOSIS — L409 Psoriasis, unspecified: Secondary | ICD-10-CM

## 2021-04-24 DIAGNOSIS — I10 Essential (primary) hypertension: Secondary | ICD-10-CM

## 2021-04-24 DIAGNOSIS — M8589 Other specified disorders of bone density and structure, multiple sites: Secondary | ICD-10-CM | POA: Diagnosis not present

## 2021-04-24 DIAGNOSIS — E538 Deficiency of other specified B group vitamins: Secondary | ICD-10-CM | POA: Diagnosis not present

## 2021-04-24 DIAGNOSIS — M546 Pain in thoracic spine: Secondary | ICD-10-CM

## 2021-04-24 DIAGNOSIS — K219 Gastro-esophageal reflux disease without esophagitis: Secondary | ICD-10-CM

## 2021-04-24 DIAGNOSIS — Z87898 Personal history of other specified conditions: Secondary | ICD-10-CM | POA: Diagnosis not present

## 2021-04-24 DIAGNOSIS — Z79899 Other long term (current) drug therapy: Secondary | ICD-10-CM

## 2021-04-24 DIAGNOSIS — S22080S Wedge compression fracture of T11-T12 vertebra, sequela: Secondary | ICD-10-CM

## 2021-04-24 DIAGNOSIS — E559 Vitamin D deficiency, unspecified: Secondary | ICD-10-CM

## 2021-04-24 DIAGNOSIS — N3281 Overactive bladder: Secondary | ICD-10-CM

## 2021-04-24 DIAGNOSIS — G8929 Other chronic pain: Secondary | ICD-10-CM

## 2021-04-24 DIAGNOSIS — Z0000 Encounter for general adult medical examination without abnormal findings: Secondary | ICD-10-CM

## 2021-04-24 DIAGNOSIS — E785 Hyperlipidemia, unspecified: Secondary | ICD-10-CM | POA: Diagnosis not present

## 2021-04-24 DIAGNOSIS — Z6827 Body mass index (BMI) 27.0-27.9, adult: Secondary | ICD-10-CM | POA: Diagnosis not present

## 2021-04-24 DIAGNOSIS — F419 Anxiety disorder, unspecified: Secondary | ICD-10-CM

## 2021-04-24 MED ORDER — DULOXETINE HCL 30 MG PO CPEP: ORAL_CAPSULE | ORAL | 3 refills | Status: DC

## 2021-04-24 NOTE — Progress Notes (Signed)
MEDICARE  Assessment and Plan:  Annual Medicare Wellness Visit Due annually  Health maintenance reviewed  Hypertension Continue medication Monitor blood pressure at home; call if consistently over 130/80 Continue DASH diet.   Reminder to go to the ER if any CP, SOB, nausea, dizziness, severe HA, changes vision/speech, left arm numbness and tingling and jaw pain.  OAB (overactive bladder) Improved s/p repair of bladder prolapse.  She tapered off of myrbetriq and feels managing well at this time Wears 1-2 liners/day  Anxiety Well managed by current regimen; continue medications;  Will try tapering cymbalta per patient preference. 30 mg caps sent in -  Stress management techniques discussed, increase water, good sleep hygiene discussed, increase exercise, and increase veggies.   Hx of prediabetes Discussed disease and risks of elevated glucose Discussed diet/exercise, weight management  Check A1C annually; monitor serum glucose - CMP   Overweight Commended her on her excellent progress with 50 lb weight loss in last few years  She is maintaining well  Recommended diet heavy in fruits and veggies and low in animal meats, cheeses, and dairy products, appropriate calorie intake Exercise encouraged Follow up at next visit  Hyperipidemia Mild elevation currently well controlled by lifestyle Continue low cholesterol diet and exercise.  -     Lipid panel -     TSH  Vitamin D deficiency -     VITAMIN D 25 Hydroxy (Vit-D Deficiency, Fractures)  Medication management -     CBC with Differential/Platelet -     CMP/GFR -     magnesium  Osteopenia of multiple sites DEXA due 09/2020 as scheduled continue vit D and calcium supplements Back on fosamax since last check due to fracture per Dr. Lorin Mercy Previously on 18 months on 6 months off schedule per ortho recommendation  Psoriasis Previously treated by methotrexate but has been tapered off by derm Dr. Jari Pigg; doing well  with topical steroids  GERD Breakthrough; do PPI daily x 2-6 weeks then taper back to H2i; repeat PRN Discussed diet, avoiding triggers and other lifestyle changes  Thoracic compression fracture ([place]) Get DEXA, Dr. Lorin Mercy following, kyphoplasty not recommended Has been 6+ months  Back pain/unsteady gait/moderate fall risk Kyphosis, several compression fractures following fall and notes intermittently unsteady gait; discussed exercise at length. Will refer to PT for guidance on appropriate exercises to support back with hx of numerous compression fractures as well as fall prevention.   Orders Placed This Encounter  Procedures   CBC with Differential/Platelet   COMPLETE METABOLIC PANEL WITH GFR   Magnesium   Lipid panel   TSH   VITAMIN D 25 Hydroxy (Vit-D Deficiency, Fractures)   Ambulatory referral to Physical Therapy   Discussed med's effects and SE's. Screening labs and tests as requested with regular follow-up as recommended. Over 40 minutes of exam, counseling, chart review, and complex, high level critical decision making was performed this visit.   Future Appointments  Date Time Provider [place]  08/11/2021  9:00 AM [name], NP [facility] None  04/26/2022 11:00 AM [name], NP [facility] None    Plan:   During the course of the visit the patient was educated and counseled about appropriate screening and preventive services including:   Pneumococcal vaccine  Prevnar 13 Influenza vaccine Td vaccine Screening electrocardiogram Bone densitometry screening Colorectal cancer screening Diabetes screening Glaucoma screening Nutrition counseling  Advanced directives: requested    HPI  BP 110/76   Pulse 86   Temp 97.9 F (36.6 C)  Ht 5\' 4"  (1.626 m)   Wt 160 lb (72.6 kg)   SpO2 99%   BMI 27.46 kg/m   70 y.o. female  presents for a complete physical and follow up for has Anxiety; OAB (overactive bladder); History of prediabetes; Medication  management; Osteopenia of multiple sites; BMI 27.0-27.9,adult; Hyperlipidemia; Vitamin D deficiency; Psoriasis; GERD (gastroesophageal reflux disease); Hypertension; Thoracic compression fracture ([place]); and B12 deficiency on their problem list. Hx of provoked PE after humerus fracture without recurrence after tapering off of coumadin.   She is married, 3 sons, 2 grandchildren. 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   Husband has Parkinson's and somewhat progressing.   She is followed by Dr. Jari Pigg for psoriasis previously on methotrexate, currently off and doing well with topical steroids PRN.   She is on vitamin D and calcium supplements. She did have fall down stairs at home in Sept 2021, has been found to have thoracic compression fractures - T1 and T11 new, Old T3 and T12 fracture. Has seen Dr. Lorin Mercy who did not recommend kyphoplasty. She has hx of osteoporosis, currently back on fosamax since fall 2021; taking intermittently since ?2013 since humerus fracture; most recent DEXA in 09/2020 demonstrated L fem T -2.0, alendronate was restarted by Dr. Lorin Mercy.    Has hx of vaginal hysterectomy and had  bladder sling in 2000; had frequent UTIs in the past with cystocele and was referred to Dr. Alona Bene with [facility] and underwent anterior cystocele repair per his recommendation.  Wears a pad (2 during the day while at work). Medications were not helpful.   She has been prescribed zoloft 25 mg daily for many years but was changed to cymbalta 60 for pain with benefit, but notes constipation since this med and would like to reduce.  she has a diagnosis of GERD which is currently managed by famotidine 20 mg BID. She reports has been having intermittent breakthrough, has omeprazole 20 mg that she takes PRN.   BMI is Body mass index is 27.46 kg/m., she is working on diet and exercise. She successfully lost over 50 lb with phentermine and has maintained. She is walking regularly for  exercise.  Wt Readings from Last 3 Encounters:  04/24/21 160 lb (72.6 kg)  09/02/20 160 lb (72.6 kg)  08/06/20 159 lb 4.8 oz (72.3 kg)   Her blood pressure has been controlled at home, today their BP is BP: 110/76 She does workout. She denies chest pain, shortness of breath, dizziness.   She is not on cholesterol medication and denies myalgias. Her cholesterol is not at goal due to low risk history and mild elevations. The cholesterol last visit was:   Lab Results  Component Value Date   CHOL 174 08/06/2020   HDL 61 08/06/2020   LDLCALC 97 08/06/2020   TRIG 74 08/06/2020   CHOLHDL 2.9 08/06/2020   She has been working on diet and exercise for hx of prediabetes, she is on bASA, she is not on ACE/ARB and denies foot ulcerations, increased appetite, nausea, paresthesia of the feet, polydipsia, polyuria, visual disturbances and vomiting. Last A1C in the office was:  Lab Results  Component Value Date   HGBA1C 5.4 08/06/2020   Last GFR: Lab Results  Component Value Date   GFRNONAA 95 08/06/2020   Patient is on Vitamin D supplement, taking 10000 IU daily.   Lab Results  Component Value Date   VD25OH 98 08/06/2020       Current Medications:  Current Outpatient Medications on File Prior to Visit  Medication Sig Dispense Refill   alendronate (FOSAMAX) 70 MG tablet Take 1 tablet (70 mg total) by mouth every 7 (seven) days. Take with a full glass of water on an empty stomach. 12 tablet 3   Ascorbic Acid (VITAMIN C/ROSE HIPS PO) Take 500 mg by mouth daily.     aspirin EC 81 MG tablet Take 81 mg by mouth daily.     calcipotriene (DOVONOX) 0.005 % cream Apply 1 application topically at bedtime.     calcium carbonate (TUMS EX) 750 MG chewable tablet Chew 1 tablet by mouth daily.     cetirizine (ZYRTEC) 10 MG tablet Take 10 mg by mouth as needed for allergies.     Cholecalciferol (VITAMIN D-3) 5000 UNITS TABS Take 5,000 Units by mouth 2 (two) times daily.     CINNAMON PO Take 1,000 mg by  mouth daily.     CRANBERRY PO Take by mouth. Take 2 caplets BID     famotidine (PEPCID) 20 MG tablet TAKE ONE TABLET BY MOUTH TWICE A DAY FOR INDIGESTION AND HEARTBURN 180 tablet 1   hydrochlorothiazide (HYDRODIURIL) 25 MG tablet TAKE ONE TABLET BY MOUTH DAILY FOR BLOOD PRESSURE AND FLUID RETENTION/ ANKLE SWELLING 90 tablet 1   Multiple Vitamin (MULTI-VITAMIN DAILY PO) Take 1 tablet by mouth daily.     triamcinolone cream (KENALOG) 0.1 % Apply 1 application topically 2 (two) times daily. 80 g 1   No current facility-administered medications on file prior to visit.   Allergies:  Allergies  Allergen Reactions   Penicillins Rash   Latex Rash   Medical History:  She has Anxiety; OAB (overactive bladder); History of prediabetes; Medication management; Osteopenia of multiple sites; BMI 27.0-27.9,adult; Hyperlipidemia; Vitamin D deficiency; Psoriasis; GERD (gastroesophageal reflux disease); Hypertension; Thoracic compression fracture ([place]); and B12 deficiency on their problem list. Health Maintenance:   Immunization History  Administered Date(s) Administered   Hepatitis B 01/11/2013   Influenza Split 07/25/2012, 08/12/2014, 08/21/2015   Influenza, High Dose Seasonal PF 08/02/2017, 07/13/2018, 08/03/2019, 08/06/2020   Influenza,inj,quad, With Preservative 08/13/2016   PFIZER(Purple Top)SARS-COV-2 Vaccination 12/23/2019, 01/16/2020, 09/20/2020   PPD Test 07/31/2014, 07/28/2015, 08/13/2016, 07/13/2018   Pneumococcal Conjugate-13 12/15/2017   Pneumococcal Polysaccharide-23 04/16/2002   Tdap 12/21/2011   Zoster, Live 10/26/2003   Preventative care: Tetanus: 2014 Pneumovax: 1996, 2003 Prevnar 13: 2019 Flu vaccine: 2021 Zostavax: 2005, declines shingrix Covid 19: 2/2, 2021 + 1 booster  Pap: S/p hysterectomy, last pelvic 2020 [facility] DONE.  Last colonoscopy: 08/27/2019, 5 year follow up, family history  Mammogram: 10/2020 Dexa: 07/2020 T-2.0 radius, Dr. Lorin Mercy restarted  alendronate due to fractures  Names of Other Physician/Practitioners you currently use: 1. [facility] here for primary care 2. Dr. Prudencio Burly, eye doctor, last visit 06/2020, has scheduled 05/2021 3. Dr. Barrie Dunker, dentist, last visit 04/2021, goes q34m 3. Dr. Delman Cheadle, derm, psoriasis, now seeing annually 10/2020  Patient Care Team: Unk Pinto, MD as PCP - General (Internal Medicine) Jari Pigg, MD as Consulting Physician (Dermatology) Marybelle Killings, MD as Consulting Physician (Orthopedic Surgery)  Surgical History:  She has a past surgical history that includes Tonsillectomy (1962); Appendectomy (1959); Bladder suspension (11/1998); Ureter revision (Left, 11/3003); Extensor tendon of forearm / wrist repair (Left, 2010); ORIF humerus fracture (04/07/2012); Steriod injection (04/07/2012); Breast cyst aspiration (Right, 2011); vaginal hysterectomy with unilateral salpingooopherectomy (2000); Cataract extraction, bilateral (Bilateral, 2016); Cystocele repair (11/10/2018); and Cystocele repair (11/10/2018). Family  History:  Herfamily history includes Atrial fibrillation in her sister; Colon cancer in her maternal uncle; Colon cancer (age of onset: 41) in her father; Heart disease in her maternal grandfather and mother; Heart failure (age of onset: 45) in her maternal grandfather. Social History:  She reports that she has never smoked. She has never used smokeless tobacco. She reports that she does not drink alcohol and does not use drugs.  MEDICARE WELLNESS OBJECTIVES: Physical activity: Current Exercise Habits: Home exercise routine, Type of exercise: walking, Time (Minutes): 25, Frequency (Times/Week): 5, Weekly Exercise (Minutes/Week): 125, Intensity: Mild, Exercise limited by: None identified Cardiac risk factors: Cardiac Risk Factors include: advanced age (>34men, >67 women);dyslipidemia;hypertension Depression/mood screen:   Depression screen [facility] 2/9 04/24/2021   Decreased Interest 0  Down, Depressed, Hopeless 0  PHQ - 2 Score 0    ADLs:  In your present state of health, do you have any difficulty performing the following activities: 04/24/2021  Hearing? N  Vision? N  Difficulty concentrating or making decisions? N  Walking or climbing stairs? N  Dressing or bathing? N  Doing errands, shopping? N  Some recent data might be hidden     Cognitive Testing  Alert? Yes  Normal Appearance?Yes  Oriented to person? Yes  Place? Yes   Time? Yes  Recall of three objects?  Yes  Can perform simple calculations? Yes  Displays appropriate judgment?Yes  Can read the correct time from a watch face?Yes  EOL planning: Does Patient Have a Medical Advance Directive?: Yes Type of Advance Directive: Healthcare Power of Attorney, Living will Does patient want to make changes to medical advance directive?: No - Patient declined Copy of [place] in Chart?: No - copy requested     Review of Systems: Review of Systems  Constitutional:  Negative for malaise/fatigue and weight loss.  HENT:  Negative for hearing loss and tinnitus.   Eyes:  Negative for blurred vision and double vision.  Respiratory:  Negative for cough, shortness of breath and wheezing.   Cardiovascular:  Negative for chest pain, palpitations, orthopnea, claudication and leg swelling.  Gastrointestinal:  Negative for abdominal pain, blood in stool, constipation, diarrhea, heartburn, melena, nausea and vomiting.  Genitourinary:  Positive for frequency (chronic). Negative for dysuria, flank pain, hematuria and urgency.  Musculoskeletal:  Positive for back pain (thoracic). Negative for joint pain and myalgias.  Skin:  Negative for rash.  Neurological:  Negative for dizziness, tingling, sensory change, weakness and headaches.  Endo/Heme/Allergies:  Negative for polydipsia.  Psychiatric/Behavioral: Negative.    All other systems reviewed and are negative.  Physical  Exam: Estimated body mass index is 27.46 kg/m as calculated from the following:   Height as of this encounter: 5\' 4"  (1.626 m).   Weight as of this encounter: 160 lb (72.6 kg). BP 110/76   Pulse 86   Temp 97.9 F (36.6 C)   Ht 5\' 4"  (1.626 m)   Wt 160 lb (72.6 kg)   SpO2 99%   BMI 27.46 kg/m  General Appearance: Well nourished, in no apparent distress.  Eyes: PERRLA, EOMs, conjunctiva no swelling or erythema; fundal exam deferred to ophth Sinuses: No Frontal/maxillary tenderness  ENT/Mouth: Ext aud canals clear, normal light reflex with TMs without erythema, bulging. Good dentition. No erythema, swelling, or exudate on post pharynx. Tonsils not swollen or erythematous. Hearing normal.  Neck: Supple, thyroid normal. No bruits  Respiratory: Respiratory effort normal, BS equal bilaterally without rales, rhonchi, wheezing or stridor.  Cardio: RRR  without murmurs, rubs or gallops. No carotid or abdominal aortic bruit. Brisk peripheral pulses without edema.  Chest: symmetric, with normal excursions. Breasts: Patient declines  Abdomen: Soft, nontender, no guarding, rebound, hernias, masses, or organomegaly.  Lymphatics: Non tender without lymphadenopathy.  Genitourinary: Deferred Musculoskeletal: Full ROM all peripheral extremities,5/5 strength, and normal gait. Mild/mod kyphosis, irregular but non-tender spinous processes.  Skin: Warm, dry without lesions, ecchymosis.She has erythematous patchy rash under bilateral breasts (has seen derm).  Neuro: Cranial nerves intact, reflexes equal bilaterally. Normal muscle tone, no cerebellar symptoms. Sensation intact.  Psych: Awake and oriented X 3, normal affect, Insight and Judgment appropriate.    Medicare Attestation I have personally reviewed: The patient's medical and social history Their use of alcohol, tobacco or illicit drugs Their current medications and supplements The patient's functional ability including ADLs,fall risks, home  safety risks, cognitive, and hearing and visual impairment Diet and physical activities Evidence for depression or mood disorders  The patient's weight, height, BMI, and visual acuity have been recorded in the chart.  I have made referrals, counseling, and provided education to the patient based on review of the above and I have provided the patient with a written personalized care plan for preventive services.      Izora Ribas 2:41 PM [facility]

## 2021-04-25 ENCOUNTER — Other Ambulatory Visit: Payer: Self-pay | Admitting: Adult Health

## 2021-04-25 LAB — LIPID PANEL
Cholesterol: 184 mg/dL (ref <200)
HDL: 66 mg/dL (ref >=50)
LDL Cholesterol (Calc): 103 mg/dL (calc) — ABNORMAL HIGH
Non-HDL Cholesterol (Calc): 118 mg/dL (calc) (ref <130)
Total CHOL/HDL Ratio: 2.8 (calc) (ref <5.0)
Triglycerides: 60 mg/dL (ref <150)

## 2021-04-25 LAB — CBC WITH DIFFERENTIAL/PLATELET
Absolute Monocytes: 608 cells/uL (ref 200–950)
Basophils Absolute: 49 cells/uL (ref 0–200)
Basophils Relative: 0.6 %
Eosinophils Absolute: 81 cells/uL (ref 15–500)
Eosinophils Relative: 1 %
HCT: 45 % (ref 35.0–45.0)
Hemoglobin: 15.3 g/dL (ref 11.7–15.5)
Lymphs Abs: 1693 cells/uL (ref 850–3900)
MCH: 31.3 pg (ref 27.0–33.0)
MCHC: 34 g/dL (ref 32.0–36.0)
MCV: 92 fL (ref 80.0–100.0)
MPV: 9.7 fL (ref 7.5–12.5)
Monocytes Relative: 7.5 %
Neutro Abs: 5670 cells/uL (ref 1500–7800)
Neutrophils Relative %: 70 %
Platelets: 212 10*3/uL (ref 140–400)
RBC: 4.89 10*6/uL (ref 3.80–5.10)
RDW: 12.3 % (ref 11.0–15.0)
Total Lymphocyte: 20.9 %
WBC: 8.1 10*3/uL (ref 3.8–10.8)

## 2021-04-25 LAB — COMPLETE METABOLIC PANEL WITH GFR
AG Ratio: 1.3 (calc) (ref 1.0–2.5)
ALT: 10 U/L (ref 6–29)
AST: 12 U/L (ref 10–35)
Albumin: 4 g/dL (ref 3.6–5.1)
Alkaline phosphatase (APISO): 54 U/L (ref 37–153)
BUN: 11 mg/dL (ref 7–25)
CO2: 29 mmol/L (ref 20–32)
Calcium: 9.2 mg/dL (ref 8.6–10.4)
Chloride: 98 mmol/L (ref 98–110)
Creat: 0.52 mg/dL (ref 0.50–0.99)
GFR, Est African American: 113 mL/min/{1.73_m2} (ref >=60)
GFR, Est Non African American: 97 mL/min/{1.73_m2} (ref >=60)
Globulin: 3 g/dL (calc) (ref 1.9–3.7)
Glucose, Bld: 92 mg/dL (ref 65–99)
Potassium: 4.1 mmol/L (ref 3.5–5.3)
Sodium: 135 mmol/L (ref 135–146)
Total Bilirubin: 0.6 mg/dL (ref 0.2–1.2)
Total Protein: 7 g/dL (ref 6.1–8.1)

## 2021-04-25 LAB — VITAMIN D 25 HYDROXY (VIT D DEFICIENCY, FRACTURES): Vit D, 25-Hydroxy: 112 ng/mL — ABNORMAL HIGH (ref 30–100)

## 2021-04-25 LAB — MAGNESIUM: Magnesium: 2.1 mg/dL (ref 1.5–2.5)

## 2021-04-25 LAB — TSH: TSH: 1.34 mIU/L (ref 0.40–4.50)

## 2021-04-25 MED ORDER — VITAMIN D-3 125 MCG (5000 UT) PO TABS: ORAL_TABLET | ORAL | 0 refills | Status: DC

## 2021-05-05 ENCOUNTER — Other Ambulatory Visit: Payer: Self-pay | Admitting: Internal Medicine

## 2021-05-05 MED ORDER — BENZONATATE 200 MG PO CAPS: ORAL_CAPSULE | ORAL | 1 refills | Status: DC

## 2021-05-05 MED ORDER — DEXAMETHASONE 4 MG PO TABS: ORAL_TABLET | ORAL | 0 refills | Status: DC

## 2021-05-13 ENCOUNTER — Other Ambulatory Visit: Payer: Self-pay | Admitting: Internal Medicine

## 2021-05-13 ENCOUNTER — Encounter: Payer: Self-pay | Admitting: Adult Health

## 2021-05-13 ENCOUNTER — Other Ambulatory Visit: Payer: Self-pay

## 2021-05-13 ENCOUNTER — Ambulatory Visit (INDEPENDENT_AMBULATORY_CARE_PROVIDER_SITE_OTHER): Payer: Medicare HMO | Admitting: Adult Health

## 2021-05-13 VITALS — BP 90/62 | HR 116 | Temp 97.5°F | Wt 158.0 lb

## 2021-05-13 DIAGNOSIS — R000 Tachycardia, unspecified: Secondary | ICD-10-CM | POA: Diagnosis not present

## 2021-05-13 DIAGNOSIS — U071 COVID-19: Secondary | ICD-10-CM | POA: Diagnosis not present

## 2021-05-13 DIAGNOSIS — Z86711 Personal history of pulmonary embolism: Secondary | ICD-10-CM | POA: Diagnosis not present

## 2021-05-13 DIAGNOSIS — I2699 Other pulmonary embolism without acute cor pulmonale: Secondary | ICD-10-CM

## 2021-05-13 MED ORDER — RIVAROXABAN (XARELTO) VTE STARTER PACK (15 & 20 MG): ORAL_TABLET | ORAL | 0 refills | Status: DC

## 2021-05-13 MED ORDER — FLUCONAZOLE 150 MG PO TABS: ORAL_TABLET | ORAL | 0 refills | Status: DC

## 2021-05-13 NOTE — Progress Notes (Signed)
Assessment and Plan:  Vanessa French was seen today for tachycardia.  Diagnoses and all orders for this visit:  Tachycardia Recent covid 19 History of PE - provoked  Main concern is r/o PE following recent covid 19 with hx of provoked Discussed referral to ED or STAT CTA chest today - she declines at this time, receptive to STAT D-dimer and strong ED precautions EKG - sinus tachy, no strain/ST changes - exam with ? 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  -     CBC with Differential/Platelet -     BASIC METABOLIC PANEL WITH GFR -     TSH -     Urinalysis w microscopic + reflex cultur -     D-dimer, quantitative -     EKG 12-Lead  Further disposition pending results of labs. Discussed med's effects and SE's.   Over 30 minutes of exam, counseling, chart review, and critical decision making was performed.   Future Appointments  Date Time Provider [place]  08/11/2021  9:00 AM [name], NP [facility] None  04/26/2022 11:00 AM [name], NP [facility] None    ------------------------------------------------------------------------------------------------------------------   HPI BP 90/62   Pulse (!) 116   Temp (!) 97.5 F (36.4 C)   Wt 158 lb (71.7 kg)   SpO2 97%   BMI 27.12 kg/m  70 y.o.female with htn, hx of proviked PE (following humerous fracture/surgery) and recent covid 19 presents for evaluation of tachycardia.   She was found + for covid 19 11 days ago on 05/02/2021. She reports has essentially resolved, other than noted 4 days ago had rapid pulse around  130 with mild house work (denies CP, dyspnea, wheezing, palpitations, edema, tightness, dizziness). Tachycardia noted only after mild exertion, doesn't note intermittently if just sitting. No fever since initially. She notes more easily fatigued.   She has been drinking normal amount - water, ginger ale, normal/clear urine.  Denies any blood/black stool, GI pain, diarrhea, n/v.    Her blood pressure has been controlled at home, running lower than typical with usual HCTZ 25 mg daily, today their BP is BP: 90/62 She denies chest pain, shortness of breath, dizziness.    Past Medical History:  Diagnosis Date   Closed fracture of right proximal humerus 04/07/2012   Diverticulosis of colon 02/14/2020   Frequent UTI    GERD (gastroesophageal reflux disease)    History of pulmonary embolus (PE) 11/03/2012   Psoriasis    Thoracic compression fracture ([facility])      Allergies  Allergen Reactions   Penicillins Rash   Latex Rash    Current Outpatient Medications on File Prior to Visit  Medication Sig   alendronate (FOSAMAX) 70 MG tablet Take 1 tablet (70 mg total) by mouth every 7 (seven) days. Take with a full glass of water on an empty stomach.   Ascorbic Acid (VITAMIN C/ROSE HIPS PO) Take 500 mg by mouth daily.   aspirin EC 81 MG tablet Take 81 mg by mouth daily.   benzonatate (TESSALON) 200 MG capsule Take 1 perle 3 x / day to prevent cough (Patient  taking differently: Take 1 perle 3 x / day to prevent cough prn)   calcipotriene (DOVONOX) 0.005 % cream Apply 1 application topically at bedtime.   calcium carbonate (TUMS EX) 750 MG chewable tablet Chew 1 tablet by mouth daily.   cetirizine (ZYRTEC) 10 MG tablet Take 10 mg by mouth as needed for allergies.   Cholecalciferol (VITAMIN D-3) 125 MCG (5000 UT) TABS Alternate taking 1 and 2 caps every other day.   CINNAMON PO Take 1,000 mg by mouth daily.   CRANBERRY PO Take by mouth. Take 2 caplets BID   DULoxetine (CYMBALTA) 30 MG capsule Take  1  capsule  Daily  for Mood & Chronic Back Pain   famotidine (PEPCID) 20 MG tablet TAKE ONE TABLET BY MOUTH TWICE A DAY FOR INDIGESTION AND HEARTBURN   hydrochlorothiazide (HYDRODIURIL) 25 MG tablet TAKE ONE TABLET BY MOUTH DAILY FOR BLOOD PRESSURE AND FLUID RETENTION/ ANKLE SWELLING   Multiple Vitamin (MULTI-VITAMIN DAILY PO) Take 1 tablet by mouth daily.   omeprazole (PRILOSEC) 20 MG capsule Take 20 mg by mouth at bedtime.   triamcinolone cream (KENALOG) 0.1 % Apply 1 application topically 2 (two) times daily.   dexamethasone (DECADRON) 4 MG tablet Take 1 tab 3 x day - 3 days, then 2 x day - 3 days, then 1 tab daily   No current facility-administered medications on file prior to visit.    ROS: all negative except above.   Physical Exam:  BP 90/62   Pulse (!) 116   Temp (!) 97.5 F (36.4 C)   Wt 158 lb (71.7 kg)   SpO2 97%   BMI 27.12 kg/m   General Appearance: Well nourished, non-toxic appearing, no apparent distress. Eyes: PERRLA, EOMs, conjunctiva no swelling or erythema Sinuses: No Frontal/maxillary tenderness ENT/Mouth: Ext aud canals clear, TMs without erythema, bulging. No erythema, swelling, or exudate on post pharynx.  Tonsils not swollen or erythematous. Hearing normal.  Neck: Supple, thyroid normal.  Respiratory: Respiratory effort normal, BS equal bilaterally without rales, rhonchi, wheezing or stridor.  Cardio: Rapid rate with occasional skipped beats, no murmurs, gallop, clicks. Brisk peripheral pulses without edema.  Abdomen: Soft, + BS.  Non tender, no guarding, rebound, hernias, masses. Lymphatics: Non tender without lymphadenopathy.  Musculoskeletal: normal gait.  Skin: Warm, dry without rashes, lesions, ecchymosis.  Neuro: Normal muscle tone, no cerebellar symptoms.  Psych: Awake and oriented X 3, normal affect, Insight and Judgment appropriate.     Izora Ribas, NP 4:00 PM [facility]

## 2021-05-13 NOTE — Progress Notes (Signed)
    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 Last 4 days has been having tachycardia ~ 130 .  D-Dimer = 6.13   (Nl< 0.5)    1. Pulmonary embolism associated with COVID-19 ([place])  - RIVAROXABAN (XARELTO) VTE STARTER PACK (15 & 20 MG);   Dispense: 51 each  Take one 15mg  tablet  twice a day for 21 days.   On day 22, switch to one 20mg  tablet once a day.   Take with food.

## 2021-05-14 ENCOUNTER — Encounter: Payer: Self-pay | Admitting: Adult Health

## 2021-05-14 ENCOUNTER — Ambulatory Visit (HOSPITAL_COMMUNITY)
Admission: RE | Admit: 2021-05-14 | Discharge: 2021-05-14 | Disposition: A | Discharge status: Home / Self Care | Payer: Medicare HMO | Source: Ambulatory Visit | Attending: Adult Health | Admitting: Adult Health

## 2021-05-14 ENCOUNTER — Other Ambulatory Visit: Payer: Self-pay | Admitting: Adult Health

## 2021-05-14 DIAGNOSIS — R7989 Other specified abnormal findings of blood chemistry: Secondary | ICD-10-CM

## 2021-05-14 DIAGNOSIS — R000 Tachycardia, unspecified: Secondary | ICD-10-CM

## 2021-05-14 DIAGNOSIS — E041 Nontoxic single thyroid nodule: Secondary | ICD-10-CM | POA: Diagnosis not present

## 2021-05-14 DIAGNOSIS — I2699 Other pulmonary embolism without acute cor pulmonale: Secondary | ICD-10-CM

## 2021-05-14 DIAGNOSIS — R5383 Other fatigue: Secondary | ICD-10-CM | POA: Diagnosis not present

## 2021-05-14 DIAGNOSIS — K449 Diaphragmatic hernia without obstruction or gangrene: Secondary | ICD-10-CM | POA: Diagnosis not present

## 2021-05-14 DIAGNOSIS — I700 Atherosclerosis of aorta: Secondary | ICD-10-CM | POA: Insufficient documentation

## 2021-05-14 HISTORY — DX: Other pulmonary embolism without acute cor pulmonale: I26.99

## 2021-05-14 MED ORDER — IOHEXOL 350 MG/ML SOLN
100.0000 mL | Freq: Once | INTRAVENOUS | Status: AC | PRN
  Administered 2021-05-14: 68 mL via INTRAVENOUS

## 2021-05-16 ENCOUNTER — Other Ambulatory Visit: Payer: Self-pay | Admitting: Internal Medicine

## 2021-05-16 LAB — BASIC METABOLIC PANEL WITH GFR
BUN: 15 mg/dL (ref 7–25)
CO2: 29 mmol/L (ref 20–32)
Calcium: 9.2 mg/dL (ref 8.6–10.4)
Chloride: 98 mmol/L (ref 98–110)
Creat: 0.63 mg/dL (ref 0.50–1.05)
Glucose, Bld: 118 mg/dL — ABNORMAL HIGH (ref 65–99)
Potassium: 3.8 mmol/L (ref 3.5–5.3)
Sodium: 135 mmol/L (ref 135–146)
eGFR: 96 mL/min/{1.73_m2} (ref >=60)

## 2021-05-16 LAB — CBC WITH DIFFERENTIAL/PLATELET
Absolute Monocytes: 1027 cells/uL — ABNORMAL HIGH (ref 200–950)
Basophils Absolute: 43 cells/uL (ref 0–200)
Basophils Relative: 0.4 %
Eosinophils Absolute: 193 cells/uL (ref 15–500)
Eosinophils Relative: 1.8 %
HCT: 49.7 % — ABNORMAL HIGH (ref 35.0–45.0)
Hemoglobin: 16.4 g/dL — ABNORMAL HIGH (ref 11.7–15.5)
Lymphs Abs: 2622 cells/uL (ref 850–3900)
MCH: 30.4 pg (ref 27.0–33.0)
MCHC: 33 g/dL (ref 32.0–36.0)
MCV: 92 fL (ref 80.0–100.0)
MPV: 9.6 fL (ref 7.5–12.5)
Monocytes Relative: 9.6 %
Neutro Abs: 6816 cells/uL (ref 1500–7800)
Neutrophils Relative %: 63.7 %
Platelets: 239 10*3/uL (ref 140–400)
RBC: 5.4 10*6/uL — ABNORMAL HIGH (ref 3.80–5.10)
RDW: 12.3 % (ref 11.0–15.0)
Total Lymphocyte: 24.5 %
WBC: 10.7 10*3/uL (ref 3.8–10.8)

## 2021-05-16 LAB — URINALYSIS W MICROSCOPIC + REFLEX CULTURE
Bacteria, UA: NONE SEEN /HPF
Bilirubin Urine: NEGATIVE
Glucose, UA: NEGATIVE
Hgb urine dipstick: NEGATIVE
Hyaline Cast: NONE SEEN /LPF
Ketones, ur: NEGATIVE
Nitrites, Initial: NEGATIVE
Protein, ur: NEGATIVE
Specific Gravity, Urine: 1.012 (ref 1.001–1.035)
WBC, UA: >=60 /HPF — AB (ref 0–5)
pH: 6.5 (ref 5.0–8.0)

## 2021-05-16 LAB — URINE CULTURE
MICRO NUMBER:: 12141915
SPECIMEN QUALITY:: ADEQUATE

## 2021-05-16 LAB — D-DIMER, QUANTITATIVE: D-Dimer, Quant: 6.13 mcg/mL FEU — ABNORMAL HIGH (ref <0.50)

## 2021-05-16 LAB — CULTURE INDICATED

## 2021-05-16 LAB — TSH: TSH: 1.32 mIU/L (ref 0.40–4.50)

## 2021-05-16 MED ORDER — NITROFURANTOIN MONOHYD MACRO 100 MG PO CAPS: 100.0000 mg | ORAL_CAPSULE | Freq: Two times a day (BID) | ORAL | 0 refills | Status: AC

## 2021-05-30 ENCOUNTER — Other Ambulatory Visit: Payer: Self-pay | Admitting: Internal Medicine

## 2021-05-30 MED ORDER — PREDNISONE 10 MG PO TABS: ORAL_TABLET | ORAL | 0 refills | Status: DC

## 2021-06-06 ENCOUNTER — Other Ambulatory Visit: Payer: Self-pay | Admitting: Internal Medicine

## 2021-06-06 DIAGNOSIS — I2699 Other pulmonary embolism without acute cor pulmonale: Secondary | ICD-10-CM

## 2021-06-06 DIAGNOSIS — U071 COVID-19: Secondary | ICD-10-CM

## 2021-06-06 MED ORDER — RIVAROXABAN 20 MG PO TABS: ORAL_TABLET | ORAL | 1 refills | Status: DC

## 2021-06-09 ENCOUNTER — Encounter: Payer: Self-pay | Admitting: Adult Health

## 2021-06-09 ENCOUNTER — Other Ambulatory Visit: Payer: Self-pay

## 2021-06-09 ENCOUNTER — Ambulatory Visit (INDEPENDENT_AMBULATORY_CARE_PROVIDER_SITE_OTHER): Payer: Medicare HMO | Admitting: Adult Health

## 2021-06-09 VITALS — BP 110/66 | HR 87 | Temp 97.5°F | Ht 64.0 in | Wt 162.0 lb

## 2021-06-09 DIAGNOSIS — N390 Urinary tract infection, site not specified: Secondary | ICD-10-CM | POA: Diagnosis not present

## 2021-06-09 DIAGNOSIS — R609 Edema, unspecified: Secondary | ICD-10-CM

## 2021-06-09 DIAGNOSIS — I10 Essential (primary) hypertension: Secondary | ICD-10-CM

## 2021-06-09 DIAGNOSIS — I2699 Other pulmonary embolism without acute cor pulmonale: Secondary | ICD-10-CM

## 2021-06-09 DIAGNOSIS — H938X3 Other specified disorders of ear, bilateral: Secondary | ICD-10-CM

## 2021-06-09 MED ORDER — HYDROCHLOROTHIAZIDE 25 MG PO TABS: ORAL_TABLET | ORAL | 1 refills | Status: DC

## 2021-06-09 MED ORDER — DULOXETINE HCL 60 MG PO CPEP: ORAL_CAPSULE | ORAL | 3 refills | Status: DC

## 2021-06-09 NOTE — Progress Notes (Signed)
Assessment and Plan:  Diagnoses and all orders for this visit:  Other acute pulmonary embolism without acute cor pulmonale (HCC) Sx resolved; Continue xarelto x 3-6 months then taper plan;   Sensation of fullness in both ears Otitis effusion- no infection- suggest flonase/nasonex, allergy pills, sudafed and hold nose while drinking water to autoinflate, if drainage from ear, fever, chills, HA, nausea, follow up in office   Urinary tract infection without hematuria, site unspecified Sx resolved following treatment;  -     Urinalysis w microscopic + reflex cultur  Hypertension Improved since retirement; reviewed and advised PRN HCTZ only to avoid hypotension; push fluid intake -     hydrochlorothiazide (HYDRODIURIL) 25 MG tablet; TAKE 1/2 - 1 TABLET BY MOUTH AS NEEDED ONLY FOR BLOOD PRESSURE AND FLUID RETENTION/ ANKLE SWELLING  Other orders -     DULoxetine (CYMBALTA) 60 MG capsule; Take  1 capsule  Daily  for Mood & Chronic Back Pain   Further disposition pending results of labs. Discussed med's effects and SE's.   Over 30 minutes of exam, counseling, chart review, and critical decision making was performed.   Future Appointments  Date Time Provider [place]  06/10/2021  2:15 [place] [id number] [facility]  08/11/2021  9:00 AM [name], NP [facility] None  04/26/2022 11:00 AM [name], NP [facility] None    ------------------------------------------------------------------------------------------------------------------   HPI BP 110/66   Pulse (!) 107   Temp (!) 97.5 F (36.4 C)   Ht '5\' 4"'$  (1.626 m)   Wt 162 lb (73.5 kg)   SpO2 97%   BMI 27.81 kg/m  70 y.o.female presents for follow up.   She had covid 19 in 04/2021, had tachycardia with + d dimer on 05/13/2021, CTA showed small LUL PE on 05/14/2021, now on Xarelto 20 mg daily after starting pack for PE, has been on for 1 month and reports all sx resolved.  She reports has had bilateral ear  echoing/fullness persistent following covid 19 in July, has tried sudafed which has helped, and just started fexofenadine, sneezing has improved on this. Requesting ear check today.   She reports had UTI sx in July, mainly dysuria, hx of recurrent UTI, Dr. Melford Aase sent in presumptive treatment (macrobid) which she completed and sx resolved. Denies any today but requesting recheck urine.   She had mechanical fall with compression fracture, taking cymbalta for chronic pain, tried 30 mg but feels 60 mg works better and requests script update. She will be starting PT for balance and back pain tomorrow.   Past Medical History:  Diagnosis Date   Closed fracture of right proximal humerus 04/07/2012   Diverticulosis of colon 02/14/2020   Frequent UTI    GERD (gastroesophageal reflux disease)    History of pulmonary embolus (PE) 11/03/2012   Psoriasis    Thoracic compression fracture (HCC)      Allergies  Allergen Reactions   Penicillins Rash   Latex Rash    Current Outpatient Medications on File Prior to Visit  Medication Sig   alendronate (FOSAMAX) 70 MG tablet Take 1 tablet (70 mg total) by mouth every 7 (seven) days. Take with a full glass of water on an empty stomach.   Ascorbic Acid (VITAMIN C/ROSE HIPS PO) Take 500 mg by mouth daily.   benzonatate (TESSALON) 200 MG capsule Take 1 perle 3 x / day to prevent cough (Patient taking differently: Take 1 perle 3 x / day to prevent cough prn)   calcipotriene (DOVONOX) 0.005 %  cream Apply 1 application topically at bedtime.   calcium carbonate (TUMS EX) 750 MG chewable tablet Chew 1 tablet by mouth daily.   Cholecalciferol (VITAMIN D-3) 125 MCG (5000 UT) TABS Alternate taking 1 and 2 caps every other day.   CINNAMON PO Take 1,000 mg by mouth daily.   CRANBERRY PO Take by mouth. Take 2 caplets BID   DULoxetine (CYMBALTA) 30 MG capsule Take  1 capsule  Daily  for Mood & Chronic Back Pain (Patient taking differently: 60 mg. Take  1 capsule  Daily   for Mood & Chronic Back Pain)   famotidine (PEPCID) 20 MG tablet TAKE ONE TABLET BY MOUTH TWICE A DAY FOR INDIGESTION AND HEARTBURN   hydrochlorothiazide (HYDRODIURIL) 25 MG tablet TAKE ONE TABLET BY MOUTH DAILY FOR BLOOD PRESSURE AND FLUID RETENTION/ ANKLE SWELLING   Multiple Vitamin (MULTI-VITAMIN DAILY PO) Take 1 tablet by mouth daily.   omeprazole (PRILOSEC) 20 MG capsule Take 20 mg by mouth at bedtime.   rivaroxaban (XARELTO) 20 MG TABS tablet Take 1 tablet Daily to Prevent Blood Clots   triamcinolone cream (KENALOG) 0.1 % Apply 1 application topically 2 (two) times daily.   cetirizine (ZYRTEC) 10 MG tablet Take 10 mg by mouth as needed for allergies. (Patient not taking: Reported on 06/09/2021)   fluconazole (DIFLUCAN) 150 MG tablet Take 1 tab daily for 2 weeks for skin rash. (Patient not taking: Reported on 06/09/2021)   predniSONE (DELTASONE) 10 MG tablet Take  1/2 to 1 tablet  3 x /day  as directed for Inflammation   No current facility-administered medications on file prior to visit.    ROS: all negative except above.   Physical Exam:  BP 110/66   Pulse (!) 107   Temp (!) 97.5 F (36.4 C)   Ht '5\' 4"'$  (1.626 m)   Wt 162 lb (73.5 kg)   SpO2 97%   BMI 27.81 kg/m   General Appearance: Well nourished, well dressed adult female in no apparent distress. Eyes: PERRLA, EOMs, conjunctiva no swelling or erythema Sinuses: No Frontal/maxillary tenderness ENT/Mouth: Ext aud canals clear, TMs without erythema, bulging. No erythema, swelling, or exudate on post pharynx.  Tonsils not swollen or erythematous. Hearing normal.  Neck: Supple Respiratory: Respiratory effort normal, BS equal bilaterally without rales, rhonchi, wheezing or stridor.  Cardio: RRR with no MRGs. Brisk peripheral pulses without edema.  Lymphatics: Non tender without lymphadenopathy.  Musculoskeletal: kyphosis, 5/5 strength, normal gait.  Skin: Warm, dry without rashes, lesions, ecchymosis.  Neuro: Normal muscle  tone Psych: Awake and oriented X 3, normal affect, Insight and Judgment appropriate.     Izora Ribas, NP 11:35 AM [facility]

## 2021-06-10 ENCOUNTER — Ambulatory Visit: Payer: Medicare HMO | Attending: Adult Health | Admitting: Physical Therapy

## 2021-06-10 ENCOUNTER — Encounter: Payer: Self-pay | Admitting: Physical Therapy

## 2021-06-10 DIAGNOSIS — R2681 Unsteadiness on feet: Secondary | ICD-10-CM | POA: Diagnosis not present

## 2021-06-10 DIAGNOSIS — R293 Abnormal posture: Secondary | ICD-10-CM | POA: Diagnosis not present

## 2021-06-10 DIAGNOSIS — M2560 Stiffness of unspecified joint, not elsewhere classified: Secondary | ICD-10-CM | POA: Diagnosis not present

## 2021-06-10 DIAGNOSIS — M546 Pain in thoracic spine: Secondary | ICD-10-CM | POA: Diagnosis not present

## 2021-06-10 NOTE — Patient Instructions (Signed)
Access Code [email or web address]  Access Code: [email or web address] URL: [email or web address] Date: 06/10/2021 Prepared by: Raeford Razor  Exercises Supine Lower Trunk Rotation - 1 x daily - 7 x weekly - 2 sets - 10 reps - 10 hold Sidelying Thoracic Rotation with Open Book - 1 x daily - 7 x weekly - 1 sets - 5 reps - 10-30 hold Supine Shoulder Horizontal Abduction with Resistance - 1 x daily - 7 x weekly - 2 sets - 10 reps - 5 hold Corner Pec Major Stretch - 1 x daily - 7 x weekly - 1 sets - 5 reps - 30 hold

## 2021-06-10 NOTE — Therapy (Signed)
[place], [place], [address] Phone: [phone]   Fax:  [phone]  Physical Therapy Evaluation  Patient Details  Name: Vanessa French MRN: [id number] Date of Birth: Jul 28, 1951 Referring Provider (PT): Vanessa Comber, NP   Encounter Date: 06/10/2021   PT End of Session - 06/10/21 1607     Visit Number 1    Number of Visits 12    Date for PT Re-Evaluation 07/22/21    Authorization Type [employer] MCR    PT Start Time 1416    PT Stop Time 1505    PT Time Calculation (min) 49 min    Activity Tolerance Patient tolerated treatment well    Behavior During Therapy [facility] for tasks assessed/performed             Past Medical History:  Diagnosis Date   Closed fracture of right proximal humerus 04/07/2012   Diverticulosis of colon 02/14/2020   Frequent UTI    GERD (gastroesophageal reflux disease)    History of pulmonary embolus (PE) 11/03/2012   Psoriasis    Thoracic compression fracture [facility])     Past Surgical History:  Procedure Laterality Date   APPENDECTOMY  1959   BLADDER SUSPENSION  11/1998   BREAST CYST ASPIRATION Right 2011   CATARACT EXTRACTION, BILATERAL Bilateral 2016   Dr. Vanessa French REPAIR  11/10/2018   Dr. Alona French, [place]  11/10/2018   Dr. Alona French   EXTENSOR TENDON OF FOREARM / WRIST REPAIR Left 2010   LEFT ARM   ORIF HUMERUS FRACTURE  04/07/2012   Procedure: OPEN REDUCTION INTERNAL FIXATION (ORIF) PROXIMAL HUMERUS FRACTURE;  Surgeon: Vanessa Killings, MD;  Location: [place];  Service: Orthopedics;  Laterality: Right;  Open Reduction Internal Fixation Right Proxmial Humerus   STERIOD INJECTION  04/07/2012   Procedure: STEROID INJECTION;  Surgeon: Vanessa Killings, MD;  Location: [place];  Service: Orthopedics;  Laterality: Right;   Right 1st Dorsal Compartment Injection   TONSILLECTOMY  1962   URETER REVISION Left 11/3003   COMPLIATION OF BLADDER SLING   vaginal  hysterectomy with unilateral salpingooopherectomy  2000    There were no vitals filed for this visit.    Subjective Assessment - 06/10/21 1426     Subjective Patient with history of falls, osteopenia and multiple fractures.  At her physical her PA recommended she have PT for her balance and her thoracic pain. She has not fallen recently but does feel unsteady when walking and often has to hold the wall or furniture to steady herself.  She denies weakness.  Shoulders limited due to fractures years ago.  She is trying to keep her posture from declining further, wants to be abel to sit straighter.  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    Pertinent History PE, shoulder fracture, compression fractures in T1, T12 and neck as well, osteopenia    Limitations Sitting;House hold activities;Standing;Walking    How long can you sit comfortably? needs a soft supported chair    How long can you stand comfortably? off and on has to sit , not sure    Diagnostic tests CT lung showed Chronic T1, T3, T11 and T12 vertebral compression fractures    Patient Stated Goals Patient would like to be able to improve posture and walk with better balance, steadiness    Currently in Pain? Yes    Pain Score 1  Pain Location Back    Pain Orientation Mid    Pain Descriptors / Indicators Sharp;Aching    Pain Type Chronic pain    Pain Onset More than a month ago    Pain Frequency Intermittent    Aggravating Factors  lying down, bending down , sitting long periods, poor support.    Pain Relieving Factors Cymbalta    Effect of Pain on Daily Activities Has to change positions , limited activity , sitting in hard chair in restaurant is hard                [facility] PT Assessment - 06/10/21 0001       Assessment   Medical Diagnosis Back pain , thoracic  and gait unsteadiness    Referring Provider (PT) Vanessa Comber, NP    Onset Date/Surgical Date --   1 year , chronic   Next MD Visit as needed     Prior Therapy for shoulders      Precautions   Precautions None;Fall    Precaution Comments falls      Restrictions   Weight Bearing Restrictions No      Balance Screen   Has the patient fallen in the past 6 months No   1 x in a year.  fell backwards down steps   Has the patient had a decrease in activity level because of a fear of falling?  Yes    Is the patient reluctant to leave their home because of a fear of falling?  Yes      [place] Private residence    Living Arrangements Spouse/significant other    Type of [place] to enter    Entrance Stairs-Number of Steps 5    Entrance Stairs-Rails Right;Left    Home Layout Two level      Prior Function   Level of Independence Independent with basic ADLs    Vocation Retired    Chief Technology Officer , is prn      Cognition   Overall Cognitive Status Within Functional Limits for tasks assessed      Observation/Other Assessments   Focus on Therapeutic Outcomes (FOTO)  48      Sensation   Light Touch Appears Intact      Posture/Postural Control   Posture/Postural Control Postural limitations    Postural Limitations Rounded Shoulders;Forward head;Increased thoracic kyphosis    Posture Comments post tilt      AROM   Right Shoulder Flexion 110 Degrees    Left Shoulder Flexion 90 Degrees      PROM   Overall PROM Comments tight Rt hip ER> Lt      Strength   Right Shoulder Flexion 3+/5    Right Shoulder ABduction 3+/5    Left Shoulder Flexion 3/5    Left Shoulder ABduction 3/5    Right Hip Flexion 4/5    Left Hip Flexion 4/5    Right Knee Flexion 5/5    Right Knee Extension 5/5    Left Knee Flexion 5/5    Left Knee Extension 5/5      Palpation   Palpation comment not painful in mid thoracic spine      Transfers   Five time sit to stand comments  18 sec      Timed Up and Go Test   Normal TUG (seconds) 11      High Level Balance   High Level Balance  Comments narrow  BOS, EC/EO, added head turns and nods, tandem < 10 sec  (quick balance screen)                        Objective measurements completed on examination: See above findings.       [place] Adult PT Treatment/Exercise - 06/10/21 0001       Lumbar Exercises: Stretches   Lower Trunk Rotation 5 reps;10 seconds    Other Lumbar Stretch Exercise upper trunk rotaiton x 5 each side      Shoulder Exercises: Supine   Horizontal ABduction Both;10 reps      Shoulder Exercises: Stretch   Corner Stretch 3 reps;30 seconds                    PT Education - 06/10/21 1607     Education Details PT< POC, fall risk, HEP, posture, avoid flexion and rotation    Person(s) Educated Patient    Methods Explanation;Handout    Comprehension Verbalized understanding;Returned demonstration                 PT Long Term Goals - 06/10/21 1614       PT LONG TERM GOAL #1   Title Pt will be I with HEP for posture, strength and balance    Baseline given on eval    Time 6    Period Weeks    Status New    Target Date 07/22/21      PT LONG TERM GOAL #2   Title Pt will increase FOTO score to 56% or better to dmeo functional improvement    Baseline 48%    Time 6    Period Weeks    Status New    Target Date 07/22/21      PT LONG TERM GOAL #3   Title Pt will be able to proper identify and demo safe lifting techniques for items below waist height to avoid injury.    Time 6    Period Weeks    Status New    Target Date 07/22/21      PT LONG TERM GOAL #4   Title Pt will improve her DGI score based on results , TBA    Time 6    Period Weeks    Status New    Target Date 07/22/21      PT LONG TERM GOAL #5   Title Pt will reports being able to walk with husband at night with improved confidence    Time 6    Period Weeks    Status New    Target Date 07/22/21                    Plan - 06/10/21 1608     Clinical Impression Statement Pt presents  for low complexity evaluation of chronic thoracic pain and increasing gait unsteadiness which has been > 1 yr.  She has not fallen in > 1 yr but is very conscious of her balance when walking. She does not use an assistve device. She has significant thoracic kyphosis and pain when sitting against a hard chair, finds it hard to support her self with good sitting posture. She was given a basic HEP and will benefit from skiled PT to maximize her postural strength and endurance.    Personal Factors and Comorbidities Time since onset of injury/illness/exacerbation;Comorbidity 2    Comorbidities osteopenia, falls    Examination-Activity Limitations Sit;Transfers;Bed Mobility;Sleep;Squat;Lift;Bend;Caring for Others;Stairs;Stand;Reach Overhead  Examination-Participation Restrictions Interpersonal Relationship;Community Activity;Other    Stability/Clinical Decision Making Stable/Uncomplicated    Clinical Decision Making Low    Rehab Potential Good    PT Frequency 2x / week    PT Duration 6 weeks    PT Treatment/Interventions ADLs/Self Care Home Management;Therapeutic exercise;Patient/family education;Manual techniques;Passive range of motion;Taping;Dry needling;Balance training;Functional mobility training;Neuromuscular re-education;Moist Heat;DME Instruction;Cryotherapy;Gait training;Therapeutic activities    PT Next Visit Plan FOTO score ed. education ([employer] website) and review HEP. DGI , UBE    PT Home Exercise Plan Access Code: [email or web address]  URL: [email or web address]  Date: 06/10/2021  Prepared by: Raeford Razor    Exercises  Supine Lower Trunk Rotation - 1 x daily - 7 x weekly - 2 sets - 10 reps - 10 hold  Sidelying Thoracic Rotation with Open Book - 1 x daily - 7 x weekly - 1 sets - 5 reps - 10-30 hold  Supine Shoulder Horizontal Abduction with Resistance - 1 x daily - 7 x weekly - 2 sets - 10 reps - 5 hold  Corner Pec Major Stretch - 1 x daily - 7 x weekly - 1 sets - 5 reps - 30  hold    Consulted and Agree with Plan of Care Patient             Patient will benefit from skilled therapeutic intervention in order to improve the following deficits and impairments:  Decreased strength, Increased fascial restricitons, Impaired flexibility, Impaired UE functional use, Postural dysfunction, Pain, Decreased range of motion, Decreased mobility, Decreased balance  Visit Diagnosis: Pain in thoracic spine  Unsteadiness on feet  Joint stiffness of spine     Problem List Patient Active Problem List   Diagnosis Date Noted   Acute pulmonary embolism ([place]) 05/14/2021   Aortic atherosclerosis ([place])- CT 04/2021 05/14/2021   B12 deficiency 08/07/2020   Thoracic compression fracture ([place]) 07/30/2020   Hypertension 12/21/2018   GERD (gastroesophageal reflux disease) 07/13/2018   Psoriasis 12/15/2017   Medication management 12/14/2017   Osteopenia of multiple sites 12/14/2017   BMI 27.0-27.9,adult 12/14/2017   Hyperlipidemia 12/14/2017   Vitamin D deficiency 12/14/2017   OAB (overactive bladder) 11/30/2016   History of prediabetes 11/30/2016   Anxiety 11/03/2012    Gracin Mcpartland 06/10/2021, 4:21 PM  [facility] [place] [address] [place], [place], [address] Phone: [phone]   Fax:  [phone]  Name: Vanessa French MRN: [id number] Date of Birth: [birth year]  Raeford Razor, PT 06/10/21 4:21 PM Phone: [phone] Fax: [phone]

## 2021-06-12 LAB — URINE CULTURE
MICRO NUMBER:: 12253933
SPECIMEN QUALITY:: ADEQUATE

## 2021-06-12 LAB — URINALYSIS W MICROSCOPIC + REFLEX CULTURE
Bacteria, UA: NONE SEEN /HPF
Bilirubin Urine: NEGATIVE
Glucose, UA: NEGATIVE
Ketones, ur: NEGATIVE
Nitrites, Initial: NEGATIVE
Protein, ur: NEGATIVE
Specific Gravity, Urine: 1.011 (ref 1.001–1.035)
Squamous Epithelial / HPF: NONE SEEN /HPF (ref <=5)
WBC, UA: >=60 /HPF — AB (ref 0–5)
pH: 6.5 (ref 5.0–8.0)

## 2021-06-12 LAB — CULTURE INDICATED

## 2021-06-15 ENCOUNTER — Ambulatory Visit: Payer: Medicare HMO | Admitting: Physical Therapy

## 2021-06-15 ENCOUNTER — Other Ambulatory Visit: Payer: Self-pay

## 2021-06-15 ENCOUNTER — Encounter: Payer: Self-pay | Admitting: Physical Therapy

## 2021-06-15 DIAGNOSIS — R293 Abnormal posture: Secondary | ICD-10-CM | POA: Diagnosis not present

## 2021-06-15 DIAGNOSIS — M2560 Stiffness of unspecified joint, not elsewhere classified: Secondary | ICD-10-CM | POA: Diagnosis not present

## 2021-06-15 DIAGNOSIS — M546 Pain in thoracic spine: Secondary | ICD-10-CM

## 2021-06-15 DIAGNOSIS — R2681 Unsteadiness on feet: Secondary | ICD-10-CM | POA: Diagnosis not present

## 2021-06-15 NOTE — Therapy (Signed)
[place], [place], [address] Phone: [phone]   Fax:  [phone]  Physical Therapy Treatment  Patient Details  Name: Vanessa French MRN: [id number] Date of Birth: Mar 20, 1951 Referring Provider (PT): [name], NP   Encounter Date: 06/15/2021   PT End of Session - 06/15/21 1456     Visit Number 2    Number of Visits 12    Date for PT Re-Evaluation 07/22/21    Authorization Type Aetna MCR    PT Start Time 1415    PT Stop Time 1500    PT Time Calculation (min) 45 min    Activity Tolerance Patient tolerated treatment well    Behavior During Therapy [facility] for tasks assessed/performed             Past Medical History:  Diagnosis Date   Closed fracture of right proximal humerus 04/07/2012   Diverticulosis of colon 02/14/2020   Frequent UTI    GERD (gastroesophageal reflux disease)    History of pulmonary embolus (PE) 11/03/2012   Psoriasis    Thoracic compression fracture [facility])     Past Surgical History:  Procedure Laterality Date   APPENDECTOMY  1959   BLADDER SUSPENSION  11/1998   BREAST CYST ASPIRATION Right 2011   CATARACT EXTRACTION, BILATERAL Bilateral 2016   Dr. Vanessa [place] REPAIR  11/10/2018   Dr. Alona Bene, [place]  11/10/2018   Dr. Alona Bene   EXTENSOR TENDON OF FOREARM / WRIST REPAIR Left 2010   LEFT ARM   ORIF HUMERUS FRACTURE  04/07/2012   Procedure: OPEN REDUCTION INTERNAL FIXATION (ORIF) PROXIMAL HUMERUS FRACTURE;  Surgeon: Marybelle Killings, MD;  Location: [place];  Service: Orthopedics;  Laterality: Right;  Open Reduction Internal Fixation Right Proxmial Humerus   STERIOD INJECTION  04/07/2012   Procedure: STEROID INJECTION;  Surgeon: Marybelle Killings, MD;  Location: [place];  Service: Orthopedics;  Laterality: Right;   Right 1st Dorsal Compartment Injection   TONSILLECTOMY  1962   URETER REVISION Left 11/3003   COMPLIATION OF BLADDER SLING   vaginal  hysterectomy with unilateral salpingooopherectomy  2000    There were no vitals filed for this visit.   Subjective Assessment - 06/15/21 1426     Subjective I have no pain right now.  One of the exercises makes my back hurt.  I am dizzy today.  When bending forward I get "swimmy" headed. Took meclizine.    Currently in Pain? No/denies                [facility] PT Assessment - 06/15/21 0001       Dynamic Gait Index   Level Surface Normal    Change in Gait Speed Normal    Gait with Horizontal Head Turns Mild Impairment    Gait with Vertical Head Turns Mild Impairment    Gait and Pivot Turn Moderate Impairment    Step Over Obstacle Normal    Step Around Obstacles Normal    Steps Normal    Total Score 20    DGI comment: 20/24              [facility] Adult PT Treatment/Exercise - 06/15/21 0001       Lumbar Exercises: Stretches   Lower Trunk Rotation 5 reps;10 seconds    Other Lumbar Stretch Exercise upper trunk rotaiton x 5 each side   modified for pain,on Rt side with each movement  Shoulder Exercises: Supine   Horizontal ABduction Both;15 reps;Theraband    Theraband Level (Shoulder Horizontal ABduction) Level 2 (Red)    Horizontal ABduction Weight (lbs) cues for chin tuck    External Rotation Strengthening;Both;20 reps    Theraband Level (Shoulder External Rotation) Level 2 (Red)    Shoulder Flexion Weight (lbs) narrow grip    Flexion Limitations red band with chin tuck      Shoulder Exercises: ROM/Strengthening   UBE (Upper Arm Bike) 5 min L1 2.5 min each direction      Shoulder Exercises: Stretch   Corner Stretch 3 reps;30 seconds    Wall Stretch - Flexion 5 reps;10 seconds                         PT Long Term Goals - 06/10/21 1614       PT LONG TERM GOAL #1   Title Pt will be I with HEP for posture, strength and balance    Baseline given on eval    Time 6    Period Weeks    Status New    Target Date 07/22/21      PT LONG TERM GOAL #2    Title Pt will increase FOTO score to 56% or better to dmeo functional improvement    Baseline 48%    Time 6    Period Weeks    Status New    Target Date 07/22/21      PT LONG TERM GOAL #3   Title Pt will be able to proper identify and demo safe lifting techniques for items below waist height to avoid injury.    Time 6    Period Weeks    Status New    Target Date 07/22/21      PT LONG TERM GOAL #4   Title Pt will improve her DGI score based on results , TBA    Time 6    Period Weeks    Status New    Target Date 07/22/21      PT LONG TERM GOAL #5   Title Pt will reports being able to walk with husband at night with improved confidence    Time 6    Period Weeks    Status New    Target Date 07/22/21                   Plan - 06/15/21 1458     Clinical Impression Statement Modified upper trunk rotation increased pain in Rt side of back, improved with modification of bending elbow. She added resistance bands in standing for upper back strength.  Unable to fully reach overhead with L UE due to chronic issue.  DGI score 20/24, showing difficulty with head turns and pivoting quickly.  Cont POC for posture, body density and balance. Given info for [employer].    PT Treatment/Interventions ADLs/Self Care Home Management;Therapeutic exercise;Patient/family education;Manual techniques;Passive range of motion;Taping;Dry needling;Balance training;Functional mobility training;Neuromuscular re-education;Moist Heat;DME Instruction;Cryotherapy;Gait training;Therapeutic activities    PT Next Visit Plan FOTO score ed. education, posture, extension and UBE    PT Home Exercise Plan Access Code: [email or web address]    Consulted and Agree with Plan of Care Patient             Patient will benefit from skilled therapeutic intervention in order to improve the following deficits and impairments:  Decreased strength, Increased fascial restricitons, Impaired flexibility, Impaired UE functional  use, Postural dysfunction, Pain, Decreased range  of motion, Decreased mobility, Decreased balance  Visit Diagnosis: Pain in thoracic spine  Unsteadiness on feet  Joint stiffness of spine     Problem List Patient Active Problem List   Diagnosis Date Noted   Acute pulmonary embolism ([place]) 05/14/2021   Aortic atherosclerosis ([place])- CT 04/2021 05/14/2021   B12 deficiency 08/07/2020   Thoracic compression fracture ([place]) 07/30/2020   Hypertension 12/21/2018   GERD (gastroesophageal reflux disease) 07/13/2018   Psoriasis 12/15/2017   Medication management 12/14/2017   Osteopenia of multiple sites 12/14/2017   BMI 27.0-27.9,adult 12/14/2017   Hyperlipidemia 12/14/2017   Vitamin D deficiency 12/14/2017   OAB (overactive bladder) 11/30/2016   History of prediabetes 11/30/2016   Anxiety 11/03/2012    Kynlea Blackston 06/15/2021, 3:27 PM  [place] [facility] [address] [place], [place], [address] Phone: [phone]   Fax:  [phone]  Name: Vanessa French MRN: [id number] Date of Birth: [birth year]  Raeford Razor, PT 06/15/21 3:27 PM Phone: [phone] Fax: [phone]

## 2021-06-18 ENCOUNTER — Ambulatory Visit: Payer: Medicare HMO

## 2021-06-18 ENCOUNTER — Other Ambulatory Visit: Payer: Self-pay

## 2021-06-18 DIAGNOSIS — M2560 Stiffness of unspecified joint, not elsewhere classified: Secondary | ICD-10-CM | POA: Diagnosis not present

## 2021-06-18 DIAGNOSIS — R293 Abnormal posture: Secondary | ICD-10-CM

## 2021-06-18 DIAGNOSIS — M546 Pain in thoracic spine: Secondary | ICD-10-CM

## 2021-06-18 DIAGNOSIS — R2681 Unsteadiness on feet: Secondary | ICD-10-CM

## 2021-06-18 NOTE — Therapy (Signed)
[place], [place], [address] Phone: [phone]   Fax:  [phone]  Physical Therapy Treatment  Patient Details  Name: Vanessa French MRN: [id number] Date of Birth: 1951-01-29 Referring Provider (PT): [name], NP   Encounter Date: 06/18/2021   PT End of Session - 06/18/21 1537     Visit Number 3    Number of Visits 12    Date for PT Re-Evaluation 07/22/21    Authorization Type Aetna MCR    PT Start Time 1419    PT Stop Time 1500    PT Time Calculation (min) 41 min    Activity Tolerance Patient tolerated treatment well    Behavior During Therapy [facility] for tasks assessed/performed             Past Medical History:  Diagnosis Date   Closed fracture of right proximal humerus 04/07/2012   Diverticulosis of colon 02/14/2020   Frequent UTI    GERD (gastroesophageal reflux disease)    History of pulmonary embolus (PE) 11/03/2012   Psoriasis    Thoracic compression fracture [facility])     Past Surgical History:  Procedure Laterality Date   APPENDECTOMY  1959   BLADDER SUSPENSION  11/1998   BREAST CYST ASPIRATION Right 2011   CATARACT EXTRACTION, BILATERAL Bilateral 2016   Dr. Vanessa [place] REPAIR  11/10/2018   Dr. Alona Bene, [place]  11/10/2018   Dr. Alona Bene   EXTENSOR TENDON OF FOREARM / WRIST REPAIR Left 2010   LEFT ARM   ORIF HUMERUS FRACTURE  04/07/2012   Procedure: OPEN REDUCTION INTERNAL FIXATION (ORIF) PROXIMAL HUMERUS FRACTURE;  Surgeon: Marybelle Killings, MD;  Location: [place];  Service: Orthopedics;  Laterality: Right;  Open Reduction Internal Fixation Right Proxmial Humerus   STERIOD INJECTION  04/07/2012   Procedure: STEROID INJECTION;  Surgeon: Marybelle Killings, MD;  Location: [place];  Service: Orthopedics;  Laterality: Right;   Right 1st Dorsal Compartment Injection   TONSILLECTOMY  1962   URETER REVISION Left 11/3003   COMPLIATION OF BLADDER SLING   vaginal  hysterectomy with unilateral salpingooopherectomy  2000    There were no vitals filed for this visit.   Subjective Assessment - 06/18/21 1430     Subjective Pt reports no mid back pain now. Pt does report discomfort after exs, which resolves by the next day    Pertinent History PE, shoulder fracture, compression fractures in T1, T12 and neck as well, osteopenia    Limitations Sitting;House hold activities;Standing;Walking    How long can you sit comfortably? needs a soft supported chair    Diagnostic tests CT lung showed Chronic T1, T3, T11 and T12 vertebral compression fractures    Patient Stated Goals Patient would like to be able to improve posture and walk with better balance, steadiness    Currently in Pain? No/denies    Pain Location Back    Pain Orientation Mid    Pain Descriptors / Indicators Aching;Sharp    Pain Type Chronic pain    Pain Onset More than a month ago    Pain Frequency Intermittent    Aggravating Factors  lying down, bending down , sitting long periods, poor support    Pain Relieving Factors Cymbalta    Effect of Pain on Daily Activities Has to change positions , limited activity , sitting in hard chair in restaurant is hard  [place] Adult PT Treatment/Exercise - 06/18/21 0001       Exercises   Exercises Shoulder;Lumbar;Knee/Hip      Lumbar Exercises: Stretches   Other Lumbar Stretch Exercise upper trunk rotaiton x 10 each side   in standing, open book     Knee/Hip Exercises: Standing   Heel Raises Both;2 sets;10 reps    Heel Raises Limitations Toe lifts, counter    Hip Abduction Right;Left;2 sets;10 reps    Abduction Limitations c counter      Shoulder Exercises: Standing   Horizontal ABduction Both;10 reps   with soft roller on wall; 2 sets   External Rotation Both;10 reps   with soft roller on wall     Shoulder Exercises: Stretch   Corner Stretch 3 reps;30 seconds    Wall Stretch - Flexion 5  reps;10 seconds                 Balance Exercises - 06/18/21 0001       Balance Exercises: Standing   Tandem Stance Eyes open;Upper extremity support 1;2 reps;30 secs               PT Education - 06/18/21 1536     Education Details Updated HEP    Person(s) Educated Patient    Methods Explanation;Demonstration;Tactile cues;Verbal cues;Handout    Comprehension Tactile cues required;Verbal cues required;Returned demonstration;Verbalized understanding                 PT Long Term Goals - 06/10/21 1614       PT LONG TERM GOAL #1   Title Pt will be I with HEP for posture, strength and balance    Baseline given on eval    Time 6    Period Weeks    Status New    Target Date 07/22/21      PT LONG TERM GOAL #2   Title Pt will increase FOTO score to 56% or better to dmeo functional improvement    Baseline 48%    Time 6    Period Weeks    Status New    Target Date 07/22/21      PT LONG TERM GOAL #3   Title Pt will be able to proper identify and demo safe lifting techniques for items below waist height to avoid injury.    Time 6    Period Weeks    Status New    Target Date 07/22/21      PT LONG TERM GOAL #4   Title Pt will improve her DGI score based on results , TBA    Time 6    Period Weeks    Status New    Target Date 07/22/21      PT LONG TERM GOAL #5   Title Pt will reports being able to walk with husband at night with improved confidence    Time 6    Period Weeks    Status New    Target Date 07/22/21                   Plan - 06/18/21 1537     Clinical Impression Statement Pt participated in PT to address her forward flexed trunk posture, her LE strength, and balance.  LE strengthening and balance exs were added to today's session and to pt's HEP. Pt returned proper demonstration of the new ther ex and tolerated today's session without adverse effects. Pt will continue to benefit from continued skilled PT to adress pt's deficits  to  optimize her safety and fucntional mobility.    Personal Factors and Comorbidities Time since onset of injury/illness/exacerbation;Comorbidity 2    Comorbidities osteopenia, falls    Examination-Activity Limitations Sit;Transfers;Bed Mobility;Sleep;Squat;Lift;Bend;Caring for Others;Stairs;Stand;Reach Overhead    Examination-Participation Restrictions Interpersonal Relationship;Community Activity;Other    Stability/Clinical Decision Making Stable/Uncomplicated    Clinical Decision Making Low    Rehab Potential Good    PT Frequency 2x / week    PT Duration 6 weeks    PT Treatment/Interventions ADLs/Self Care Home Management;Therapeutic exercise;Patient/family education;Manual techniques;Passive range of motion;Taping;Dry needling;Balance training;Functional mobility training;Neuromuscular re-education;Moist Heat;DME Instruction;Cryotherapy;Gait training;Therapeutic activities    PT Next Visit Plan FOTO score ed. education, posture, extension and UBE, Balance nad LE strengthening    PT Home Exercise Plan Access Code: [email or web address]    Consulted and Agree with Plan of Care Patient             Patient will benefit from skilled therapeutic intervention in order to improve the following deficits and impairments:  Decreased strength, Increased fascial restricitons, Impaired flexibility, Impaired UE functional use, Postural dysfunction, Pain, Decreased range of motion, Decreased mobility, Decreased balance  Visit Diagnosis: Pain in thoracic spine  Unsteadiness on feet  Joint stiffness of spine  Abnormal posture     Problem List Patient Active Problem List   Diagnosis Date Noted   Acute pulmonary embolism ([place]) 05/14/2021   Aortic atherosclerosis ([place])- CT 04/2021 05/14/2021   B12 deficiency 08/07/2020   Thoracic compression fracture ([place]) 07/30/2020   Hypertension 12/21/2018   GERD (gastroesophageal reflux disease) 07/13/2018   Psoriasis 12/15/2017   Medication management 12/14/2017    Osteopenia of multiple sites 12/14/2017   BMI 27.0-27.9,adult 12/14/2017   Hyperlipidemia 12/14/2017   Vitamin D deficiency 12/14/2017   OAB (overactive bladder) 11/30/2016   History of prediabetes 11/30/2016   Anxiety 11/03/2012   Gar Ponto MS, PT 06/18/21 3:47 PM   [facility] [address] [place], [place], [address] Phone: [phone]   Fax:  [phone]  Name: WARDELL MULREADY MRN: [id number] Date of Birth: [birth year]

## 2021-06-19 DIAGNOSIS — H524 Presbyopia: Secondary | ICD-10-CM | POA: Diagnosis not present

## 2021-06-19 DIAGNOSIS — H353211 Exudative age-related macular degeneration, right eye, with active choroidal neovascularization: Secondary | ICD-10-CM | POA: Diagnosis not present

## 2021-06-19 DIAGNOSIS — Z961 Presence of intraocular lens: Secondary | ICD-10-CM | POA: Diagnosis not present

## 2021-06-19 DIAGNOSIS — H40053 Ocular hypertension, bilateral: Secondary | ICD-10-CM | POA: Diagnosis not present

## 2021-06-23 ENCOUNTER — Other Ambulatory Visit: Payer: Self-pay

## 2021-06-23 ENCOUNTER — Ambulatory Visit: Payer: Medicare HMO

## 2021-06-23 DIAGNOSIS — R293 Abnormal posture: Secondary | ICD-10-CM

## 2021-06-23 DIAGNOSIS — M2560 Stiffness of unspecified joint, not elsewhere classified: Secondary | ICD-10-CM

## 2021-06-23 DIAGNOSIS — M546 Pain in thoracic spine: Secondary | ICD-10-CM | POA: Diagnosis not present

## 2021-06-23 DIAGNOSIS — R2681 Unsteadiness on feet: Secondary | ICD-10-CM | POA: Diagnosis not present

## 2021-06-23 NOTE — Therapy (Signed)
[place], [place], [address] Phone: [phone]   Fax:  [phone]  Physical Therapy Treatment  Patient Details  Name: Vanessa French MRN: [id number] Date of Birth: [birth year] Referring Provider (PT): [name], NP   Encounter Date: 06/23/2021   PT End of Session - 06/23/21 1417     Visit Number 4    Number of Visits 12    Date for PT Re-Evaluation 07/22/21    Authorization Type Aetna MCR    PT Start Time 1417    PT Stop Time 1500    PT Time Calculation (min) 43 min    Activity Tolerance Patient tolerated treatment well    Behavior During Therapy [facility] for tasks assessed/performed             Past Medical History:  Diagnosis Date   Closed fracture of right proximal humerus 04/07/2012   Diverticulosis of colon 02/14/2020   Frequent UTI    GERD (gastroesophageal reflux disease)    History of pulmonary embolus (PE) 11/03/2012   Psoriasis    Thoracic compression fracture [facility])     Past Surgical History:  Procedure Laterality Date   APPENDECTOMY  1959   BLADDER SUSPENSION  11/1998   BREAST CYST ASPIRATION Right 2011   CATARACT EXTRACTION, BILATERAL Bilateral 2016   Dr. Vanessa [place] REPAIR  11/10/2018   Dr. Alona Bene, [place]  11/10/2018   Dr. Alona Bene   EXTENSOR TENDON OF FOREARM / WRIST REPAIR Left 2010   LEFT ARM   ORIF HUMERUS FRACTURE  04/07/2012   Procedure: OPEN REDUCTION INTERNAL FIXATION (ORIF) PROXIMAL HUMERUS FRACTURE;  Surgeon: Marybelle Killings, MD;  Location: [place];  Service: Orthopedics;  Laterality: Right;  Open Reduction Internal Fixation Right Proxmial Humerus   STERIOD INJECTION  04/07/2012   Procedure: STEROID INJECTION;  Surgeon: Marybelle Killings, MD;  Location: [place];  Service: Orthopedics;  Laterality: Right;   Right 1st Dorsal Compartment Injection   TONSILLECTOMY  1962   URETER REVISION Left 11/3003   COMPLIATION OF BLADDER SLING   vaginal  hysterectomy with unilateral salpingooopherectomy  2000    There were no vitals filed for this visit.   Subjective Assessment - 06/23/21 1423     Subjective Pt reports compliance with her HEP. She notes her ability to complete the balance exs is improved.    Pertinent History PE, shoulder fracture, compression fractures in T1, T12 and neck as well, osteopenia    Diagnostic tests CT lung showed Chronic T1, T3, T11 and T12 vertebral compression fractures    Patient Stated Goals Patient would like to be able to improve posture and walk with better balance, steadiness    Currently in Pain? No/denies    Pain Score 0-No pain    Pain Onset More than a month ago    Pain Frequency Intermittent    Aggravating Factors  lying down, bending down , sitting long periods, poor support    Pain Relieving Factors Cymbalta    Effect of Pain on Daily Activities Has to change positions , limited activity , sitting in hard chair in restaurant is hard                               [facility] Adult PT Treatment/Exercise - 06/23/21 0001       Exercises   Exercises Shoulder;Lumbar;Knee/Hip  Lumbar Exercises: Stretches   Lower Trunk Rotation 5 reps;10 seconds      Knee/Hip Exercises: Standing   Heel Raises Both;2 sets;10 reps    Heel Raises Limitations Toe lifts, counter    Hip Abduction Right;Left;2 sets;10 reps    Abduction Limitations 3 lbs, c counter    Hip Extension Right;Left;2 sets;10 reps    Extension Limitations 3 lbs, c counter, plantargrade      Knee/Hip Exercises: Seated   Sit to Sand 10 reps;without UE support   reaching high c small ball     Shoulder Exercises: Supine   External Rotation Strengthening;Both;20 reps;10 reps   2x   Theraband Level (Shoulder External Rotation) Level 2 (Red)    Other Supine Exercises D2, L and R, red Tband      Shoulder Exercises: ROM/Strengthening   Wall Wash 10x, B arms, 3 sec hold    Other ROM/Strengthening Exercises Shoulder  ladder, L and R, 2x, 10s                    PT Education - 06/23/21 1454     Education Details Log rolling for turning to minimize back pain with turning.    Person(s) Educated Patient    Methods Explanation;Demonstration    Comprehension Verbalized understanding;Returned demonstration                 PT Long Term Goals - 06/10/21 1614       PT LONG TERM GOAL #1   Title Pt will be I with HEP for posture, strength and balance    Baseline given on eval    Time 6    Period Weeks    Status New    Target Date 07/22/21      PT LONG TERM GOAL #2   Title Pt will increase FOTO score to 56% or better to dmeo functional improvement    Baseline 48%    Time 6    Period Weeks    Status New    Target Date 07/22/21      PT LONG TERM GOAL #3   Title Pt will be able to proper identify and demo safe lifting techniques for items below waist height to avoid injury.    Time 6    Period Weeks    Status New    Target Date 07/22/21      PT LONG TERM GOAL #4   Title Pt will improve her DGI score based on results , TBA    Time 6    Period Weeks    Status New    Target Date 07/22/21      PT LONG TERM GOAL #5   Title Pt will reports being able to walk with husband at night with improved confidence    Time 6    Period Weeks    Status New    Target Date 07/22/21                   Plan - 06/23/21 1418     Clinical Impression Statement PT was completed for LE and trunk strengthening and trunk ROM to address balance and forward trunk posture. Pt notes her tolerance to lying supine on her back has improved and being compliant with her HEP daily. Pt tolerated the session without adverse effects.    Personal Factors and Comorbidities Time since onset of injury/illness/exacerbation;Comorbidity 2    Comorbidities osteopenia, falls    Examination-Activity Limitations Sit;Transfers;Bed Mobility;Sleep;Squat;Lift;Bend;Caring for Others;Stairs;Stand;Reach Overhead  Examination-Participation Restrictions Interpersonal Relationship;Community Activity;Other    Stability/Clinical Decision Making Stable/Uncomplicated    Clinical Decision Making Low    Rehab Potential Good    PT Frequency 2x / week    PT Duration 6 weeks    PT Treatment/Interventions ADLs/Self Care Home Management;Therapeutic exercise;Patient/family education;Manual techniques;Passive range of motion;Taping;Dry needling;Balance training;Functional mobility training;Neuromuscular re-education;Moist Heat;DME Instruction;Cryotherapy;Gait training;Therapeutic activities    PT Next Visit Plan FOTO score ed. education, posture, extension and UBE, Address balance next PT session.    PT Home Exercise Plan Access Code: [email or web address]    Consulted and Agree with Plan of Care Patient             Patient will benefit from skilled therapeutic intervention in order to improve the following deficits and impairments:  Decreased strength, Increased fascial restricitons, Impaired flexibility, Impaired UE functional use, Postural dysfunction, Pain, Decreased range of motion, Decreased mobility, Decreased balance  Visit Diagnosis: Pain in thoracic spine  Unsteadiness on feet  Joint stiffness of spine  Abnormal posture     Problem List Patient Active Problem List   Diagnosis Date Noted   Acute pulmonary embolism ([place]) 05/14/2021   Aortic atherosclerosis ([place])- CT 04/2021 05/14/2021   B12 deficiency 08/07/2020   Thoracic compression fracture ([place]) 07/30/2020   Hypertension 12/21/2018   GERD (gastroesophageal reflux disease) 07/13/2018   Psoriasis 12/15/2017   Medication management 12/14/2017   Osteopenia of multiple sites 12/14/2017   BMI 27.0-27.9,adult 12/14/2017   Hyperlipidemia 12/14/2017   Vitamin D deficiency 12/14/2017   OAB (overactive bladder) 11/30/2016   History of prediabetes 11/30/2016   Anxiety 11/03/2012    Gar Ponto MS, PT 06/23/21 5:48 PM  [facility] [address] [place], [place], [address] Phone: [phone]   Fax:  [phone]  Name: ROVENIA MINERVINI MRN: [id number] Date of Birth: 04/15/1951

## 2021-06-30 ENCOUNTER — Other Ambulatory Visit: Payer: Self-pay

## 2021-06-30 ENCOUNTER — Ambulatory Visit: Payer: Medicare HMO | Attending: Internal Medicine

## 2021-06-30 DIAGNOSIS — M2560 Stiffness of unspecified joint, not elsewhere classified: Secondary | ICD-10-CM | POA: Insufficient documentation

## 2021-06-30 DIAGNOSIS — M546 Pain in thoracic spine: Secondary | ICD-10-CM | POA: Insufficient documentation

## 2021-06-30 DIAGNOSIS — R2681 Unsteadiness on feet: Secondary | ICD-10-CM | POA: Diagnosis not present

## 2021-06-30 DIAGNOSIS — R293 Abnormal posture: Secondary | ICD-10-CM | POA: Diagnosis not present

## 2021-06-30 NOTE — Therapy (Signed)
[place], [place], [address] Phone: [phone]   Fax:  [phone]  Physical Therapy Treatment  Patient Details  Name: Vanessa French MRN: [id number] Date of Birth: November 23, 1950 Referring Provider (PT): [name], NP   Encounter Date: 06/30/2021   PT End of Session - 06/30/21 1208     Visit Number 5    Number of Visits 12    Date for PT Re-Evaluation 07/22/21    Authorization Type Aetna MCR    PT Start Time 1200    PT Stop Time 1240    PT Time Calculation (min) 40 min    Activity Tolerance Patient tolerated treatment well    Behavior During Therapy [facility] for tasks assessed/performed             Past Medical History:  Diagnosis Date   Closed fracture of right proximal humerus 04/07/2012   Diverticulosis of colon 02/14/2020   Frequent UTI    GERD (gastroesophageal reflux disease)    History of pulmonary embolus (PE) 11/03/2012   Psoriasis    Thoracic compression fracture [place])     Past Surgical History:  Procedure Laterality Date   APPENDECTOMY  1959   BLADDER SUSPENSION  11/1998   BREAST CYST ASPIRATION Right 2011   CATARACT EXTRACTION, BILATERAL Bilateral 2016   Dr. Vanessa [place] REPAIR  11/10/2018   Dr. Alona Bene, [place]  11/10/2018   Dr. Alona Bene   EXTENSOR TENDON OF FOREARM / WRIST REPAIR Left 2010   LEFT ARM   ORIF HUMERUS FRACTURE  04/07/2012   Procedure: OPEN REDUCTION INTERNAL FIXATION (ORIF) PROXIMAL HUMERUS FRACTURE;  Surgeon: Marybelle Killings, MD;  Location: [place];  Service: Orthopedics;  Laterality: Right;  Open Reduction Internal Fixation Right Proxmial Humerus   STERIOD INJECTION  04/07/2012   Procedure: STEROID INJECTION;  Surgeon: Marybelle Killings, MD;  Location: [place];  Service: Orthopedics;  Laterality: Right;   Right 1st Dorsal Compartment Injection   TONSILLECTOMY  1962   URETER REVISION Left 11/3003   COMPLIATION OF BLADDER SLING   vaginal  hysterectomy with unilateral salpingooopherectomy  2000    There were no vitals filed for this visit.   Subjective Assessment - 06/30/21 1311     Subjective Pt offers no concerns.    Pertinent History PE, shoulder fracture, compression fractures in T1, T12 and neck as well, osteopenia    Diagnostic tests CT lung showed Chronic T1, T3, T11 and T12 vertebral compression fractures    Patient Stated Goals Patient would like to be able to improve posture and walk with better balance, steadiness    Currently in Pain? Yes    Pain Score 2     Pain Location Back    Pain Orientation Mid    Pain Descriptors / Indicators Aching    Pain Type Chronic pain    Pain Onset More than a month ago    Pain Frequency Intermittent    Aggravating Factors  lying down, bending down , sitting long periods, poor support    Pain Relieving Factors Cymbalta    Effect of Pain on Daily Activities Has to change positions , limited activity , sitting in hard chair in restaurant is hard                               [facility] Adult PT Treatment/Exercise - 06/30/21 0001  Exercises   Exercises Shoulder;Lumbar;Knee/Hip      Lumbar Exercises: Standing   Row Both;15 reps   2 sets   Theraband Level (Row) Level 3 (Green)    Shoulder Extension Both;15 reps   2 sets   Theraband Level (Shoulder Extension) Level 3 (Green)      Knee/Hip Exercises: Standing   Lateral Step Up Right;Left;2 sets;10 reps    Lateral Step Up Limitations Airex    Wall Squat 2 sets;10 reps   Black Pball     Shoulder Exercises: ROM/Strengthening   Wall Wash 10x, B arms, 3 sec hold    Other ROM/Strengthening Exercises Shoulder flexion c dowel 10x, at wall c soft roller at mid back      Shoulder Exercises: Stretch   Corner Stretch 3 reps;30 seconds    Wall Stretch - Flexion 5 reps   5x2, 5 sec hold                Balance Exercises - 06/30/21 0001       Balance Exercises: Standing   Tandem Stance Eyes  open;Upper extremity support 1;Foam/compliant surface;4 reps;Time   1 min each                   PT Long Term Goals - 06/10/21 1614       PT LONG TERM GOAL #1   Title Pt will be I with HEP for posture, strength and balance    Baseline given on eval    Time 6    Period Weeks    Status New    Target Date 07/22/21      PT LONG TERM GOAL #2   Title Pt will increase FOTO score to 56% or better to dmeo functional improvement    Baseline 48%    Time 6    Period Weeks    Status New    Target Date 07/22/21      PT LONG TERM GOAL #3   Title Pt will be able to proper identify and demo safe lifting techniques for items below waist height to avoid injury.    Time 6    Period Weeks    Status New    Target Date 07/22/21      PT LONG TERM GOAL #4   Title Pt will improve her DGI score based on results , TBA    Time 6    Period Weeks    Status New    Target Date 07/22/21      PT LONG TERM GOAL #5   Title Pt will reports being able to walk with husband at night with improved confidence    Time 6    Period Weeks    Status New    Target Date 07/22/21                   Plan - 06/30/21 1315     Clinical Impression Statement PT was continued for trunk and LE strengthening to improve balance and posture. Pt's ther ex demand was progressed with today's session. Pt tolerated the session without adverse effects.    Personal Factors and Comorbidities Time since onset of injury/illness/exacerbation;Comorbidity 2    Comorbidities osteopenia, falls    Examination-Activity Limitations Sit;Transfers;Bed Mobility;Sleep;Squat;Lift;Bend;Caring for Others;Stairs;Stand;Reach Overhead    Examination-Participation Restrictions Interpersonal Relationship;Community Activity;Other    Stability/Clinical Decision Making Stable/Uncomplicated    Clinical Decision Making Low    Rehab Potential Good    PT Frequency 2x / week  PT Duration 6 weeks    PT Treatment/Interventions ADLs/Self  Care Home Management;Therapeutic exercise;Patient/family education;Manual techniques;Passive range of motion;Taping;Dry needling;Balance training;Functional mobility training;Neuromuscular re-education;Moist Heat;DME Instruction;Cryotherapy;Gait training;Therapeutic activities    PT Next Visit Plan education, posture, extension and UBE, balance and strengthening. Re-assess FOTO    PT Home Exercise Plan Access Code: [email or web address]    Consulted and Agree with Plan of Care Patient             Patient will benefit from skilled therapeutic intervention in order to improve the following deficits and impairments:  Decreased strength, Increased fascial restricitons, Impaired flexibility, Impaired UE functional use, Postural dysfunction, Pain, Decreased range of motion, Decreased mobility, Decreased balance  Visit Diagnosis: Pain in thoracic spine  Unsteadiness on feet  Joint stiffness of spine  Abnormal posture     Problem List Patient Active Problem List   Diagnosis Date Noted   Acute pulmonary embolism ([place]) 05/14/2021   Aortic atherosclerosis ([place])- CT 04/2021 05/14/2021   B12 deficiency 08/07/2020   Thoracic compression fracture ([place]) 07/30/2020   Hypertension 12/21/2018   GERD (gastroesophageal reflux disease) 07/13/2018   Psoriasis 12/15/2017   Medication management 12/14/2017   Osteopenia of multiple sites 12/14/2017   BMI 27.0-27.9,adult 12/14/2017   Hyperlipidemia 12/14/2017   Vitamin D deficiency 12/14/2017   OAB (overactive bladder) 11/30/2016   History of prediabetes 11/30/2016   Anxiety 11/03/2012   Gar Ponto MS, PT 06/30/21 1:47 PM   [facility] [address] [place], [place], [address] Phone: [phone]   Fax:  [phone]  Name: Vanessa French MRN: [id number] Date of Birth: Sep 05, 1951

## 2021-07-02 ENCOUNTER — Ambulatory Visit: Payer: Medicare HMO

## 2021-07-02 ENCOUNTER — Ambulatory Visit (INDEPENDENT_AMBULATORY_CARE_PROVIDER_SITE_OTHER): Payer: Medicare HMO | Admitting: Internal Medicine

## 2021-07-02 ENCOUNTER — Other Ambulatory Visit: Payer: Self-pay

## 2021-07-02 VITALS — BP 126/74 | HR 72 | Temp 98.0°F | Resp 14 | Ht 64.0 in | Wt 164.0 lb

## 2021-07-02 DIAGNOSIS — I10 Essential (primary) hypertension: Secondary | ICD-10-CM | POA: Diagnosis not present

## 2021-07-02 DIAGNOSIS — R293 Abnormal posture: Secondary | ICD-10-CM | POA: Diagnosis not present

## 2021-07-02 DIAGNOSIS — M546 Pain in thoracic spine: Secondary | ICD-10-CM

## 2021-07-02 DIAGNOSIS — R2681 Unsteadiness on feet: Secondary | ICD-10-CM | POA: Diagnosis not present

## 2021-07-02 DIAGNOSIS — M67432 Ganglion, left wrist: Secondary | ICD-10-CM | POA: Diagnosis not present

## 2021-07-02 DIAGNOSIS — M2560 Stiffness of unspecified joint, not elsewhere classified: Secondary | ICD-10-CM | POA: Diagnosis not present

## 2021-07-02 NOTE — Therapy (Signed)
[place], [place], [address] Phone: [phone]   Fax:  [phone]  Physical Therapy Treatment  Patient Details  Name: Vanessa French MRN: [id number] Date of Birth: February 19, 1951 Referring Provider (PT): [name], NP   Encounter Date: 07/02/2021   PT End of Session - 07/02/21 1427     Visit Number 6    Number of Visits 12    Date for PT Re-Evaluation 07/22/21    Authorization Type Aetna MCR    PT Start Time 0220    PT Stop Time 0300    PT Time Calculation (min) 40 min    Activity Tolerance Patient tolerated treatment well    Behavior During Therapy [facility] for tasks assessed/performed             Past Medical History:  Diagnosis Date   Closed fracture of right proximal humerus 04/07/2012   Diverticulosis of colon 02/14/2020   Frequent UTI    GERD (gastroesophageal reflux disease)    History of pulmonary embolus (PE) 11/03/2012   Psoriasis    Thoracic compression fracture [facility])     Past Surgical History:  Procedure Laterality Date   APPENDECTOMY  1959   BLADDER SUSPENSION  11/1998   BREAST CYST ASPIRATION Right 2011   CATARACT EXTRACTION, BILATERAL Bilateral 2016   Dr. Vanessa [place] REPAIR  11/10/2018   Dr. Alona Bene, [place]  11/10/2018   Dr. Alona Bene   EXTENSOR TENDON OF FOREARM / WRIST REPAIR Left 2010   LEFT ARM   ORIF HUMERUS FRACTURE  04/07/2012   Procedure: OPEN REDUCTION INTERNAL FIXATION (ORIF) PROXIMAL HUMERUS FRACTURE;  Surgeon: Marybelle Killings, MD;  Location: [place];  Service: Orthopedics;  Laterality: Right;  Open Reduction Internal Fixation Right Proxmial Humerus   STERIOD INJECTION  04/07/2012   Procedure: STEROID INJECTION;  Surgeon: Marybelle Killings, MD;  Location: [place];  Service: Orthopedics;  Laterality: Right;   Right 1st Dorsal Compartment Injection   TONSILLECTOMY  1962   URETER REVISION Left 11/3003   COMPLIATION OF BLADDER SLING   vaginal  hysterectomy with unilateral salpingooopherectomy  2000    There were no vitals filed for this visit.   Subjective Assessment - 07/02/21 1428     Subjective Pt reports she is doing well.    Pertinent History PE, shoulder fracture, compression fractures in T1, T12 and neck as well, osteopenia    Limitations Sitting;House hold activities;Standing;Walking    How long can you sit comfortably? needs a soft supported chair    How long can you stand comfortably? off and on has to sit , not sure    Diagnostic tests CT lung showed Chronic T1, T3, T11 and T12 vertebral compression fractures    Patient Stated Goals Patient would like to be able to improve posture and walk with better balance, steadiness    Currently in Pain? No/denies    Pain Score 0-No pain    Pain Location Back    Pain Orientation Mid    Pain Descriptors / Indicators Aching    Pain Type Chronic pain    Pain Onset More than a month ago    Pain Frequency Intermittent                               [facility] Adult PT Treatment/Exercise - 07/02/21 0001       Exercises  Exercises Shoulder;Lumbar;Knee/Hip      Knee/Hip Exercises: Standing   Heel Raises Both;2 sets;15 reps    Heel Raises Limitations Toe lifts, counter    Lateral Step Up Right;Left;2 sets;10 reps;Step Height: 6"      Shoulder Exercises: Standing   ABduction Both;15 reps   against Pball   Theraband Level (Shoulder ABduction) Level 3 (Green)    Extension Both;15 reps    Theraband Level (Shoulder Extension) Level 3 (Green)    Row Both;15 reps    Theraband Level (Shoulder Row) Level 3 (Green)    Diagonals Right;Left;10 reps   2 sets   Theraband Level (Shoulder Diagonals) Level 2 (Red)      Shoulder Exercises: ROM/Strengthening   Ball on Wall Flexion 5x, 10sec      Shoulder Exercises: Stretch   Corner Stretch 3 reps;30 seconds                 Balance Exercises - 07/02/21 0001       Balance Exercises: Standing   SLS Eyes  open;Foam/compliant surface;3 reps;30 secs   L and R. Green TBand pad                    PT Long Term Goals - 07/02/21 1542       PT LONG TERM GOAL #2   Title Pt will increase FOTO score to 56% or better to dmeo functional improvement. 07/02/21: 63% functional ability    Baseline 48%    Status Achieved    Target Date 07/02/21                   Plan - 07/02/21 1456     Clinical Impression Statement Pt's FOTO was re-assessed with pt scoring 63% functional ability exceeding the predicte value of 54%. PT was continued to increase pt's LB and trunk strength and balance, and improve her postural presentation. On green theraband balance pad with infrequent UE assist, pt demonstrated appropriate balance. At the end of the PT session, pt indicated her wanting to possibly finish with her PT program on the next visit and continue with her HEP. Will complete re-assessment and mke recommendations for care. If discharge is appropriate, a final HEP will be provided pt.    Personal Factors and Comorbidities Time since onset of injury/illness/exacerbation;Comorbidity 2    Comorbidities osteopenia, falls    Examination-Activity Limitations Sit;Transfers;Bed Mobility;Sleep;Squat;Lift;Bend;Caring for Others;Stairs;Stand;Reach Overhead    Examination-Participation Restrictions Interpersonal Relationship;Community Activity;Other    Stability/Clinical Decision Making Stable/Uncomplicated    Clinical Decision Making Low    Rehab Potential Good    PT Frequency 2x / week    PT Duration 6 weeks    PT Treatment/Interventions ADLs/Self Care Home Management;Therapeutic exercise;Patient/family education;Manual techniques;Passive range of motion;Taping;Dry needling;Balance training;Functional mobility training;Neuromuscular re-education;Moist Heat;DME Instruction;Cryotherapy;Gait training;Therapeutic activities    PT Next Visit Plan education, posture, extension and UBE, balance and strengthening.  Re-assess- pt indicates she may be ready ofr DC to HEP.    PT Home Exercise Plan Access Code: [email or web address]    Consulted and Agree with Plan of Care Patient             Patient will benefit from skilled therapeutic intervention in order to improve the following deficits and impairments:  Decreased strength, Increased fascial restricitons, Impaired flexibility, Impaired UE functional use, Postural dysfunction, Pain, Decreased range of motion, Decreased mobility, Decreased balance  Visit Diagnosis: Pain in thoracic spine  Unsteadiness on feet  Joint stiffness of spine  Problem List Patient Active Problem List   Diagnosis Date Noted   Acute pulmonary embolism ([place]) 05/14/2021   Aortic atherosclerosis ([place])- CT 04/2021 05/14/2021   B12 deficiency 08/07/2020   Thoracic compression fracture ([place]) 07/30/2020   Hypertension 12/21/2018   GERD (gastroesophageal reflux disease) 07/13/2018   Psoriasis 12/15/2017   Medication management 12/14/2017   Osteopenia of multiple sites 12/14/2017   BMI 27.0-27.9,adult 12/14/2017   Hyperlipidemia 12/14/2017   Vitamin D deficiency 12/14/2017   OAB (overactive bladder) 11/30/2016   History of prediabetes 11/30/2016   Anxiety 11/03/2012   Gar Ponto MS, PT 07/02/21 3:52 PM   [facility] [address] [place], [place], [address] Phone: [phone]   Fax:  [phone]  Name: CADYNCE WOLLMAN MRN: [id number] Date of Birth: February 17, 1951

## 2021-07-06 ENCOUNTER — Ambulatory Visit: Payer: Medicare HMO | Admitting: Physical Therapy

## 2021-07-09 ENCOUNTER — Other Ambulatory Visit: Payer: Self-pay | Admitting: Internal Medicine

## 2021-07-09 ENCOUNTER — Other Ambulatory Visit: Payer: Self-pay

## 2021-07-09 ENCOUNTER — Encounter: Payer: Self-pay | Admitting: Internal Medicine

## 2021-07-09 ENCOUNTER — Ambulatory Visit: Payer: Medicare HMO | Admitting: Internal Medicine

## 2021-07-09 DIAGNOSIS — M67432 Ganglion, left wrist: Secondary | ICD-10-CM

## 2021-07-09 MED ORDER — CEPHALEXIN 500 MG PO CAPS: ORAL_CAPSULE | ORAL | 0 refills | Status: DC

## 2021-07-09 NOTE — Progress Notes (Signed)
     ReCheck s/p excision of Ganglion cyst.   Wound appears slightly inflamed, So  Will put on Keflex &  have return in about 5 days for suture removal.

## 2021-07-09 NOTE — Progress Notes (Signed)
Future Appointments  Date Time Provider [place]  07/10/2021 11:45 AM Clint Guy, PT [facility]  08/11/2021  9:00 AM [name], NP [facility] None  04/26/2022 11:00 AM [name], NP [facility] None    History of Present Illness:      Patient is a very nice 70 yo MWF with HTN & GERD who presents for recheck and BP is Normal &  HT systems review is negative. Patient has also recently developed a tender cystic lump over the dorsal lateral Lt wrist .  Medications    alendronate (70 MG tablet, Take 1 tablet every 7 days.    hydrochlorothiazide 25 MG tablet, TAKE 1/2 - 1 TABLET AS NEEDED    cetirizine 10 MG tablet, Take as needed for allergies.   rivaroxaban (XARELTO) 20 MG , Take 1 tablet Daily   VITAMIN C/ROSE HIPS  500 mg d , Take  daily.   DOVONOX 0.005 % cream, Apply at bedtime.   TUMS EX  750 MG chewable tablet, Chew 1 tablet daily.  VITAMIN D 5000 u, Alternate taking 1 and 2 caps every other day.   CINNAMON 1,000 mg , Take daily.   CRANBERRY ,Take 2 caplets BID   DULoxetine  60 MG capsule, Take  1 capsule  Daily     famotidine  20 MG tablet, TAKE ONE TABLET TWICE A DAY    Multiple Vitamin , Take 1 tablet daily.   omeprazole 20 MG capsule, Take at bedtime.   triamcinolone cream  0.1 %, Apply 1 application topically 2 (two) times daily.  Problem list She has Anxiety; OAB (overactive bladder); History of prediabetes; Medication management; Osteopenia of multiple sites; BMI 27.0-27.9,adult; Hyperlipidemia; Vitamin D deficiency; Psoriasis; GERD (gastroesophageal reflux disease); Hypertension; Thoracic compression fracture ([place]); B12 deficiency; Acute pulmonary embolism ([place]); and Aortic atherosclerosis ([place])- CT 04/2021   Observations/Objective:  BP 126/74   Pulse 72   Temp 98 F (36.7 C)   Resp 14   Ht '5\' 4"'$  (1.626 m)   Wt 164 lb (74.4 kg)   SpO2 98%   BMI 28.15 kg/m   HEENT - WNL. Neck - supple.  Chest - Clear equal BS. Cor - Nl HS. RRR  w/o sig MGR. PP 1(+). No edema. MS- FROM w/o deformities.  Gait Nl. Neuro -  Nl w/o focal abnormalities.  Procedure (CPT: P161950 )            After informed consent & aseptic  prep with alcohol, the lesion was anesthetized locally with 4 ml of Marcaine 0.5% .  Then after a longitudinal 3 cm lesion with a #10 scalpel,    A circumscribed  1.5 cm white cyst was sharply dissected free & the wound was closed with  # 4 vertical mattress sutures with Nylon 3-0 and interrupted running sutures x  # 3 , likewise with Nylon 3-0 to evert and efface/ align the wound edges.  Then a sterile dressing was applied.   Patient was instructed in post-op wound care.  Assessment and Plan:      Follow Up Instructions:        I discussed the assessment and treatment plan with the patient. The patient was provided an opportunity to ask questions and all were answered. The patient agreed with the plan and demonstrated an understanding of the instructions.       The patient was advised to call back or seek an in-person evaluation if the symptoms worsen or if the condition fails to improve as anticipated.    Kirtland Bouchard, MD

## 2021-07-10 ENCOUNTER — Ambulatory Visit: Payer: Medicare HMO | Admitting: Physical Therapy

## 2021-07-10 DIAGNOSIS — R293 Abnormal posture: Secondary | ICD-10-CM | POA: Diagnosis not present

## 2021-07-10 DIAGNOSIS — R2681 Unsteadiness on feet: Secondary | ICD-10-CM

## 2021-07-10 DIAGNOSIS — M546 Pain in thoracic spine: Secondary | ICD-10-CM | POA: Diagnosis not present

## 2021-07-10 DIAGNOSIS — M2560 Stiffness of unspecified joint, not elsewhere classified: Secondary | ICD-10-CM

## 2021-07-10 NOTE — Therapy (Signed)
[place], [place], [address] Phone: [phone]   Fax:  [phone]  Physical Therapy Treatment/Discharge  Patient Details  Name: Vanessa French MRN: [id number] Date of Birth: 1950-12-25 Referring Provider (PT): [name], NP   Encounter Date: 07/10/2021   PT End of Session - 07/10/21 1148     Visit Number 7    Number of Visits 12    Date for PT Re-Evaluation 07/22/21    Authorization Type Aetna MCR    PT Start Time 1173    PT Stop Time 1225    PT Time Calculation (min) 40 min    Activity Tolerance Patient tolerated treatment well    Behavior During Therapy [facility] for tasks assessed/performed             Past Medical History:  Diagnosis Date   Closed fracture of right proximal humerus 04/07/2012   Diverticulosis of colon 02/14/2020   Frequent UTI    GERD (gastroesophageal reflux disease)    History of pulmonary embolus (PE) 11/03/2012   Psoriasis    Thoracic compression fracture [facility])     Past Surgical History:  Procedure Laterality Date   APPENDECTOMY  1959   BLADDER SUSPENSION  11/1998   BREAST CYST ASPIRATION Right 2011   CATARACT EXTRACTION, BILATERAL Bilateral 2016   Dr. Vanessa [place] REPAIR  11/10/2018   Dr. Alona Bene, [place]  11/10/2018   Dr. Alona Bene   EXTENSOR TENDON OF FOREARM / WRIST REPAIR Left 2010   LEFT ARM   ORIF HUMERUS FRACTURE  04/07/2012   Procedure: OPEN REDUCTION INTERNAL FIXATION (ORIF) PROXIMAL HUMERUS FRACTURE;  Surgeon: Marybelle Killings, MD;  Location: [place];  Service: Orthopedics;  Laterality: Right;  Open Reduction Internal Fixation Right Proxmial Humerus   STERIOD INJECTION  04/07/2012   Procedure: STEROID INJECTION;  Surgeon: Marybelle Killings, MD;  Location: [place];  Service: Orthopedics;  Laterality: Right;   Right 1st Dorsal Compartment Injection   TONSILLECTOMY  1962   URETER REVISION Left 11/3003   COMPLIATION OF BLADDER SLING    vaginal hysterectomy with unilateral salpingooopherectomy  2000    There were no vitals filed for this visit.   Subjective Assessment - 07/10/21 1228     Subjective Pt doing OK.  Did not do exercises when on vacation. 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  Thinks she can be DC at this time. He rback usually only hurts when putting pressure on it.    Currently in Pain? No/denies                UBE 5 min , 3 min retro and 2 min forward  Standing  row blue band x 15  Extension x 15 , discussed varying foot position for balance challenge  Standing balance on Airex  Tandem hold each LE  Narrow static then Eyes closed , eyes open  Head turns and nods in tandem slow x 10 each     Supine LTR with Towel roll longitudinally passive stretching , breathing  Horizontal pull red x 15  Overhead narrow grip blue band x 10  Open book x 5 each side , min cues    PT Long Term Goals - 07/10/21 1150       PT LONG TERM GOAL #1   Title Pt will be I with HEP for posture, strength and balance    Status Achieved  PT LONG TERM GOAL #2   Title Pt will increase FOTO score to 56% or better to dmeo functional improvement. 07/02/21: 63% functional ability    Baseline 63%    Status Achieved      PT LONG TERM GOAL #3   Title Pt will be able to proper identify and demo safe lifting techniques for items below waist height to avoid injury.    Status Achieved      PT LONG TERM GOAL #4   Title Pt will improve her DGI score based on results , TBA      PT LONG TERM GOAL #5   Title Pt will reports being able to walk with husband at night with improved confidence    Status Achieved                   Plan - 07/10/21 1153     Clinical Impression Statement Patient understands she may always have a degree of pain . She has met her goals, feels more confident in her balance and exercises improve her awareness of posture. Did not retest DGI. She is stronger  in both arms than when she began PT.    PT Treatment/Interventions ADLs/Self Care Home Management;Therapeutic exercise;Patient/family education;Manual techniques;Passive range of motion;Taping;Dry needling;Balance training;Functional mobility training;Neuromuscular re-education;Moist Heat;DME Instruction;Cryotherapy;Gait training;Therapeutic activities    PT Next Visit Plan NA    PT Home Exercise Plan Access Code: [email or web address]    [email or web address] and Agree with Plan of Care Patient             Patient will benefit from skilled therapeutic intervention in order to improve the following deficits and impairments:  Decreased strength, Increased fascial restricitons, Impaired flexibility, Impaired UE functional use, Postural dysfunction, Pain, Decreased range of motion, Decreased mobility, Decreased balance  Visit Diagnosis: Pain in thoracic spine  Unsteadiness on feet  Abnormal posture  Joint stiffness of spine     Problem List Patient Active Problem List   Diagnosis Date Noted   Acute pulmonary embolism ([place]) 05/14/2021   Aortic atherosclerosis ([place])- CT 04/2021 05/14/2021   B12 deficiency 08/07/2020   Thoracic compression fracture ([place]) 07/30/2020   Hypertension 12/21/2018   GERD (gastroesophageal reflux disease) 07/13/2018   Psoriasis 12/15/2017   Medication management 12/14/2017   Osteopenia of multiple sites 12/14/2017   BMI 27.0-27.9,adult 12/14/2017   Hyperlipidemia 12/14/2017   Vitamin D deficiency 12/14/2017   OAB (overactive bladder) 11/30/2016   History of prediabetes 11/30/2016   Anxiety 11/03/2012    Latrell Potempa, PT 07/10/2021, 12:33 PM  [facility] [address] [place], [place], [address] Phone: [phone]   Fax:  [phone]  Name: Vanessa French MRN: [id number] Date of Birth: 04-26-1951   PHYSICAL THERAPY DISCHARGE SUMMARY  Visits from Start of Care: 7  Current functional level related to goals /  functional outcomes: See above    Remaining deficits: UE weakness, posture   Education / Equipment: HEP, posture, balance    Patient agrees to discharge. Patient goals were met. Patient is being discharged due to being pleased with the current functional level.   Raeford Razor, PT 07/10/21 12:37 PM Phone: [phone] Fax: [phone]

## 2021-07-12 NOTE — Patient Instructions (Signed)
Sutured Wound Care Sutures are stitches that can be used to close wounds. Taking care of your wound properly can help to prevent pain and infection. It can also help your wound to heal more quickly. Follow instructions from your health care provider about how to care for your sutured wound. Supplies needed: Soap and water. A clean bandage (dressing), if needed. Antibiotic ointment. A clean towel. How to care for your sutured wound Keep the wound completely dry for the first 24 hours, or for as long as directed by your health care provider. After 24-48 hours, you may shower or bathe as directed by your health care provider. Do not soak or submerge the wound in water until the sutures have been removed. After the first 24 hours, clean the wound once a day, or as often as directed by your health care provider, using the following steps: Wash the wound with soap and water. Rinse the wound with water to remove all soap. Pat the wound dry with a clean towel. Do not rub the wound. After cleaning the wound, apply a thin layer of antibiotic ointment as directed by your health care provider. This will prevent infection and keep the dressing from sticking to the wound. Follow instructions from your health care provider about how to change your dressing: Wash your hands with soap and water. If soap and water are not available, use hand sanitizer. Change your dressing at least once a day, or as often as told by your health care provider. If your dressing gets wet or dirty, change it. Leave sutures and other skin closures, such as adhesive tape or skin glue, in place. These skin closures may need to stay in place for 2 weeks or longer. If adhesive strip edges start to loosen and curl up, you may trim the loose edges. Do not remove adhesive strips completely unless your health care provider tells you to do that. Check your wound every day for signs of infection. Watch for: Redness, swelling, or pain. Fluid or  blood. Warmth. Pus or a bad smell. Have the sutures removed as directed by your health care provider. Follow these instructions at home: Medicines Take or apply over-the-counter and prescription medicines only as told by your health care provider. If you were prescribed an antibiotic medicine or ointment, take or apply it as told by your health care provider. Do not stop using the antibiotic even if your condition improves. General instructions To help reduce scarring after your wound heals, cover your wound with clothing or apply sunscreen of at least 30 SPF whenever you are outside. Do not scratch or pick at your wound. Avoid stretching your wound. Raise (elevate) the injured area above the level of your heart while you are sitting or lying down, if possible. Drink enough fluids to keep your urine clear or pale yellow. Keep all follow-up visits as told by your health care provider. This is important. Contact a health care provider if: You received a tetanus shot and you have swelling, severe pain, redness, or bleeding at the injection site. Your wound breaks open. You have redness, swelling, or pain around your wound. You have fluid or blood coming from your wound. Your wound feels warm to the touch. You have a fever. You notice something coming out of your wound, such as wood or glass. You have pain that does not get better with medicine. The skin near your wound changes color. You need to change your dressing very frequently due to a lot of  fluid, blood, or pus draining from the wound. You develop a new rash. You develop numbness around the wound. Get help right away if: You develop severe swelling around your wound. You have pus or a bad smell coming from your wound. Your pain suddenly gets worse and is severe. You develop painful lumps near your wound or anywhere on your body. You have a red streak going away from your wound. The wound is on your hand or foot and: You cannot  properly move a finger or toe. Your fingers or toes look pale or bluish. You have numbness that is spreading down your hand, foot, fingers, or toes. Summary Sutures are stitches that can be used to close wounds. Taking care of your wound properly can help to prevent pain and infection. Keep the wound completely dry for the first 24 hours, or for as long as directed by your health care provider. After 24-48 hours, you may shower or bathe as directed by your health care provider. This information is not intended to replace advice given to you by your health care provider. Make sure you discuss any questions you have with your health care provider. Document Revised: 08/07/2020 Document Reviewed: 08/08/2020 Elsevier Patient Education  2022 [employer].

## 2021-07-16 DIAGNOSIS — H43813 Vitreous degeneration, bilateral: Secondary | ICD-10-CM | POA: Diagnosis not present

## 2021-07-16 DIAGNOSIS — H353122 Nonexudative age-related macular degeneration, left eye, intermediate dry stage: Secondary | ICD-10-CM | POA: Diagnosis not present

## 2021-07-16 DIAGNOSIS — H43393 Other vitreous opacities, bilateral: Secondary | ICD-10-CM | POA: Diagnosis not present

## 2021-07-16 DIAGNOSIS — H353211 Exudative age-related macular degeneration, right eye, with active choroidal neovascularization: Secondary | ICD-10-CM | POA: Diagnosis not present

## 2021-07-16 LAB — HM DIABETES EYE EXAM

## 2021-07-20 ENCOUNTER — Other Ambulatory Visit: Payer: Self-pay

## 2021-07-20 DIAGNOSIS — R609 Edema, unspecified: Secondary | ICD-10-CM

## 2021-07-20 MED ORDER — HYDROCHLOROTHIAZIDE 25 MG PO TABS: ORAL_TABLET | ORAL | 1 refills | Status: DC

## 2021-07-22 ENCOUNTER — Other Ambulatory Visit: Payer: Self-pay

## 2021-07-22 MED ORDER — FAMOTIDINE 20 MG PO TABS: ORAL_TABLET | ORAL | 1 refills | Status: DC

## 2021-07-29 ENCOUNTER — Other Ambulatory Visit: Payer: Self-pay | Admitting: Adult Health

## 2021-08-07 NOTE — Progress Notes (Signed)
CPE  Assessment and Plan:  Encounter for Annual Physical Exam with abnormal findings Due annually  Health Maintenance reviewed Healthy lifestyle reviewed and goals set  Atherosclerosis of aorta ([place]) per CT 04/2021 Control blood pressure, cholesterol, glucose, increase exercise.   Other acute pulmonary embolism without acute cor pulmonale (HCC) Sx resolved; very small PE in provoked setting Continue xarelto x 3 full months then taper  Restart ASA after completion  Hypertension Continue medication Monitor blood pressure at home; call if consistently over 130/80 Continue DASH diet.   Reminder to go to the ER if any CP, SOB, nausea, dizziness, severe HA, changes vision/speech, left arm numbness and tingling and jaw pain.  OAB (overactive bladder) Improved s/p repair of bladder prolapse.  She tapered off of myrbetriq and feels managing well at this time Wears 1-2 liners/day  Anxiety Well managed by current regimen; continue medications;  Will try tapering cymbalta per patient preference. 30 mg caps sent in -  Stress management techniques discussed, increase water, good sleep hygiene discussed, increase exercise, and increase veggies.   Hx of prediabetes Discussed disease and risks of elevated glucose Discussed diet/exercise, weight management  Check A1C annually; monitor serum glucose    Overweight- BMI 28 Commended her on her excellent progress with 50 lb weight loss in last few years  She is maintaining well  Recommended diet heavy in fruits and veggies and low in animal meats, cheeses, and dairy products, appropriate calorie intake Exercise encouraged; encouraged resistance exercises Follow up at next visit  Hyperipidemia Mild elevation currently well controlled by lifestyle Low risk history otherwise; patient preference to defer med Continue low cholesterol diet and exercise.  Check lipid panel  Vitamin D deficiency -     VITAMIN D 25 Hydroxy (Vit-D Deficiency,  Fractures)  Medication management -     CBC with Differential/Platelet -     CMP/GFR -     magnesium  Osteopenia of multiple sites DEXA due 2023 continue vit D and calcium supplements Back on fosamax since last check due to fracture per Dr. Lorin Mercy Previously on 18 months on 6 months off schedule per ortho recommendation  Psoriasis Previously treated by methotrexate but has been tapered off by derm Dr. Jari Pigg; doing well with topical steroids  GERD Controlled sx; continue meds Discussed diet, avoiding triggers and other lifestyle changes  Thoracic compression fracture ([place]) Dr. Lorin Mercy following, kyphoplasty not recommended Chronic; has been 6+ months  Macular degeneration Opth following, injections on R No visual barriers to ADLs  B12 deficiency - check levels  Left wrist mass L ganglion cyst recurrent s/p resection by Dr. Melford Aase, however some concern with non-transillumination today. Discussed and proceed with hand specialist evaluation for further recommendations.   Need for influenza vaccine High dose quadrivalent administered without complication today    Orders Placed This Encounter  Procedures   CBC with Differential/Platelet   COMPLETE METABOLIC PANEL WITH GFR   Magnesium   Lipid panel   TSH   Hemoglobin A1c   VITAMIN D 25 Hydroxy (Vit-D Deficiency, Fractures)   Microalbumin / creatinine urine ratio   Urinalysis, Routine w reflex microscopic   Vitamin B12   Ambulatory referral to Hand Surgery   EKG 12-Lead    Discussed med's effects and SE's. Screening labs and tests as requested with regular follow-up as recommended. Over 40 minutes of exam, counseling, chart review, and complex, high level critical decision making was performed this visit.   Future Appointments  Date Time Provider [place]  04/26/2022 11:00 AM [name], NP [facility] None  08/11/2022  9:00 AM [name], NP [facility] None    Plan:   During the course of  the visit the patient was educated and counseled about appropriate screening and preventive services including:   Pneumococcal vaccine  Prevnar 13 Influenza vaccine Td vaccine Screening electrocardiogram Bone densitometry screening Colorectal cancer screening Diabetes screening Glaucoma screening Nutrition counseling  Advanced directives: requested    HPI  BP 102/76   Pulse (!) 102   Temp 97.7 F (36.5 C)   Ht 5\' 3"  (1.6 m)   Wt 162 lb (73.5 kg)   SpO2 99%   BMI 28.70 kg/m   70 y.o. female  presents for a complete physical and follow up for has Anxiety; OAB (overactive bladder); History of prediabetes; Medication management; Osteopenia of multiple sites; BMI 27.0-27.9,adult; Hyperlipidemia; Vitamin D deficiency; Psoriasis; GERD (gastroesophageal reflux disease); Essential hypertension; Thoracic compression fracture ([place]); B12 deficiency; Acute pulmonary embolism [facility]) Post Covid - July 2022; Aortic atherosclerosis ([place])- CT 04/2021; Macular degeneration of both eyes; and Mass of left wrist on their problem list.   She is married, 3 sons, 2 grandchildren. Newly retired Therapist, sports from our office.  Husband has Parkinson's and somewhat progressing.   She has left wrist nodule x 01/2021, transilluminated at initial evaluation, underwent excision by Dr. Melford Aase on 07/02/2021 with mild infection following, completed course of keflex and resolved. However today presents with recurrent nodule without weakness/numbness, however not transilluminating.   Hx of provoked PE after humerus fracture without recurrence after tapering off of coumadin for many years until recently 04/2021 developed exertional dyspnea and tachycardia following recovery from covid 19, + d dimer on 05/13/2021, CTA showed small LUL PE on 05/14/2021, started on Xarelto 20 mg daily with sx resolved, plan to taper after 3  months.   She is followed by Dr. Jari Pigg for psoriasis previously on methotrexate, currently off and doing well  with topical steroids PRN.   She is on vitamin D and calcium supplements. She did have fall down stairs at home in Sept 2021, has been found to have thoracic compression fractures - T1 and T11 new, Old T3 and T12 fracture. Has seen Dr. Lorin Mercy who did not recommend kyphoplasty. She has hx of osteoporosis, currently back on fosamax since fall 2021; taking intermittently since ?2013 since humerus fracture; most recent DEXA in 09/2020 demonstrated L fem T -2.0, alendronate was restarted by Dr. Lorin Mercy.   Has hx of vaginal hysterectomy and had  bladder sling in 2000; had frequent UTIs in the past with cystocele and was referred to Dr. Alona Bene with [facility] and underwent anterior cystocele repair per his recommendation.  Wears a pad (2 during the day while at work). Medications were not helpful.   She has been prescribed zoloft 25 mg daily for many years but was changed to cymbalta 60 for pain with benefit. Also recently underwent PT with perceived benefit.   she has a diagnosis of GERD which is currently managed by famotidine 20 mg AM and omeprazole 20 mg PM daily.   BMI is Body mass index is 28.7 kg/m., she is working on diet and exercise. She successfully lost over 50 lb with phentermine and has maintained. She is walking regularly for exercise.  Wt Readings from Last 3 Encounters:  08/11/21 162 lb (73.5 kg)  07/02/21 164 lb (74.4 kg)  06/09/21 162 lb (73.5 kg)   Her blood pressure has been controlled at home, today their BP  is BP: 102/76 She does workout. She denies chest pain, shortness of breath, dizziness.   Recent CT 05/14/2021 showed aortic atherosclerosis.   She is not on cholesterol medication and denies myalgias. Her cholesterol is not at goal due to low risk history and mild elevations and patiet preference. No strong family hx of CVD. The cholesterol last visit was:   Lab Results  Component Value Date   CHOL 184 04/24/2021   HDL 66 04/24/2021   LDLCALC 103 (H) 04/24/2021   TRIG 60  04/24/2021   CHOLHDL 2.8 04/24/2021   She has been working on diet and exercise for hx of prediabetes, she is on bASA, she is not on ACE/ARB and denies foot ulcerations, increased appetite, nausea, paresthesia of the feet, polydipsia, polyuria, visual disturbances and vomiting. Last A1C in the office was:  Lab Results  Component Value Date   HGBA1C 5.4 08/06/2020   Last GFR: Lab Results  Component Value Date   GFRNONAA 97 04/24/2021   Patient is on Vitamin D supplement, has reduced to alternating 5000 and 10000 IU.  Lab Results  Component Value Date   VD25OH 112 (H) 04/24/2021     She is newly on B12 SL, unsure of dose, 3-4 days/week.  Lab Results  Component Value Date   [id number] 503 08/06/2020     Current Medications:  Current Outpatient Medications on File Prior to Visit  Medication Sig Dispense Refill   alendronate (FOSAMAX) 70 MG tablet TAKE 1 TABLET BY MOUTH ONCE WEEKLY ON AN EMPTY STOMACH BEFORE BREAKFAST. REMAIN UPRIGHT FOR 30 MINUTES & TAKE WITH 8 OUNCES OF WATER 12 tablet 3   Ascorbic Acid (VITAMIN C/ROSE HIPS PO) Take 500 mg by mouth daily.     Bevacizumab (AVASTIN) 100 MG/4ML SOLN Inject into the vein. One injection once a month     calcipotriene (DOVONOX) 0.005 % cream Apply 1 application topically at bedtime.     calcium carbonate (TUMS EX) 750 MG chewable tablet Chew 1 tablet by mouth daily.     cetirizine (ZYRTEC) 10 MG tablet Take 10 mg by mouth as needed for allergies.     Cholecalciferol (VITAMIN D-3) 125 MCG (5000 UT) TABS Alternate taking 1 and 2 caps every other day. 30 tablet 0   CINNAMON PO Take 1,000 mg by mouth daily.     CRANBERRY PO Take by mouth. Take 2 caplets BID     DULoxetine (CYMBALTA) 60 MG capsule Take  1 capsule  Daily  for Mood & Chronic Back Pain 90 capsule 3   famotidine (PEPCID) 20 MG tablet TAKE ONE TABLET BY MOUTH TWICE A DAY FOR INDIGESTION AND HEARTBURN 180 tablet 1   hydrochlorothiazide (HYDRODIURIL) 25 MG tablet TAKE 1/2 - 1  TABLET BY MOUTH AS NEEDED ONLY FOR BLOOD PRESSURE AND FLUID RETENTION/ ANKLE SWELLING 90 tablet 1   Methylcobalamin (B12 SL) Place 1 tablet under the tongue See admin instructions. 3-4 days/week     Multiple Vitamin (MULTI-VITAMIN DAILY PO) Take 1 tablet by mouth daily.     omeprazole (PRILOSEC) 20 MG capsule Take 20 mg by mouth at bedtime.     rivaroxaban (XARELTO) 20 MG TABS tablet Take 1 tablet Daily to Prevent Blood Clots 90 tablet 1   triamcinolone cream (KENALOG) 0.1 % Apply 1 application topically 2 (two) times daily. (Patient taking differently: Apply 1 application topically as needed.) 80 g 1   No current facility-administered medications on file prior to visit.   Allergies:  Allergies  Allergen Reactions  Penicillins Rash   Latex Rash   Medical History:  She has Anxiety; OAB (overactive bladder); History of prediabetes; Medication management; Osteopenia of multiple sites; BMI 27.0-27.9,adult; Hyperlipidemia; Vitamin D deficiency; Psoriasis; GERD (gastroesophageal reflux disease); Essential hypertension; Thoracic compression fracture ([place]); B12 deficiency; Acute pulmonary embolism [facility]) Post Covid - July 2022; Aortic atherosclerosis ([place])- CT 04/2021; Macular degeneration of both eyes; and Mass of left wrist on their problem list. Health Maintenance:   Immunization History  Administered Date(s) Administered   Hepatitis B 01/11/2013   Influenza Split 07/25/2012, 08/12/2014, 08/21/2015   Influenza, High Dose Seasonal PF 08/02/2017, 07/13/2018, 08/03/2019, 08/06/2020   Influenza,inj,quad, With Preservative 08/13/2016   PFIZER(Purple Top)SARS-COV-2 Vaccination 12/23/2019, 01/16/2020, 09/20/2020   PPD Test 07/31/2014, 07/28/2015, 08/13/2016, 07/13/2018   Pneumococcal Conjugate-13 12/15/2017   Pneumococcal Polysaccharide-23 04/16/2002   Tdap 12/21/2011   Zoster, Live 10/26/2003   Preventative care: Tetanus: 2014 Pneumovax: 1996, 2003 Prevnar 13: 2019 Flu vaccine: 2021,  TODAY Zostavax: 2005, declines shingrix Covid 19: 2/2, 2021 + 1 booster  Pap: S/p hysterectomy, last pelvic 2020 [facility].  Last colonoscopy: 08/27/2019, 5 year follow up, family history  Mammogram: 10/2020 Dexa: 07/2020 T-2.0 radius, Dr. Lorin Mercy restarted alendronate due to fractures  Names of Other Physician/Practitioners you currently use: 1. [facility] here for primary care 2. Dr. Prudencio Burly, eye doctor, last visit 05/2021, macular degeneration, seeing Dr. Clovia Cuff, R is wet, L dry, getting injections 3. Dr. Barrie Dunker, dentist, last visit 04/2021, goes q51m 3. Dr. Delman Cheadle, derm, psoriasis, now seeing annually 10/2020  Patient Care Team: Unk Pinto, MD as PCP - General (Internal Medicine) Jari Pigg, MD as Consulting Physician (Dermatology) Marybelle Killings, MD as Consulting Physician (Orthopedic Surgery)  Surgical History:  She has a past surgical history that includes Tonsillectomy (1962); Appendectomy (1959); Bladder suspension (11/1998); Ureter revision (Left, 11/3003); Extensor tendon of forearm / wrist repair (Left, 2010); ORIF humerus fracture (04/07/2012); Steriod injection (04/07/2012); Breast cyst aspiration (Right, 2011); vaginal hysterectomy with unilateral salpingooopherectomy (2000); Cataract extraction, bilateral (Bilateral, 2016); Cystocele repair (11/10/2018); and Cystocele repair (11/10/2018). Family History:  Herfamily history includes Atrial fibrillation in her sister; Colon cancer in her maternal uncle; Colon cancer (age of onset: 40) in her father; Heart disease in her maternal grandfather and mother; Heart failure (age of onset: 9) in her maternal grandfather; Kidney Stones in her son. Social History:  She reports that she has never smoked. She has never used smokeless tobacco. She reports that she does not drink alcohol and does not use drugs.   Review of Systems: Review of Systems  Constitutional:  Negative for  malaise/fatigue and weight loss.  HENT:  Negative for hearing loss and tinnitus.   Eyes:  Negative for blurred vision and double vision.  Respiratory:  Negative for cough, shortness of breath and wheezing.   Cardiovascular:  Negative for chest pain, palpitations, orthopnea, claudication and leg swelling.  Gastrointestinal:  Negative for abdominal pain, blood in stool, constipation, diarrhea, heartburn, melena, nausea and vomiting.  Genitourinary:  Positive for frequency (chronic). Negative for dysuria, flank pain, hematuria and urgency.  Musculoskeletal:  Positive for back pain (thoracic). Negative for joint pain and myalgias.  Skin:  Negative for rash.  Neurological:  Negative for dizziness, tingling, sensory change, weakness and headaches.  Endo/Heme/Allergies:  Negative for polydipsia.  Psychiatric/Behavioral: Negative.    All other systems reviewed and are negative.  Physical Exam: Estimated body mass index is 28.7 kg/m as calculated from the following:  Height as of this encounter: 5\' 3"  (1.6 m).   Weight as of this encounter: 162 lb (73.5 kg). BP 102/76   Pulse (!) 102   Temp 97.7 F (36.5 C)   Ht 5\' 3"  (1.6 m)   Wt 162 lb (73.5 kg)   SpO2 99%   BMI 28.70 kg/m  General Appearance: Well nourished, in no apparent distress.  Eyes: PERRLA, EOMs, conjunctiva no swelling or erythema; fundal exam deferred to ophth Sinuses: No Frontal/maxillary tenderness  ENT/Mouth: Ext aud canals clear, normal light reflex with TMs without erythema, bulging. Good dentition. No erythema, swelling, or exudate on post pharynx. Tonsils not swollen or erythematous. Hearing normal.  Neck: Supple, thyroid normal. No bruits  Respiratory: Respiratory effort normal, BS equal bilaterally without rales, rhonchi, wheezing or stridor.  Cardio: RRR without murmurs, rubs or gallops. No carotid or abdominal aortic bruit. Brisk peripheral pulses without edema.  Chest: symmetric, with normal excursions. Breasts:  Patient declines, recent mammogram Abdomen: Soft, nontender, no guarding, rebound, hernias, masses, or organomegaly.  Lymphatics: Non tender without lymphadenopathy.  Genitourinary: Deferred Musculoskeletal: Full ROM all peripheral extremities,5/5 strength, and normal gait. Mild/mod kyphosis, irregular but non-tender spinous processes. Let wrist with approx 1 cm smooth mobile nodule to palmar radial wrist, non-transilluminating.  Skin: Warm, dry without lesions, ecchymosis. She has mild petichal rash annularly around R ankle. Very small plaque to R lateral ankle.  Neuro: Cranial nerves intact, reflexes equal bilaterally. Normal muscle tone, no cerebellar symptoms. Sensation intact.  Psych: Awake and oriented X 3, normal affect, Insight and Judgment appropriate.   EKG: NRS, NSCPT  Izora Ribas, NP 9:48 AM [facility]

## 2021-08-11 ENCOUNTER — Other Ambulatory Visit: Payer: Self-pay

## 2021-08-11 ENCOUNTER — Ambulatory Visit (INDEPENDENT_AMBULATORY_CARE_PROVIDER_SITE_OTHER): Payer: Medicare HMO | Admitting: Adult Health

## 2021-08-11 ENCOUNTER — Encounter: Payer: Self-pay | Admitting: Adult Health

## 2021-08-11 VITALS — BP 102/76 | HR 102 | Temp 97.7°F | Ht 63.0 in | Wt 162.0 lb

## 2021-08-11 DIAGNOSIS — I10 Essential (primary) hypertension: Secondary | ICD-10-CM

## 2021-08-11 DIAGNOSIS — Z23 Encounter for immunization: Secondary | ICD-10-CM

## 2021-08-11 DIAGNOSIS — R7309 Other abnormal glucose: Secondary | ICD-10-CM | POA: Diagnosis not present

## 2021-08-11 DIAGNOSIS — M67432 Ganglion, left wrist: Secondary | ICD-10-CM | POA: Insufficient documentation

## 2021-08-11 DIAGNOSIS — E538 Deficiency of other specified B group vitamins: Secondary | ICD-10-CM | POA: Diagnosis not present

## 2021-08-11 DIAGNOSIS — Z131 Encounter for screening for diabetes mellitus: Secondary | ICD-10-CM

## 2021-08-11 DIAGNOSIS — N3281 Overactive bladder: Secondary | ICD-10-CM

## 2021-08-11 DIAGNOSIS — I700 Atherosclerosis of aorta: Secondary | ICD-10-CM

## 2021-08-11 DIAGNOSIS — M8589 Other specified disorders of bone density and structure, multiple sites: Secondary | ICD-10-CM

## 2021-08-11 DIAGNOSIS — Z6827 Body mass index (BMI) 27.0-27.9, adult: Secondary | ICD-10-CM

## 2021-08-11 DIAGNOSIS — Z1389 Encounter for screening for other disorder: Secondary | ICD-10-CM | POA: Diagnosis not present

## 2021-08-11 DIAGNOSIS — N390 Urinary tract infection, site not specified: Secondary | ICD-10-CM | POA: Diagnosis not present

## 2021-08-11 DIAGNOSIS — Z0001 Encounter for general adult medical examination with abnormal findings: Secondary | ICD-10-CM | POA: Diagnosis not present

## 2021-08-11 DIAGNOSIS — E785 Hyperlipidemia, unspecified: Secondary | ICD-10-CM

## 2021-08-11 DIAGNOSIS — Z0000 Encounter for general adult medical examination without abnormal findings: Secondary | ICD-10-CM

## 2021-08-11 DIAGNOSIS — S22080S Wedge compression fracture of T11-T12 vertebra, sequela: Secondary | ICD-10-CM

## 2021-08-11 DIAGNOSIS — Z87898 Personal history of other specified conditions: Secondary | ICD-10-CM

## 2021-08-11 DIAGNOSIS — K219 Gastro-esophageal reflux disease without esophagitis: Secondary | ICD-10-CM

## 2021-08-11 DIAGNOSIS — Z1329 Encounter for screening for other suspected endocrine disorder: Secondary | ICD-10-CM | POA: Diagnosis not present

## 2021-08-11 DIAGNOSIS — E559 Vitamin D deficiency, unspecified: Secondary | ICD-10-CM

## 2021-08-11 DIAGNOSIS — L409 Psoriasis, unspecified: Secondary | ICD-10-CM

## 2021-08-11 DIAGNOSIS — I2699 Other pulmonary embolism without acute cor pulmonale: Secondary | ICD-10-CM

## 2021-08-11 DIAGNOSIS — Z79899 Other long term (current) drug therapy: Secondary | ICD-10-CM | POA: Diagnosis not present

## 2021-08-11 DIAGNOSIS — F419 Anxiety disorder, unspecified: Secondary | ICD-10-CM

## 2021-08-11 DIAGNOSIS — H3530 Unspecified macular degeneration: Secondary | ICD-10-CM | POA: Insufficient documentation

## 2021-08-11 DIAGNOSIS — Z136 Encounter for screening for cardiovascular disorders: Secondary | ICD-10-CM | POA: Diagnosis not present

## 2021-08-11 DIAGNOSIS — R2232 Localized swelling, mass and lump, left upper limb: Secondary | ICD-10-CM | POA: Insufficient documentation

## 2021-08-11 DIAGNOSIS — E782 Mixed hyperlipidemia: Secondary | ICD-10-CM | POA: Diagnosis not present

## 2021-08-11 HISTORY — DX: Ganglion, left wrist: M67.432

## 2021-08-11 NOTE — Addendum Note (Signed)
Addended by: Chancy Hurter on: 08/11/2021 10:40 AM   Modules accepted: Orders

## 2021-08-11 NOTE — Patient Instructions (Addendum)
Vanessa French , Thank you for taking time to come for your Annual Wellness Visit. I appreciate your ongoing commitment to your health goals. Please review the following plan we discussed and let me know if I can assist you in the future.   These are the goals we discussed:  Goals      Blood Pressure < 130/80     LDL CALC < 100     Weight (lb) < 165 lb (74.8 kg)     Weights/resistance exercises 2-3x per week        This is a list of the screening recommended for you and due dates:  Health Maintenance  Topic Date Due   Zoster (Shingles) Vaccine (1 of 2) Never done   COVID-19 Vaccine (4 - Booster for Pfizer series) 12/13/2020   Flu Shot  05/25/2021   Mammogram  10/31/2022   Tetanus Vaccine  08/05/2023   Colon Cancer Screening  08/26/2029   DEXA scan (bone density measurement)  Completed   Hepatitis C Screening: USPSTF Recommendation to screen - Ages 63-79 yo.  Completed   HPV Vaccine  Aged Out     High-Fiber Eating Plan Fiber, also called dietary fiber, is a type of carbohydrate. It is found foods such as fruits, vegetables, whole grains, and beans. A high-fiber diet can have many health benefits. Your health care provider may recommend a high-fiber diet to help: Prevent constipation. Fiber can make your bowel movements more regular. Lower your cholesterol. Relieve the following conditions: Inflammation of veins in the anus (hemorrhoids). Inflammation of specific areas of the digestive tract (uncomplicated diverticulosis). A problem of the large intestine, also called the colon, that sometimes causes pain and diarrhea (irritable bowel syndrome, or IBS). Prevent overeating as part of a weight-loss plan. Prevent heart disease, type 2 diabetes, and certain cancers. What are tips for following this plan? Reading food labels  Check the nutrition facts label on food products for the amount of dietary fiber. Choose foods that have 5 grams of fiber or more per serving. The goals for  recommended daily fiber intake include: Men (age 28 or younger): 34-38 g. Men (over age 2): 28-34 g. Women (age 73 or younger): 25-28 g. Women (over age 61): 22-25 g. Your daily fiber goal is _____________ g. Shopping Choose whole fruits and vegetables instead of processed forms, such as apple juice or applesauce. Choose a wide variety of high-fiber foods such as avocados, lentils, oats, and kidney beans. Read the nutrition facts label of the foods you choose. Be aware of foods with added fiber. These foods often have high sugar and sodium amounts per serving. Cooking Use whole-grain flour for baking and cooking. Cook with brown rice instead of white rice. Meal planning Start the day with a breakfast that is high in fiber, such as a cereal that contains 5 g of fiber or more per serving. Eat breads and cereals that are made with whole-grain flour instead of refined flour or white flour. Eat brown rice, bulgur wheat, or millet instead of white rice. Use beans in place of meat in soups, salads, and pasta dishes. Be sure that half of the grains you eat each day are whole grains. General information You can get the recommended daily intake of dietary fiber by: Eating a variety of fruits, vegetables, grains, nuts, and beans. Taking a fiber supplement if you are not able to take in enough fiber in your diet. It is better to get fiber through food than from a supplement.  Gradually increase how much fiber you consume. If you increase your intake of dietary fiber too quickly, you may have bloating, cramping, or gas. Drink plenty of water to help you digest fiber. Choose high-fiber snacks, such as berries, raw vegetables, nuts, and popcorn. What foods should I eat? Fruits Berries. Pears. Apples. Oranges. Avocado. Prunes and raisins. Dried figs. Vegetables Sweet potatoes. Spinach. Kale. Artichokes. Cabbage. Broccoli. Cauliflower. Green peas. Carrots. Squash. Grains Whole-grain breads. Multigrain  cereal. Oats and oatmeal. Brown rice. Barley. Bulgur wheat. [place]. Quinoa. Bran muffins. Popcorn. Rye wafer crackers. Meats and other proteins Navy beans, kidney beans, and pinto beans. Soybeans. Split peas. Lentils. Nuts and seeds. Dairy Fiber-fortified yogurt. Beverages Fiber-fortified soy milk. Fiber-fortified orange juice. Other foods Fiber bars. The items listed above may not be a complete list of recommended foods and beverages. Contact a dietitian for more information. What foods should I avoid? Fruits Fruit juice. Cooked, strained fruit. Vegetables Fried potatoes. Canned vegetables. Well-cooked vegetables. Grains White bread. Pasta made with refined flour. White rice. Meats and other proteins Fatty cuts of meat. Fried chicken or fried fish. Dairy Milk. Yogurt. Cream cheese. Sour cream. Fats and oils Butters. Beverages Soft drinks. Other foods Cakes and pastries. The items listed above may not be a complete list of foods and beverages to avoid. Talk with your dietitian about what choices are best for you. Summary Fiber is a type of carbohydrate. It is found in foods such as fruits, vegetables, whole grains, and beans. A high-fiber diet has many benefits. It can help to prevent constipation, lower blood cholesterol, aid weight loss, and reduce your risk of heart disease, diabetes, and certain cancers. Increase your intake of fiber gradually. Increasing fiber too quickly may cause cramping, bloating, and gas. Drink plenty of water while you increase the amount of fiber you consume. The best sources of fiber include whole fruits and vegetables, whole grains, nuts, seeds, and beans. This information is not intended to replace advice given to you by your health care provider. Make sure you discuss any questions you have with your health care provider. Document Revised: 02/14/2020 Document Reviewed: 02/14/2020 Elsevier Patient Education  2022 [employer].

## 2021-08-12 LAB — CBC WITH DIFFERENTIAL/PLATELET
Absolute Monocytes: 454 cells/uL (ref 200–950)
Basophils Absolute: 50 cells/uL (ref 0–200)
Basophils Relative: 0.9 %
Eosinophils Absolute: 112 cells/uL (ref 15–500)
Eosinophils Relative: 2 %
HCT: 47.5 % — ABNORMAL HIGH (ref 35.0–45.0)
Hemoglobin: 15.6 g/dL — ABNORMAL HIGH (ref 11.7–15.5)
Lymphs Abs: 2089 cells/uL (ref 850–3900)
MCH: 29.9 pg (ref 27.0–33.0)
MCHC: 32.8 g/dL (ref 32.0–36.0)
MCV: 91.2 fL (ref 80.0–100.0)
MPV: 9.8 fL (ref 7.5–12.5)
Monocytes Relative: 8.1 %
Neutro Abs: 2895 cells/uL (ref 1500–7800)
Neutrophils Relative %: 51.7 %
Platelets: 220 10*3/uL (ref 140–400)
RBC: 5.21 10*6/uL — ABNORMAL HIGH (ref 3.80–5.10)
RDW: 11.8 % (ref 11.0–15.0)
Total Lymphocyte: 37.3 %
WBC: 5.6 10*3/uL (ref 3.8–10.8)

## 2021-08-12 LAB — URINALYSIS, ROUTINE W REFLEX MICROSCOPIC
Bilirubin Urine: NEGATIVE
Glucose, UA: NEGATIVE
Hyaline Cast: NONE SEEN /LPF
Ketones, ur: NEGATIVE
Nitrite: NEGATIVE
Protein, ur: NEGATIVE
Specific Gravity, Urine: 1.013 (ref 1.001–1.035)
pH: 7 (ref 5.0–8.0)

## 2021-08-12 LAB — COMPLETE METABOLIC PANEL WITH GFR
AG Ratio: 1.4 (calc) (ref 1.0–2.5)
ALT: 8 U/L (ref 6–29)
AST: 12 U/L (ref 10–35)
Albumin: 4.2 g/dL (ref 3.6–5.1)
Alkaline phosphatase (APISO): 64 U/L (ref 37–153)
BUN: 12 mg/dL (ref 7–25)
CO2: 31 mmol/L (ref 20–32)
Calcium: 9.2 mg/dL (ref 8.6–10.4)
Chloride: 100 mmol/L (ref 98–110)
Creat: 0.56 mg/dL (ref 0.50–1.05)
Globulin: 3.1 g/dL (calc) (ref 1.9–3.7)
Glucose, Bld: 101 mg/dL — ABNORMAL HIGH (ref 65–99)
Potassium: 3.9 mmol/L (ref 3.5–5.3)
Sodium: 139 mmol/L (ref 135–146)
Total Bilirubin: 0.7 mg/dL (ref 0.2–1.2)
Total Protein: 7.3 g/dL (ref 6.1–8.1)
eGFR: 99 mL/min/{1.73_m2} (ref >=60)

## 2021-08-12 LAB — MICROALBUMIN / CREATININE URINE RATIO
Creatinine, Urine: 69 mg/dL (ref 20–275)
Microalb Creat Ratio: 41 mcg/mg creat — ABNORMAL HIGH (ref <30)
Microalb, Ur: 2.8 mg/dL

## 2021-08-12 LAB — HEMOGLOBIN A1C
Hgb A1c MFr Bld: 5.3 % of total Hgb (ref <5.7)
Mean Plasma Glucose: 105 mg/dL
eAG (mmol/L): 5.8 mmol/L

## 2021-08-12 LAB — LIPID PANEL
Cholesterol: 189 mg/dL (ref <200)
HDL: 60 mg/dL (ref >=50)
LDL Cholesterol (Calc): 112 mg/dL (calc) — ABNORMAL HIGH
Non-HDL Cholesterol (Calc): 129 mg/dL (calc) (ref <130)
Total CHOL/HDL Ratio: 3.2 (calc) (ref <5.0)
Triglycerides: 78 mg/dL (ref <150)

## 2021-08-12 LAB — VITAMIN D 25 HYDROXY (VIT D DEFICIENCY, FRACTURES): Vit D, 25-Hydroxy: 107 ng/mL — ABNORMAL HIGH (ref 30–100)

## 2021-08-12 LAB — VITAMIN B12: Vitamin B-12: 687 pg/mL (ref 200–1100)

## 2021-08-12 LAB — MAGNESIUM: Magnesium: 2.2 mg/dL (ref 1.5–2.5)

## 2021-08-12 LAB — MICROSCOPIC MESSAGE

## 2021-08-12 LAB — TSH: TSH: 1.55 mIU/L (ref 0.40–4.50)

## 2021-08-20 ENCOUNTER — Other Ambulatory Visit: Payer: Medicare HMO

## 2021-08-20 ENCOUNTER — Other Ambulatory Visit: Payer: Self-pay

## 2021-08-20 ENCOUNTER — Other Ambulatory Visit: Payer: Self-pay | Admitting: Internal Medicine

## 2021-08-20 DIAGNOSIS — N390 Urinary tract infection, site not specified: Secondary | ICD-10-CM

## 2021-08-20 MED ORDER — NITROFURANTOIN MONOHYD MACRO 100 MG PO CAPS: ORAL_CAPSULE | ORAL | 0 refills | Status: DC

## 2021-08-24 ENCOUNTER — Other Ambulatory Visit: Payer: Self-pay | Admitting: Internal Medicine

## 2021-08-24 DIAGNOSIS — N390 Urinary tract infection, site not specified: Secondary | ICD-10-CM

## 2021-08-24 DIAGNOSIS — M25532 Pain in left wrist: Secondary | ICD-10-CM | POA: Diagnosis not present

## 2021-08-24 DIAGNOSIS — M25832 Other specified joint disorders, left wrist: Secondary | ICD-10-CM | POA: Diagnosis not present

## 2021-08-24 DIAGNOSIS — M67432 Ganglion, left wrist: Secondary | ICD-10-CM | POA: Diagnosis not present

## 2021-08-24 LAB — URINALYSIS, ROUTINE W REFLEX MICROSCOPIC
Bacteria, UA: NONE SEEN /HPF
Bilirubin Urine: NEGATIVE
Glucose, UA: NEGATIVE
Hyaline Cast: NONE SEEN /LPF
Ketones, ur: NEGATIVE
Nitrite: NEGATIVE
Protein, ur: NEGATIVE
Specific Gravity, Urine: 1.005 (ref 1.001–1.035)
Squamous Epithelial / HPF: NONE SEEN /HPF (ref <=5)
pH: 6.5 (ref 5.0–8.0)

## 2021-08-24 LAB — MICROSCOPIC MESSAGE

## 2021-08-24 LAB — URINE CULTURE
MICRO NUMBER:: 12560877
SPECIMEN QUALITY:: ADEQUATE

## 2021-08-24 MED ORDER — NITROFURANTOIN MONOHYD MACRO 100 MG PO CAPS: ORAL_CAPSULE | ORAL | 0 refills | Status: DC

## 2021-08-24 NOTE — Progress Notes (Signed)
============================================================ ============================================================  -   Urine culture did finally return growing Enterococcus again which fortunately is  sensitive to the Macrobid,   So I sent in another Rx for Macrobid  to continue 2 x /day for 2 more weeks                     ( total of 3 weeks bid),   then take 1 capsule at Bedtime for 2 more weeks beyond that   - you will finish up about Dec 1st ,   then come in between Dec 12 to Dec 30 to repeat U/A & C/S.

## 2021-08-25 ENCOUNTER — Other Ambulatory Visit: Payer: Self-pay | Admitting: Internal Medicine

## 2021-08-25 DIAGNOSIS — N390 Urinary tract infection, site not specified: Secondary | ICD-10-CM

## 2021-08-25 MED ORDER — NITROFURANTOIN MONOHYD MACRO 100 MG PO CAPS: ORAL_CAPSULE | ORAL | 0 refills | Status: DC

## 2021-08-26 DIAGNOSIS — H353122 Nonexudative age-related macular degeneration, left eye, intermediate dry stage: Secondary | ICD-10-CM | POA: Diagnosis not present

## 2021-08-26 DIAGNOSIS — H353211 Exudative age-related macular degeneration, right eye, with active choroidal neovascularization: Secondary | ICD-10-CM | POA: Diagnosis not present

## 2021-08-26 DIAGNOSIS — H43813 Vitreous degeneration, bilateral: Secondary | ICD-10-CM | POA: Diagnosis not present

## 2021-08-26 DIAGNOSIS — H43393 Other vitreous opacities, bilateral: Secondary | ICD-10-CM | POA: Diagnosis not present

## 2021-09-08 ENCOUNTER — Encounter: Payer: Self-pay | Admitting: Adult Health

## 2021-09-08 ENCOUNTER — Other Ambulatory Visit: Payer: Self-pay | Admitting: Adult Health

## 2021-09-08 DIAGNOSIS — M67432 Ganglion, left wrist: Secondary | ICD-10-CM | POA: Diagnosis not present

## 2021-09-08 MED ORDER — RIVAROXABAN 10 MG PO TABS: ORAL_TABLET | ORAL | 0 refills | Status: DC

## 2021-09-14 ENCOUNTER — Other Ambulatory Visit: Payer: Self-pay | Admitting: Orthopedic Surgery

## 2021-09-14 DIAGNOSIS — M67432 Ganglion, left wrist: Secondary | ICD-10-CM | POA: Diagnosis not present

## 2021-09-20 ENCOUNTER — Other Ambulatory Visit: Payer: Self-pay | Admitting: Internal Medicine

## 2021-09-20 MED ORDER — PSEUDOEPHEDRINE HCL ER 120 MG PO TB12: ORAL_TABLET | ORAL | 3 refills | Status: AC

## 2021-09-20 MED ORDER — AZITHROMYCIN 250 MG PO TABS: ORAL_TABLET | ORAL | 3 refills | Status: DC

## 2021-09-22 ENCOUNTER — Encounter (HOSPITAL_BASED_OUTPATIENT_CLINIC_OR_DEPARTMENT_OTHER): Payer: Self-pay | Admitting: Orthopedic Surgery

## 2021-09-22 ENCOUNTER — Other Ambulatory Visit: Payer: Self-pay

## 2021-09-22 NOTE — Progress Notes (Signed)
Xarelto instructions received. Instructed patient to hold Xarelto 72 hours prior to sx per order.

## 2021-09-22 NOTE — Progress Notes (Signed)
Spoke with Hassan Rowan, at Dr. Levell July office to notify that patient needs instructions from PCP regarding holding Xarelto. Hassan Rowan states she has them and will fax.

## 2021-09-24 ENCOUNTER — Encounter (HOSPITAL_BASED_OUTPATIENT_CLINIC_OR_DEPARTMENT_OTHER)
Admission: RE | Admit: 2021-09-24 | Discharge: 2021-09-24 | Disposition: A | Discharge status: Home / Self Care | Payer: Medicare HMO | Source: Ambulatory Visit | Attending: Orthopedic Surgery | Admitting: Orthopedic Surgery

## 2021-09-24 ENCOUNTER — Encounter: Payer: Self-pay | Admitting: Internal Medicine

## 2021-09-24 DIAGNOSIS — H353122 Nonexudative age-related macular degeneration, left eye, intermediate dry stage: Secondary | ICD-10-CM | POA: Diagnosis not present

## 2021-09-24 DIAGNOSIS — I10 Essential (primary) hypertension: Secondary | ICD-10-CM | POA: Diagnosis not present

## 2021-09-24 DIAGNOSIS — H353211 Exudative age-related macular degeneration, right eye, with active choroidal neovascularization: Secondary | ICD-10-CM | POA: Diagnosis not present

## 2021-09-24 DIAGNOSIS — H43393 Other vitreous opacities, bilateral: Secondary | ICD-10-CM | POA: Diagnosis not present

## 2021-09-24 DIAGNOSIS — Z01812 Encounter for preprocedural laboratory examination: Secondary | ICD-10-CM | POA: Diagnosis present

## 2021-09-24 DIAGNOSIS — H43813 Vitreous degeneration, bilateral: Secondary | ICD-10-CM | POA: Diagnosis not present

## 2021-09-24 LAB — BASIC METABOLIC PANEL
Anion gap: 9 (ref 5–15)
BUN: 13 mg/dL (ref 8–23)
CO2: 27 mmol/L (ref 22–32)
Calcium: 8.7 mg/dL — ABNORMAL LOW (ref 8.9–10.3)
Chloride: 99 mmol/L (ref 98–111)
Creatinine, Ser: 0.49 mg/dL (ref 0.44–1.00)
GFR, Estimated: >60 mL/min (ref >60)
Glucose, Bld: 100 mg/dL — ABNORMAL HIGH (ref 70–99)
Potassium: 4.2 mmol/L (ref 3.5–5.1)
Sodium: 135 mmol/L (ref 135–145)

## 2021-09-24 NOTE — Progress Notes (Signed)

## 2021-09-27 ENCOUNTER — Other Ambulatory Visit: Payer: Self-pay | Admitting: Internal Medicine

## 2021-09-27 DIAGNOSIS — N390 Urinary tract infection, site not specified: Secondary | ICD-10-CM

## 2021-09-28 ENCOUNTER — Other Ambulatory Visit: Payer: Medicare HMO

## 2021-09-28 ENCOUNTER — Other Ambulatory Visit: Payer: Self-pay

## 2021-09-28 DIAGNOSIS — N390 Urinary tract infection, site not specified: Secondary | ICD-10-CM

## 2021-09-29 ENCOUNTER — Ambulatory Visit (HOSPITAL_BASED_OUTPATIENT_CLINIC_OR_DEPARTMENT_OTHER): Payer: Medicare HMO | Admitting: Anesthesiology

## 2021-09-29 ENCOUNTER — Other Ambulatory Visit: Payer: Self-pay

## 2021-09-29 ENCOUNTER — Encounter (HOSPITAL_BASED_OUTPATIENT_CLINIC_OR_DEPARTMENT_OTHER): Payer: Self-pay | Admitting: Orthopedic Surgery

## 2021-09-29 ENCOUNTER — Ambulatory Visit (HOSPITAL_BASED_OUTPATIENT_CLINIC_OR_DEPARTMENT_OTHER)
Admission: RE | Admit: 2021-09-29 | Discharge: 2021-09-29 | Disposition: A | Discharge status: Home / Self Care | Payer: Medicare HMO | Source: Ambulatory Visit | Attending: Orthopedic Surgery | Admitting: Orthopedic Surgery

## 2021-09-29 ENCOUNTER — Encounter (HOSPITAL_BASED_OUTPATIENT_CLINIC_OR_DEPARTMENT_OTHER)
Admission: RE | Disposition: A | Discharge status: Home / Self Care | Payer: Self-pay | Source: Ambulatory Visit | Attending: Orthopedic Surgery

## 2021-09-29 DIAGNOSIS — E559 Vitamin D deficiency, unspecified: Secondary | ICD-10-CM | POA: Diagnosis not present

## 2021-09-29 DIAGNOSIS — Z7901 Long term (current) use of anticoagulants: Secondary | ICD-10-CM | POA: Insufficient documentation

## 2021-09-29 DIAGNOSIS — M67432 Ganglion, left wrist: Secondary | ICD-10-CM | POA: Diagnosis not present

## 2021-09-29 DIAGNOSIS — K219 Gastro-esophageal reflux disease without esophagitis: Secondary | ICD-10-CM | POA: Diagnosis not present

## 2021-09-29 DIAGNOSIS — Z8616 Personal history of COVID-19: Secondary | ICD-10-CM | POA: Insufficient documentation

## 2021-09-29 DIAGNOSIS — E785 Hyperlipidemia, unspecified: Secondary | ICD-10-CM | POA: Diagnosis not present

## 2021-09-29 DIAGNOSIS — Z86711 Personal history of pulmonary embolism: Secondary | ICD-10-CM | POA: Diagnosis not present

## 2021-09-29 DIAGNOSIS — D759 Disease of blood and blood-forming organs, unspecified: Secondary | ICD-10-CM | POA: Insufficient documentation

## 2021-09-29 DIAGNOSIS — I10 Essential (primary) hypertension: Secondary | ICD-10-CM | POA: Diagnosis not present

## 2021-09-29 HISTORY — DX: Essential (primary) hypertension: I10

## 2021-09-29 HISTORY — PX: GANGLION CYST EXCISION: SHX1691

## 2021-09-29 SURGERY — EXCISION, GANGLION CYST, WRIST
Anesthesia: Monitor Anesthesia Care | Site: Wrist | Laterality: Left

## 2021-09-29 MED ORDER — MIDAZOLAM HCL 2 MG/2ML IJ SOLN
INTRAMUSCULAR | Status: AC
  Filled 2021-09-29: qty 2

## 2021-09-29 MED ORDER — PROPOFOL 500 MG/50ML IV EMUL
INTRAVENOUS | Status: DC | PRN
  Administered 2021-09-29: 75 ug/kg/min via INTRAVENOUS

## 2021-09-29 MED ORDER — ONDANSETRON HCL 4 MG/2ML IJ SOLN
INTRAMUSCULAR | Status: DC | PRN
  Administered 2021-09-29: 4 mg via INTRAVENOUS

## 2021-09-29 MED ORDER — FENTANYL CITRATE (PF) 100 MCG/2ML IJ SOLN
INTRAMUSCULAR | Status: AC
  Filled 2021-09-29: qty 2

## 2021-09-29 MED ORDER — PROPOFOL 10 MG/ML IV BOLUS
INTRAVENOUS | Status: AC
  Filled 2021-09-29: qty 20

## 2021-09-29 MED ORDER — 0.9 % SODIUM CHLORIDE (POUR BTL) OPTIME
TOPICAL | Status: DC | PRN
  Administered 2021-09-29: 60 mL

## 2021-09-29 MED ORDER — LACTATED RINGERS IV SOLN: INTRAVENOUS | Status: DC

## 2021-09-29 MED ORDER — TRAMADOL HCL 50 MG PO TABS: 50.0000 mg | ORAL_TABLET | Freq: Four times a day (QID) | ORAL | 0 refills | Status: DC | PRN

## 2021-09-29 MED ORDER — FENTANYL CITRATE (PF) 100 MCG/2ML IJ SOLN
50.0000 ug | Freq: Once | INTRAMUSCULAR | Status: AC
  Administered 2021-09-29: 50 ug via INTRAVENOUS

## 2021-09-29 MED ORDER — ACETAMINOPHEN 500 MG PO TABS
ORAL_TABLET | ORAL | Status: AC
  Filled 2021-09-29: qty 2

## 2021-09-29 MED ORDER — FENTANYL CITRATE (PF) 100 MCG/2ML IJ SOLN: 25.0000 ug | INTRAMUSCULAR | Status: DC | PRN

## 2021-09-29 MED ORDER — PROPOFOL 10 MG/ML IV BOLUS
INTRAVENOUS | Status: DC | PRN
  Administered 2021-09-29: 10 mg via INTRAVENOUS
  Administered 2021-09-29: 20 mg via INTRAVENOUS

## 2021-09-29 MED ORDER — CLINDAMYCIN PHOSPHATE 900 MG/50ML IV SOLN
900.0000 mg | INTRAVENOUS | Status: AC
  Administered 2021-09-29: 900 mg via INTRAVENOUS

## 2021-09-29 MED ORDER — PROPOFOL 500 MG/50ML IV EMUL
INTRAVENOUS | Status: AC
  Filled 2021-09-29: qty 50

## 2021-09-29 MED ORDER — MIDAZOLAM HCL 2 MG/2ML IJ SOLN
1.0000 mg | Freq: Once | INTRAMUSCULAR | Status: AC
  Administered 2021-09-29: 1 mg via INTRAVENOUS

## 2021-09-29 MED ORDER — DEXAMETHASONE SODIUM PHOSPHATE 10 MG/ML IJ SOLN
INTRAMUSCULAR | Status: DC | PRN
  Administered 2021-09-29: 5 mg

## 2021-09-29 MED ORDER — ONDANSETRON HCL 4 MG/2ML IJ SOLN
INTRAMUSCULAR | Status: AC
  Filled 2021-09-29: qty 2

## 2021-09-29 MED ORDER — ACETAMINOPHEN 500 MG PO TABS
1000.0000 mg | ORAL_TABLET | Freq: Once | ORAL | Status: AC
  Administered 2021-09-29: 1000 mg via ORAL

## 2021-09-29 MED ORDER — CLINDAMYCIN PHOSPHATE 900 MG/50ML IV SOLN
INTRAVENOUS | Status: AC
  Filled 2021-09-29: qty 50

## 2021-09-29 MED ORDER — ROPIVACAINE HCL 5 MG/ML IJ SOLN
INTRAMUSCULAR | Status: DC | PRN
  Administered 2021-09-29: 25 mL via PERINEURAL

## 2021-09-29 SURGICAL SUPPLY — 37 items
APL PRP STRL LF DISP 70% ISPRP (MISCELLANEOUS) ×1
BLADE SURG 15 STRL LF DISP TIS (BLADE) ×1 IMPLANT
BLADE SURG 15 STRL SS (BLADE) ×2
BNDG CMPR 9X4 STRL LF SNTH (GAUZE/BANDAGES/DRESSINGS) ×1
BNDG COHESIVE 3X5 TAN ST LF (GAUZE/BANDAGES/DRESSINGS) ×2 IMPLANT
BNDG ESMARK 4X9 LF (GAUZE/BANDAGES/DRESSINGS) ×1 IMPLANT
BNDG GAUZE ELAST 4 BULKY (GAUZE/BANDAGES/DRESSINGS) ×2 IMPLANT
CHLORAPREP W/TINT 26 (MISCELLANEOUS) ×2 IMPLANT
CORD BIPOLAR FORCEPS 12FT (ELECTRODE) ×2 IMPLANT
COVER BACK TABLE 60X90IN (DRAPES) ×2 IMPLANT
COVER MAYO STAND STRL (DRAPES) ×2 IMPLANT
DRAPE EXTREMITY T 121X128X90 (DISPOSABLE) ×2 IMPLANT
DRAPE U-SHAPE 47X51 STRL (DRAPES) ×2 IMPLANT
GAUZE SPONGE 4X4 12PLY STRL (GAUZE/BANDAGES/DRESSINGS) ×2 IMPLANT
GAUZE XEROFORM 1X8 LF (GAUZE/BANDAGES/DRESSINGS) ×2 IMPLANT
GLOVE SURG POLYISO LF SZ6.5 (GLOVE) ×1 IMPLANT
GLOVE SURG POLYISO LF SZ7.5 (GLOVE) ×2 IMPLANT
GLOVE SURG POLYISO LF SZ8 (GLOVE) ×1 IMPLANT
GLOVE SURG UNDER POLY LF SZ6.5 (GLOVE) ×1 IMPLANT
GOWN STRL REUS W/ TWL LRG LVL3 (GOWN DISPOSABLE) ×1 IMPLANT
GOWN STRL REUS W/TWL LRG LVL3 (GOWN DISPOSABLE) ×4
GOWN STRL REUS W/TWL XL LVL3 (GOWN DISPOSABLE) ×2 IMPLANT
NS IRRIG 1000ML POUR BTL (IV SOLUTION) ×2 IMPLANT
PACK BASIN DAY SURGERY FS (CUSTOM PROCEDURE TRAY) ×2 IMPLANT
PAD CAST 3X4 CTTN HI CHSV (CAST SUPPLIES) ×1 IMPLANT
PADDING CAST ABS 4INX4YD NS (CAST SUPPLIES) ×1
PADDING CAST ABS COTTON 4X4 ST (CAST SUPPLIES) ×1 IMPLANT
PADDING CAST COTTON 3X4 STRL (CAST SUPPLIES) ×2
SLING ARM FOAM STRAP LRG (SOFTGOODS) ×1 IMPLANT
SPLINT PLASTER CAST XFAST 3X15 (CAST SUPPLIES) ×8 IMPLANT
SPLINT PLASTER XTRA FASTSET 3X (CAST SUPPLIES) ×8
STOCKINETTE 4X48 STRL (DRAPES) ×2 IMPLANT
SUT ETHILON 4 0 PS 2 18 (SUTURE) ×2 IMPLANT
SUT VIC AB 4-0 P2 18 (SUTURE) ×1 IMPLANT
SYR BULB EAR ULCER 3OZ GRN STR (SYRINGE) ×2 IMPLANT
TOWEL GREEN STERILE FF (TOWEL DISPOSABLE) ×4 IMPLANT
UNDERPAD 30X36 HEAVY ABSORB (UNDERPADS AND DIAPERS) ×2 IMPLANT

## 2021-09-29 NOTE — Anesthesia Postprocedure Evaluation (Signed)
Anesthesia Post Note  Patient: Vanessa French  Procedure(s) Performed: EXCISION VOLAR CYST LEFT WRIST RECURRENT (Left: Wrist)     Patient location during evaluation: PACU Anesthesia Type: Regional and MAC Level of consciousness: awake and alert Pain management: pain level controlled Vital Signs Assessment: post-procedure vital signs reviewed and stable Respiratory status: spontaneous breathing, nonlabored ventilation, respiratory function stable and patient connected to nasal cannula oxygen Cardiovascular status: stable and blood pressure returned to baseline Postop Assessment: no apparent nausea or vomiting Anesthetic complications: no   No notable events documented.  Last Vitals:  Vitals:   09/29/21 1300 09/29/21 1336  BP: 127/81 132/76  Pulse: 67 75  Resp: 13 16  Temp:  36.5 C  SpO2: 95%     Last Pain:  Vitals:   09/29/21 1324  TempSrc:   PainSc: 0-No pain                 Ahuva Poynor L Chaunta Bejarano

## 2021-09-29 NOTE — Brief Op Note (Signed)
09/29/2021  12:24 PM  PATIENT:  Vanessa French  70 y.o. female  PRE-OPERATIVE DIAGNOSIS:  GANGLION VOLAR LEFT WRIST REDO  POST-OPERATIVE DIAGNOSIS:  GANGLION VOLAR LEFT WRIST REDO  PROCEDURE:  Procedure(s) with comments: EXCISION VOLAR CYST LEFT WRIST RECURRENT (Left) - regional with MAC  SURGEON:  Surgeon(s) and Role:    * Daryll Brod, MD - Primary  PHYSICIAN ASSISTANT:   ASSISTANTS: R Dasnoit PA-C  ANESTHESIA:   regional and IV sedation  EBL: 2 mL  BLOOD ADMINISTERED:none  DRAINS: none   LOCAL MEDICATIONS USED:  NONE  SPECIMEN:  Excision  DISPOSITION OF SPECIMEN:  PATHOLOGY  COUNTS:  YES    DICTATION: .Dragon Dictation  PLAN OF CARE: Discharge to home after PACU  PATIENT DISPOSITION: PACU-hemodynamically stable

## 2021-09-29 NOTE — Op Note (Signed)
NAME: Vanessa French MEDICAL RECORD NO: [id number] DATE OF BIRTH: [birth year] FAC[facility] LOCATION: [facility] PHYSICIAN: Wynonia Sours, MD   OPERATIVE REPORT   DATE OF PROCEDURE: 09/29/21    PREOPERATIVE DIAGNOSIS: Recurrent ganglion cyst left volar wrist   POSTOPERATIVE DIAGNOSIS: Same   PROCEDURE: Excision volar wrist ganglion left wrist   SURGEON: Daryll Brod, M.D.   ASSISTANT: Leverne Humbles, [facility]   ANESTHESIA:  Regional with sedation   INTRAVENOUS FLUIDS:  Per anesthesia flow sheet.   ESTIMATED BLOOD LOSS:  Minimal.   COMPLICATIONS:  None.   SPECIMENS: Cyst   TOURNIQUET TIME:    Total Tourniquet Time Documented: Upper Arm (Left) - 22 minutes Total: Upper Arm (Left) - 22 minutes    DISPOSITION:  Stable to PACU.   INDICATIONS: Patient is a 70 year old female with a history of a cyst of the volar radial aspect of her wrist is removed on 1 occasion.  It is recurred.  She is desires having this removed.  Pre-.  Postoperative course been discussed along with risks and complications.  She is aware that there is no guarantee to the surgery the possibility of infection recurrence injury to arteries nerves tendons incomplete relief symptoms dystrophy.  Preoperative area the patient seen extremity marked by both patient and surgeon antibiotic given.  An axillary block was carried out under the direction anesthesia department.  OPERATIVE COURSE: Patient is brought the operating room placed in the supine position with left arm free.  Prep was done with ChloraPrep.  A 3-minute dry time was allowed and a timeout taken confirm patient procedure.  The limb was exsanguinated with an Esmarch bandage turn placed high in the arm was inflated to 250 mmHg.  A longitudinal incision was made incorporating the old scar directly over the mass carried down through subcutaneous tissue.  The mass was immediately encountered.  Bleeders were electrocauterized with bipolar.   Neurovascular structures were identified protected and the dissection was carried down to the entrance of the joint.  The entire cyst was excised and sent to pathology.  The wound was irrigated with saline.  The capsule was then repaired with a 4-0 Vicryl suture.  The subcutaneous tissue was closed interrupted 4-0 Vicryl and skin with interrupted 4-0 nylon sutures.  A compressive dressing and volar splint was applied.  Deflation of the tourniquet all fingers immediately pink.  She was taken to the recovery room for observation in satisfactory condition.  She will be discharged home to return to the hand center [facility] in 1 week on tramadol for discomfort.   Daryll Brod, MD Electronically signed, 09/29/21

## 2021-09-29 NOTE — Anesthesia Procedure Notes (Signed)
Anesthesia Regional Block: Supraclavicular block   Pre-Anesthetic Checklist: , timeout performed,  Correct Patient, Correct Site, Correct Laterality,  Correct Procedure, Correct Position, site marked,  Risks and benefits discussed,  Surgical consent,  Pre-op evaluation,  At surgeon's request and post-op pain management  Laterality: Left  Prep: Maximum Sterile Barrier Precautions used, chloraprep       Needles:  Injection technique: Single-shot  Needle Type: Echogenic Stimulator Needle     Needle Length: 5cm  Needle Gauge: 22     Additional Needles:   Procedures:,,,, ultrasound used (permanent image in chart),,    Narrative:  Start time: 09/29/2021 11:10 AM End time: 09/29/2021 11:17 AM Injection made incrementally with aspirations every 5 mL.  Performed by: Personally  Anesthesiologist: Freddrick March, MD  Additional Notes: Monitors applied. No increased pain on injection. No increased resistance to injection. Injection made in 5cc increments. Good needle visualization. Patient tolerated procedure well.

## 2021-09-29 NOTE — H&P (Signed)
Vanessa French is an 70 y.o. female.   Chief Complaint: Volar mass left wrist VZD:[id number]  is a 70 year old hand dominant female referred by Dr. Janit Pagan for consultation regarding a mass on the volar radial aspect of the left wrist. This been present for a period of time he attempted to excise this in September 2022 and it has returned she has a prior history of a de Quervain's tendinitis treated by Dr. Lorin Mercy in 2010. He states however that it did get infected. He is on Xarelto for a small pulmonary embolus following COVID. It does not cause her pain unless she hits the area. She has no history of diabetes thyroid problems or gout. She does have a history of arthritis. Family history is positive for diabetes. She was sent for ultrasound in that this have been attempted to be resected in September. She has had the ultrasound done read out by Dr. Nelia Shi revealing a 1 x 2 x 1 0.1 x 6 cm anechoic mass.  Past Medical History:  Diagnosis Date   Acute pulmonary embolism [facility]) Post Covid - July 2022 05/14/2021   Closed fracture of right proximal humerus 04/07/2012   Diverticulosis of colon 02/14/2020   Frequent UTI    GERD (gastroesophageal reflux disease)    History of pulmonary embolus (PE) 11/03/2012   Hypertension    Psoriasis    Thoracic compression fracture [facility])     Past Surgical History:  Procedure Laterality Date   APPENDECTOMY  1959   BLADDER SUSPENSION  11/1998   BREAST CYST ASPIRATION Right 2011   CATARACT EXTRACTION, BILATERAL Bilateral 2016   Dr. Vanessa Ogden REPAIR  11/10/2018   Dr. Alona Bene, [place]  11/10/2018   Dr. Alona Bene   EXTENSOR TENDON OF FOREARM / WRIST REPAIR Left 2010   LEFT ARM   ORIF HUMERUS FRACTURE  04/07/2012   Procedure: OPEN REDUCTION INTERNAL FIXATION (ORIF) PROXIMAL HUMERUS FRACTURE;  Surgeon: Marybelle Killings, MD;  Location: [place];  Service: Orthopedics;  Laterality: Right;  Open Reduction Internal Fixation Right Proxmial Humerus    STERIOD INJECTION  04/07/2012   Procedure: STEROID INJECTION;  Surgeon: Marybelle Killings, MD;  Location: [place];  Service: Orthopedics;  Laterality: Right;   Right 1st Dorsal Compartment Injection   TONSILLECTOMY  1962   URETER REVISION Left 11/3003   COMPLIATION OF BLADDER SLING   vaginal hysterectomy with unilateral salpingooopherectomy  2000    Family History  Problem Relation Age of Onset   Heart disease Mother    Colon cancer Father 13   Atrial fibrillation Sister    Heart disease Maternal Grandfather    Heart failure Maternal Grandfather 34   Kidney Stones Son    Colon cancer Maternal Uncle    Social History:  reports that she has never smoked. She has never used smokeless tobacco. She reports that she does not drink alcohol and does not use drugs.  Allergies:  Allergies  Allergen Reactions   Penicillins Rash   Latex Rash    No medications prior to admission.    Results for orders placed or performed in visit on 09/28/21 (from the past 48 hour(s))  Urinalysis, Routine w reflex microscopic     Status: Abnormal   Collection Time: 09/28/21 11:13 AM  Result Value Ref Range   Color, Urine YELLOW YELLOW   APPearance CLOUDY (A) CLEAR   Specific Gravity, Urine 1.013 1.001 - 1.035   pH 6.5 5.0 - 8.0  Glucose, UA NEGATIVE NEGATIVE   Bilirubin Urine NEGATIVE NEGATIVE   Ketones, ur NEGATIVE NEGATIVE   Hgb urine dipstick 1+ (A) NEGATIVE   Protein, ur NEGATIVE NEGATIVE   Nitrite NEGATIVE NEGATIVE   Leukocytes,Ua 3+ (A) NEGATIVE   WBC, UA > OR = 60 (A) 0 - 5 /HPF   RBC / HPF NONE SEEN 0 - 2 /HPF   Squamous Epithelial / LPF NONE SEEN < OR = 5 /HPF   Bacteria, UA NONE SEEN NONE SEEN /HPF   Hyaline Cast NONE SEEN NONE SEEN /LPF  MICROSCOPIC MESSAGE     Status: None   Collection Time: 09/28/21 11:13 AM  Result Value Ref Range   Note      Comment: This urine was analyzed for the presence of WBC,  RBC, bacteria, casts, and other formed elements.  Only those elements seen were  reported. . .     No results found.   Pertinent items are noted in HPI.  Height 5\' 3"  (1.6 m), weight 73.5 kg.  General appearance: alert, cooperative, and appears stated age Head: Normocephalic, without obvious abnormality Neck: no JVD Resp: clear to auscultation bilaterally Cardio: regular rate and rhythm, S1, S2 normal, no murmur, click, rub or gallop GI: soft, non-tender; bowel sounds normal; no masses,  no organomegaly Extremities:  Recurrent cyst left wrist Pulses: 2+ and symmetric Skin: Skin color, texture, turgor normal. No rashes or lesions Neurologic: Grossly normal Incision/Wound: na  Assessment/Plan Diagnosis volar radial wrist ganglion left Plan: She would like to have this removed. Pre-. Postoperative course are discussed along with risk and complications. She is aware that there is no guarantee to the surgery possibility of infection recurrence injury to arteries nerves tendons incomplete relief symptoms dystrophy possibility of repair of the artery may be necessary this is scheduled as an outpatient under regional anesthesia for excision recurrent ganglion cyst left wrist volar aspect this to be done under regional anesthesia  Daryll Brod 09/29/2021, 8:36 AM

## 2021-09-29 NOTE — Anesthesia Preprocedure Evaluation (Addendum)
Anesthesia Evaluation  Patient identified by MRN, date of birth, ID band Patient awake    Reviewed: Allergy & Precautions, NPO status , Patient's Chart, lab work & pertinent test results  Airway Mallampati: III  TM Distance: >3 FB Neck ROM: Full    Dental no notable dental hx. (+) Teeth Intact, Dental Advisory Given   Pulmonary PE   Pulmonary exam normal breath sounds clear to auscultation       Cardiovascular hypertension, Pt. on medications Normal cardiovascular exam Rhythm:Regular Rate:Normal     Neuro/Psych negative neurological ROS  negative psych ROS   GI/Hepatic Neg liver ROS, GERD  Medicated and Controlled,  Endo/Other  negative endocrine ROS  Renal/GU negative Renal ROS  negative genitourinary   Musculoskeletal negative musculoskeletal ROS (+)   Abdominal   Peds  Hematology  (+) Blood dyscrasia (on xarelto ), ,   Anesthesia Other Findings   Reproductive/Obstetrics                            Anesthesia Physical Anesthesia Plan  ASA: 3  Anesthesia Plan: MAC and Regional   Post-op Pain Management: Regional block and Tylenol PO (pre-op)   Induction: Intravenous  PONV Risk Score and Plan: 2 and Propofol infusion, Treatment may vary due to age or medical condition, Midazolam and Ondansetron  Airway Management Planned: Natural Airway  Additional Equipment:   Intra-op Plan:   Post-operative Plan:   Informed Consent: I have reviewed the patients History and Physical, chart, labs and discussed the procedure including the risks, benefits and alternatives for the proposed anesthesia with the patient or authorized representative who has indicated his/her understanding and acceptance.     Dental advisory given  Plan Discussed with: CRNA  Anesthesia Plan Comments:         Anesthesia Quick Evaluation

## 2021-09-29 NOTE — Discharge Instructions (Addendum)
Hand Center Instructions Hand Surgery  Wound Care: Keep your hand elevated above the level of your heart.  Do not allow it to dangle by your side.  Keep the dressing dry and do not remove it unless your doctor advises you to do so.  He will usually change it at the time of your post-op visit.  Moving your fingers is advised to stimulate circulation but will depend on the site of your surgery.  If you have a splint applied, your doctor will advise you regarding movement.  Activity: Do not drive or operate machinery today.  Rest today and then you may return to your normal activity and work as indicated by your physician.  Diet:  Drink liquids today or eat a light diet.  You may resume a regular diet tomorrow.    General expectations: Pain for two to three days. Fingers may become slightly swollen.  Call your doctor if any of the following occur: Severe pain not relieved by pain medication. Elevated temperature. Dressing soaked with blood. Inability to move fingers. White or bluish color to fingers.   No Tylenol until 4:02 pm   Post Anesthesia Home Care Instructions  Activity: Get plenty of rest for the remainder of the day. A responsible individual must stay with you for 24 hours following the procedure.  For the next 24 hours, DO NOT: -Drive a car -Paediatric nurse -Drink alcoholic beverages -Take any medication unless instructed by your physician -Make any legal decisions or sign important papers.  Meals: Start with liquid foods such as gelatin or soup. Progress to regular foods as tolerated. Avoid greasy, spicy, heavy foods. If nausea and/or vomiting occur, drink only clear liquids until the nausea and/or vomiting subsides. Call your physician if vomiting continues.  Special Instructions/Symptoms: Your throat may feel dry or sore from the anesthesia or the breathing tube placed in your throat during surgery. If this causes discomfort, gargle with warm salt water. The  discomfort should disappear within 24 hours.  If you had a scopolamine patch placed behind your ear for the management of post- operative nausea and/or vomiting:  1. The medication in the patch is effective for 72 hours, after which it should be removed.  Wrap patch in a tissue and discard in the trash. Wash hands thoroughly with soap and water. 2. You may remove the patch earlier than 72 hours if you experience unpleasant side effects which may include dry mouth, dizziness or visual disturbances. 3. Avoid touching the patch. Wash your hands with soap and water after contact with the patch.    Regional Anesthesia Blocks  1. Numbness or the inability to move the "blocked" extremity may last from 3-48 hours after placement. The length of time depends on the medication injected and your individual response to the medication. If the numbness is not going away after 48 hours, call your surgeon.  2. The extremity that is blocked will need to be protected until the numbness is gone and the  Strength has returned. Because you cannot feel it, you will need to take extra care to avoid injury. Because it may be weak, you may have difficulty moving it or using it. You may not know what position it is in without looking at it while the block is in effect.  3. For blocks in the legs and feet, returning to weight bearing and walking needs to be done carefully. You will need to wait until the numbness is entirely gone and the strength has returned. You  should be able to move your leg and foot normally before you try and bear weight or walk. You will need someone to be with you when you first try to ensure you do not fall and possibly risk injury.  4. Bruising and tenderness at the needle site are common side effects and will resolve in a few days.  5. Persistent numbness or new problems with movement should be communicated to the surgeon or the [facility] [phone] [place]  [phone]).

## 2021-09-29 NOTE — Transfer of Care (Signed)
Immediate Anesthesia Transfer of Care Note  Patient: Vanessa French  Procedure(s) Performed: EXCISION VOLAR CYST LEFT WRIST RECURRENT (Left: Wrist)  Patient Location: PACU  Anesthesia Type:MAC combined with regional for post-op pain  Level of Consciousness: awake, alert  and oriented  Airway & Oxygen Therapy: Patient Spontanous Breathing and Patient connected to face mask oxygen  Post-op Assessment: Report given to RN and Post -op Vital signs reviewed and stable  Post vital signs: Reviewed and stable  Last Vitals:  Vitals Value Taken Time  BP    Temp 36.5 C 09/29/21 1240  Pulse 68 09/29/21 1241  Resp 11 09/29/21 1241  SpO2 97 % 09/29/21 1241  Vitals shown include unvalidated device data.  Last Pain:  Vitals:   09/29/21 0958  TempSrc: Oral  PainSc: 0-No pain         Complications: No notable events documented.

## 2021-09-29 NOTE — Progress Notes (Signed)
Assisted Dr. Lanetta Inch with left, ultrasound guided, supraclavicular block. Side rails up, monitors on throughout procedure. See vital signs in flow sheet. Tolerated Procedure well.

## 2021-09-30 ENCOUNTER — Other Ambulatory Visit: Payer: Self-pay | Admitting: Internal Medicine

## 2021-09-30 ENCOUNTER — Encounter: Payer: Self-pay | Admitting: Internal Medicine

## 2021-09-30 DIAGNOSIS — N390 Urinary tract infection, site not specified: Secondary | ICD-10-CM

## 2021-09-30 LAB — URINALYSIS, ROUTINE W REFLEX MICROSCOPIC
Bacteria, UA: NONE SEEN /HPF
Bilirubin Urine: NEGATIVE
Glucose, UA: NEGATIVE
Hyaline Cast: NONE SEEN /LPF
Ketones, ur: NEGATIVE
Nitrite: NEGATIVE
Protein, ur: NEGATIVE
RBC / HPF: NONE SEEN /HPF (ref 0–2)
Specific Gravity, Urine: 1.013 (ref 1.001–1.035)
Squamous Epithelial / HPF: NONE SEEN /HPF (ref <=5)
WBC, UA: >=60 /HPF — AB (ref 0–5)
pH: 6.5 (ref 5.0–8.0)

## 2021-09-30 LAB — MICROSCOPIC MESSAGE

## 2021-09-30 LAB — URINE CULTURE
MICRO NUMBER:: 12715087
SPECIMEN QUALITY:: ADEQUATE

## 2021-09-30 LAB — SURGICAL PATHOLOGY

## 2021-09-30 MED ORDER — NITROFURANTOIN MONOHYD MACRO 100 MG PO CAPS: ORAL_CAPSULE | ORAL | 1 refills | Status: DC

## 2021-09-30 NOTE — Progress Notes (Signed)
============================================================ ============================================================  -    U/C unfortunately (+) for Enterococcus bacteria,                                                                         So sent in for Macrobid x 1 month  - Also recommend take Vitamin C 1,000 mg  2 x /day                                                                          to acidify the urine & kill bacteria  - Recommend repeat U/A & CS about 2 weeks after finish antibiotics  ============================================================ ============================================================

## 2021-10-01 ENCOUNTER — Encounter (HOSPITAL_BASED_OUTPATIENT_CLINIC_OR_DEPARTMENT_OTHER): Payer: Self-pay | Admitting: Orthopedic Surgery

## 2021-10-05 ENCOUNTER — Other Ambulatory Visit: Payer: Self-pay | Admitting: Adult Health

## 2021-10-05 DIAGNOSIS — Z1231 Encounter for screening mammogram for malignant neoplasm of breast: Secondary | ICD-10-CM

## 2021-10-06 ENCOUNTER — Encounter: Payer: Self-pay | Admitting: Adult Health

## 2021-10-07 DIAGNOSIS — M67432 Ganglion, left wrist: Secondary | ICD-10-CM | POA: Diagnosis not present

## 2021-10-29 DIAGNOSIS — H353211 Exudative age-related macular degeneration, right eye, with active choroidal neovascularization: Secondary | ICD-10-CM | POA: Diagnosis not present

## 2021-10-29 DIAGNOSIS — H43813 Vitreous degeneration, bilateral: Secondary | ICD-10-CM | POA: Diagnosis not present

## 2021-10-29 DIAGNOSIS — H353122 Nonexudative age-related macular degeneration, left eye, intermediate dry stage: Secondary | ICD-10-CM | POA: Diagnosis not present

## 2021-11-03 ENCOUNTER — Other Ambulatory Visit: Payer: Self-pay

## 2021-11-03 MED ORDER — RIVAROXABAN 10 MG PO TABS: ORAL_TABLET | ORAL | 0 refills | Status: DC

## 2021-11-05 ENCOUNTER — Ambulatory Visit
Admission: RE | Admit: 2021-11-05 | Discharge: 2021-11-05 | Disposition: A | Discharge status: Home / Self Care | Payer: Medicare HMO | Source: Ambulatory Visit | Attending: Adult Health | Admitting: Adult Health

## 2021-11-05 DIAGNOSIS — Z1231 Encounter for screening mammogram for malignant neoplasm of breast: Secondary | ICD-10-CM | POA: Diagnosis not present

## 2021-11-16 NOTE — Progress Notes (Signed)
\   Date Time Provider Department  11/17/2021 10:30 AM Unk Pinto, MD [facility]  02/16/2022                 Wellness 11:00 AM [name], NP [facility]  08/11/2022                CPE  9:00 AM [name], NP [facility]    History of Present Illness:       This very nice 71 y.o. MWF  presents for 6 month follow up with HTN, HLD, Pre-Diabetes and Vitamin D Deficiency. Last July 2022, patient did have a Covid -19 infection and post infection did have episodic tachycardia & D-dimer tested (+) with CT angio finding a tiny LUL PE and patient was treated with Xarelto for 6 months. Til tapering off about 2 weeks ago & restarting LD bASA 81 mg.  Recently patient has experienced some episodes of mild tachycardia.        Patient has hx/o recurrent UTI & in Dec was treated for an Enterococcus UTI with Macrobid for 1 month and U/C is repeated today.        Patient is treated for HTN & BP has been controlled at home. Todays BP is at goal - 116/80. Patient has had no complaints of any cardiac type chest pain, palpitations, dyspnea / orthopnea / PND, dizziness, claudication, or dependent edema.       Hyperlipidemia is controlled with diet & meds. Patient denies myalgias or other med SEs. Last Lipids were slightly elevated LDL 112 :   Lab Results  Component Value Date   CHOL 189 08/11/2021   HDL 60 08/11/2021   LDLCALC 112 (H) 08/11/2021   TRIG 78 08/11/2021   CHOLHDL 3.2 08/11/2021     Also, the patient has history of PreDiabetes (A1c 5.&% in 2017 & 2019) and has had no symptoms of reactive hypoglycemia, diabetic polys, paresthesias or visual blurring.  Last A1c was normal & at goal :  Lab Results  Component Value Date   HGBA1C 5.3 08/11/2021                                                        Further, the patient also has history of Vitamin D Deficiency and supplements vitamin D without any suspected side-effects. Last vitamin D was borderline high normal :   Lab  Results  Component Value Date   VD25OH 107 (H) 08/11/2021     Current Outpatient Medications on File Prior to Visit  Medication Sig   alendronate (FOSAMAX) 70 MG tablet TAKE 1 TABLET WEEKLY    Ascorbic Acid (VITAMIN C/ROSE HIPS PO) Take daily.   Bevacizumab (AVASTIN) 100 MG/4ML SOLN Inject into the vein. One injection once a month   calcipotriene (DOVONOX) 0.005 % cream Apply 1 application topically at bedtime.   calcium carbonate (TUMS EX) 750 MG chewable tablet Chew 1 tablet by mouth daily.   cetirizine (ZYRTEC) 10 MG tablet Take 10 mg by mouth as needed for allergies.   Cholecalciferol (VITAMIN D-3) 125 MCG (5000 UT) TABS Alternate taking 1 and 2 caps every other day.   CINNAMON PO Take 1,000 mg by mouth daily.   CRANBERRY PO Take by mouth. Take 2 caplets BID   DULoxetine (CYMBALTA) 60 MG capsule Take  1 capsule  Daily  for Mood & Chronic Back Pain   famotidine (PEPCID) 20 MG tablet TAKE ONE TABLET BY MOUTH TWICE A DAY FOR INDIGESTION AND HEARTBURN   hydrochlorothiazide (HYDRODIURIL) 25 MG tablet TAKE 1/2 - 1 TABLET BY MOUTH AS NEEDED ONLY FOR BLOOD PRESSURE AND FLUID RETENTION/ ANKLE SWELLING   Methylcobalamin (B12 SL) Place 1 tablet under the tongue See admin instructions. 3-4 days/week   Multiple Vitamin (MULTI-VITAMIN DAILY PO) Take 1 tablet by mouth daily.   nitrofurantoin, macrocrystal-monohydrate, (MACROBID) 100 MG capsule Take  1 capsule  2 x / day with meals  for 1 month   omeprazole (PRILOSEC) 20 MG capsule Take 20 mg by mouth at bedtime.   pseudoephedrine (SUDAFED) 120 MG 12 hr tablet Take  1 tablet  2 x /day (every 12 hours)  for Sinus & Chest Congestion   rivaroxaban (XARELTO) 10 MG TABS tablet TAKE ONE TABLET BY MOUTH DAILY TO PREVENT BLOOD CLOTS   traMADol (ULTRAM) 50 MG tablet Take 1 tablet (50 mg total) by mouth every 6 (six) hours as needed.   triamcinolone cream (KENALOG) 0.1 % Apply 1 application topically as needed.)      Allergies  Allergen Reactions    Penicillins Rash   Latex Rash     PMHx:   Past Medical History:  Diagnosis Date   Acute pulmonary embolism [facility]) Post Covid - July 2022 05/14/2021   Closed fracture of right proximal humerus 04/07/2012   Diverticulosis of colon 02/14/2020   Frequent UTI    GERD (gastroesophageal reflux disease)    History of pulmonary embolus (PE) 11/03/2012   Hypertension    Psoriasis    Thoracic compression fracture (HCC)      Immunization History  Administered Date(s) Administered   Hepatitis B 01/11/2013   Influenza Split 07/25/2012, 08/12/2014, 08/21/2015   Influenza, High Dose 08/02/2017, 07/13/2018, 08/03/2019, 08/06/2020   Influenza,inj,quad 08/13/2016   PFIZER SARS-COV-2 Vacc 12/23/2019, 01/16/2020, 09/20/2020   PPD Test 07/28/2015, 08/13/2016, 07/13/2018   Pneumococcal -13 12/15/2017   Pneumococcal -23 04/16/2002   Tdap 12/21/2011   Zoster, Live 10/26/2003     Past Surgical History:  Procedure Laterality Date   [place]  11/1998   BREAST CYST ASPIRATION Right 2011   CATARACT EXTRACTION, BILATERAL Bilateral 2016   Dr. Vanessa [place] REPAIR  11/10/2018   Dr. Alona Bene, [place]  11/10/2018   Dr. Alona Bene   EXTENSOR TENDON OF FOREARM / WRIST REPAIR Left 2010   LEFT ARM   GANGLION CYST EXCISION Left 09/29/2021   Procedure: EXCISION VOLAR CYST LEFT WRIST RECURRENT;  Surgeon: Daryll Brod, MD;  Location: [place];  Service: Orthopedics;  Laterality: Left;  regional with MAC   ORIF HUMERUS FRACTURE  04/07/2012   Procedure: OPEN REDUCTION INTERNAL FIXATION (ORIF) PROXIMAL HUMERUS FRACTURE;  Surgeon: Marybelle Killings, MD;  Location: [place];  Service: Orthopedics;  Laterality: Right;  Open Reduction Internal Fixation Right Proxmial Humerus   STERIOD INJECTION  04/07/2012   Procedure: STEROID INJECTION;  Surgeon: Marybelle Killings, MD;  Location: [place];  Service: Orthopedics;  Laterality: Right;   Right 1st Dorsal  Compartment Injection   TONSILLECTOMY  1962   URETER REVISION Left 11/3003   COMPLIATION OF BLADDER SLING   vaginal hysterectomy with unilateral salpingooopherectomy  2000    FHx:    Reviewed / unchanged  SHx:    Reviewed / unchanged   Systems Review:  Constitutional: Denies fever,  chills, wt changes, headaches, insomnia, fatigue, night sweats, change in appetite. Eyes: Denies redness, blurred vision, diplopia, discharge, itchy, watery eyes.  ENT: Denies discharge, congestion, post nasal drip, epistaxis, sore throat, earache, hearing loss, dental pain, tinnitus, vertigo, sinus pain, snoring.  CV: Denies chest pain, palpitations, irregular heartbeat, syncope, dyspnea, diaphoresis, orthopnea, PND, claudication or edema. Respiratory: denies cough, dyspnea, DOE, pleurisy, hoarseness, laryngitis, wheezing.  Gastrointestinal: Denies dysphagia, odynophagia, heartburn, reflux, water brash, abdominal pain or cramps, nausea, vomiting, bloating, diarrhea, constipation, hematemesis, melena, hematochezia  or hemorrhoids. Genitourinary: Denies dysuria, frequency, urgency, nocturia, hesitancy, discharge, hematuria or flank pain. Musculoskeletal: Denies arthralgias, myalgias, stiffness, jt. swelling, pain, limping or strain/sprain.  Skin: Denies pruritus, rash, hives, warts, acne, eczema or change in skin lesion(s). Neuro: No weakness, tremor, incoordination, spasms, paresthesia or pain. Psychiatric: Denies confusion, memory loss or sensory loss. Endo: Denies change in weight, skin or hair change.  Heme/Lymph: No excessive bleeding, bruising or enlarged lymph nodes.  Physical Exam  BP 116/80    Pulse (!) 108    Temp 97.9 F (36.6 C)    Resp 16    Ht _0  (1.6 m)    Wt 162 lb 8 oz (73.7 kg)    SpO2 95%    BMI 28.79 kg/m   Appears  well nourished, well groomed  and in no distress.  Eyes: PERRLA, EOMs, conjunctiva no swelling or erythema. Sinuses: No frontal/maxillary tenderness ENT/Mouth: EAC's  clear, TM's nl w/o erythema, bulging. Nares clear w/o erythema, swelling, exudates. Oropharynx clear without erythema or exudates. Oral hygiene is good. Tongue normal, non obstructing. Hearing intact.  Neck: Supple. Thyroid not palpable. Car 2+/2+ without bruits, nodes or JVD. Chest: Respirations nl with BS clear & equal w/o rales, rhonchi, wheezing or stridor.  Cor: Heart sounds normal w/ regular rate and rhythm without sig. murmurs, gallops, clicks or rubs. Peripheral pulses normal and equal  without edema.  Abdomen: Soft & bowel sounds normal. Non-tender w/o guarding, rebound, hernias, masses or organomegaly.  Lymphatics: Unremarkable.  Musculoskeletal: Full ROM all peripheral extremities, joint stability, 5/5 strength and normal gait.  Skin: Warm, dry without exposed rashes, lesions or ecchymosis apparent.  Neuro: Cranial nerves intact, reflexes equal bilaterally. Sensory-motor testing grossly intact. Tendon reflexes grossly intact.  Pysch: Alert & oriented x 3.  Insight and judgement nl & appropriate. No ideations.  Assessment and Plan:   1. Essential hypertension  - Continue medication, monitor blood pressure at home.  - Continue DASH diet.  Reminder to go to the ER if any CP,  SOB, nausea, dizziness, severe HA, changes vision/speech.   - CBC with Differential/Platelet - COMPLETE METABOLIC PANEL WITH GFR - Magnesium - TSH  2. Hyperlipidemia, mixed  - Continue diet/meds, exercise,& lifestyle modifications.  - Continue monitor periodic cholesterol/liver & renal functions    - Lipid panel - TSH  3. Abnormal glucose  - Hemoglobin A1c - Insulin, random  4. Vitamin D deficiency  - Continue diet, exercise  - Lifestyle modifications.  - Monitor appropriate labs. - Continue supplementation.    5. Gastroesophageal reflux disease without esophagitis   6. Recurrent UTI  - Urinalysis, Routine w reflex microscopic - Urine Culture  7. Medication management  - CBC with  Differential/Platelet - COMPLETE METABOLIC PANEL WITH GFR - Magnesium - Lipid panel - TSH - Hemoglobin A1c - Insulin, random - Urinalysis, Routine w reflex microscopic - Urine Culture         Discussed  regular exercise, BP monitoring, weight control to achieve/maintain  BMI less than 25 and discussed med and SE's. Recommended labs to assess and monitor clinical status with further disposition pending results of labs.  I discussed the assessment and treatment plan with the patient. The patient was provided an opportunity to ask questions and all were answered. The patient agreed with the plan and demonstrated an understanding of the instructions.  I provided over 30 minutes of exam, counseling, chart review and  complex critical decision making.        The patient was advised to call back or seek an in-person evaluation if the symptoms worsen or if the condition fails to improve as anticipated.   Vanessa Bouchard, MD

## 2021-11-16 NOTE — Patient Instructions (Signed)

## 2021-11-17 ENCOUNTER — Other Ambulatory Visit: Payer: Self-pay

## 2021-11-17 ENCOUNTER — Ambulatory Visit (INDEPENDENT_AMBULATORY_CARE_PROVIDER_SITE_OTHER): Payer: Medicare HMO | Admitting: Internal Medicine

## 2021-11-17 ENCOUNTER — Encounter: Payer: Self-pay | Admitting: Internal Medicine

## 2021-11-17 VITALS — BP 116/80 | HR 108 | Temp 97.9°F | Resp 16 | Ht 63.0 in | Wt 162.5 lb

## 2021-11-17 DIAGNOSIS — N390 Urinary tract infection, site not specified: Secondary | ICD-10-CM

## 2021-11-17 DIAGNOSIS — E559 Vitamin D deficiency, unspecified: Secondary | ICD-10-CM

## 2021-11-17 DIAGNOSIS — E782 Mixed hyperlipidemia: Secondary | ICD-10-CM

## 2021-11-17 DIAGNOSIS — K219 Gastro-esophageal reflux disease without esophagitis: Secondary | ICD-10-CM | POA: Diagnosis not present

## 2021-11-17 DIAGNOSIS — R7309 Other abnormal glucose: Secondary | ICD-10-CM | POA: Diagnosis not present

## 2021-11-17 DIAGNOSIS — I10 Essential (primary) hypertension: Secondary | ICD-10-CM | POA: Diagnosis not present

## 2021-11-17 DIAGNOSIS — N3281 Overactive bladder: Secondary | ICD-10-CM | POA: Diagnosis not present

## 2021-11-17 DIAGNOSIS — Z79899 Other long term (current) drug therapy: Secondary | ICD-10-CM

## 2021-11-17 DIAGNOSIS — Z23 Encounter for immunization: Secondary | ICD-10-CM | POA: Diagnosis not present

## 2021-11-18 ENCOUNTER — Encounter: Payer: Self-pay | Admitting: Internal Medicine

## 2021-11-18 MED ORDER — OXYBUTYNIN CHLORIDE ER 10 MG PO TB24: ORAL_TABLET | ORAL | 3 refills | Status: DC

## 2021-11-18 NOTE — Addendum Note (Signed)
Addended by: Unk Pinto on: 11/18/2021 01:34 PM   Modules accepted: Orders

## 2021-11-18 NOTE — Progress Notes (Signed)
============================================================ °-   Test results slightly outside the reference range are not unusual. If there is anything important, I will review this with you,  otherwise it is considered normal test values.  If you have further questions,  please do not hesitate to contact me at the office or via My Chart.  ============================================================ ============================================================  -  Total  Chol =     208    - Elevated             (  Ideal  or  Goal is less than 180  !  )   - and   -  Bad / Dangerous LDL  Chol =   118     - also Elevated              (  Ideal  or  Goal is less than 70  !  )   - Recommend low cholesterol diet   - Cholesterol only comes from animal sources  - ie. meat, dairy, egg yolks  - Eat all the vegetables you want.  - Avoid meat, especially red meat - Beef AND Pork .  - Avoid cheese & dairy - milk & ice cream.     - Cheese is the most concentrated form of trans-fats which  is the worst thing to clog up our arteries.   - Veggie cheese is OK which can be found in the fresh  produce section at [employer] or [employer] or [employer] ============================================================ ============================================================  -  Also recommend eat  # 4  [place] nuts each week   (( One study showed eating just #4 [place] nuts once month caused a 32% drop in the bad LDL Cholesterol for 1 month - which is better results than get with taking a cholesterol medicine every day !                       So, I suggest eating the # 4 [place] nuts once / week & recheck Chol at 3 month OV ( on 02/16/2022 w/Ashley )   ============================================================ ============================================================  -  U/A unfortunately suspicious for UTI . . . . . So waiting for Culture results   ============================================================ ============================================================  -  All else OK ============================================================ ============================================================-    - How are your heart rates running ?   Caryl Pina reminded me that  a fast heart rate was the symptom that prompted her to order the D-dimer before that lead to the CT Angiogram that diagnosed your Pulmonary embolus (lung clot) - - - - - - - -   - So if you're running fast heart rates we should recheck a D-dimer.  - bill mck  ============================================================ ============================================================  -

## 2021-11-20 ENCOUNTER — Other Ambulatory Visit: Payer: Self-pay | Admitting: Internal Medicine

## 2021-11-20 LAB — COMPLETE METABOLIC PANEL WITH GFR
AG Ratio: 1.3 (calc) (ref 1.0–2.5)
ALT: 10 U/L (ref 6–29)
AST: 17 U/L (ref 10–35)
Albumin: 4.4 g/dL (ref 3.6–5.1)
Alkaline phosphatase (APISO): 57 U/L (ref 37–153)
BUN: 9 mg/dL (ref 7–25)
CO2: 32 mmol/L (ref 20–32)
Calcium: 9.7 mg/dL (ref 8.6–10.4)
Chloride: 96 mmol/L — ABNORMAL LOW (ref 98–110)
Creat: 0.73 mg/dL (ref 0.60–1.00)
Globulin: 3.3 g/dL (calc) (ref 1.9–3.7)
Glucose, Bld: 104 mg/dL — ABNORMAL HIGH (ref 65–99)
Potassium: 4.2 mmol/L (ref 3.5–5.3)
Sodium: 136 mmol/L (ref 135–146)
Total Bilirubin: 0.7 mg/dL (ref 0.2–1.2)
Total Protein: 7.7 g/dL (ref 6.1–8.1)
eGFR: 88 mL/min/{1.73_m2} (ref >=60)

## 2021-11-20 LAB — LIPID PANEL
Cholesterol: 208 mg/dL — ABNORMAL HIGH (ref <200)
HDL: 72 mg/dL (ref >=50)
LDL Cholesterol (Calc): 118 mg/dL (calc) — ABNORMAL HIGH
Non-HDL Cholesterol (Calc): 136 mg/dL (calc) — ABNORMAL HIGH (ref <130)
Total CHOL/HDL Ratio: 2.9 (calc) (ref <5.0)
Triglycerides: 81 mg/dL (ref <150)

## 2021-11-20 LAB — CBC WITH DIFFERENTIAL/PLATELET
Absolute Monocytes: 554 cells/uL (ref 200–950)
Basophils Absolute: 33 cells/uL (ref 0–200)
Basophils Relative: 0.5 %
Eosinophils Absolute: 92 cells/uL (ref 15–500)
Eosinophils Relative: 1.4 %
HCT: 50.1 % — ABNORMAL HIGH (ref 35.0–45.0)
Hemoglobin: 16.6 g/dL — ABNORMAL HIGH (ref 11.7–15.5)
Lymphs Abs: 1584 cells/uL (ref 850–3900)
MCH: 29.3 pg (ref 27.0–33.0)
MCHC: 33.1 g/dL (ref 32.0–36.0)
MCV: 88.5 fL (ref 80.0–100.0)
MPV: 9.9 fL (ref 7.5–12.5)
Monocytes Relative: 8.4 %
Neutro Abs: 4336 cells/uL (ref 1500–7800)
Neutrophils Relative %: 65.7 %
Platelets: 244 10*3/uL (ref 140–400)
RBC: 5.66 10*6/uL — ABNORMAL HIGH (ref 3.80–5.10)
RDW: 13.2 % (ref 11.0–15.0)
Total Lymphocyte: 24 %
WBC: 6.6 10*3/uL (ref 3.8–10.8)

## 2021-11-20 LAB — URINALYSIS, ROUTINE W REFLEX MICROSCOPIC
Bacteria, UA: NONE SEEN /HPF
Bilirubin Urine: NEGATIVE
Glucose, UA: NEGATIVE
Ketones, ur: NEGATIVE
Nitrite: NEGATIVE
Protein, ur: NEGATIVE
Specific Gravity, Urine: 1.01 (ref 1.001–1.035)
Squamous Epithelial / HPF: NONE SEEN /HPF (ref <=5)
pH: 7.5 (ref 5.0–8.0)

## 2021-11-20 LAB — URINE CULTURE
MICRO NUMBER:: 12912826
SPECIMEN QUALITY:: ADEQUATE

## 2021-11-20 LAB — MICROSCOPIC MESSAGE

## 2021-11-20 LAB — HEMOGLOBIN A1C
Hgb A1c MFr Bld: 5.3 % of total Hgb (ref <5.7)
Mean Plasma Glucose: 105 mg/dL
eAG (mmol/L): 5.8 mmol/L

## 2021-11-20 LAB — MAGNESIUM: Magnesium: 2.2 mg/dL (ref 1.5–2.5)

## 2021-11-20 LAB — TSH: TSH: 1.31 mIU/L (ref 0.40–4.50)

## 2021-11-20 LAB — INSULIN, RANDOM: Insulin: 11.6 u[IU]/mL

## 2021-11-20 MED ORDER — LINEZOLID 600 MG PO TABS: ORAL_TABLET | ORAL | 0 refills | Status: DC

## 2021-11-20 MED ORDER — FOSFOMYCIN TROMETHAMINE 3 G PO PACK: PACK | ORAL | 0 refills | Status: DC

## 2021-11-20 MED ORDER — LEVOFLOXACIN 500 MG PO TABS: ORAL_TABLET | ORAL | 0 refills | Status: DC

## 2021-11-21 ENCOUNTER — Other Ambulatory Visit: Payer: Self-pay | Admitting: Internal Medicine

## 2021-11-26 ENCOUNTER — Encounter: Payer: Self-pay | Admitting: Adult Health

## 2021-11-26 ENCOUNTER — Ambulatory Visit (INDEPENDENT_AMBULATORY_CARE_PROVIDER_SITE_OTHER): Payer: Medicare HMO | Admitting: Adult Health

## 2021-11-26 ENCOUNTER — Other Ambulatory Visit: Payer: Self-pay

## 2021-11-26 VITALS — BP 110/72 | HR 92 | Temp 97.5°F

## 2021-11-26 DIAGNOSIS — J029 Acute pharyngitis, unspecified: Secondary | ICD-10-CM | POA: Diagnosis not present

## 2021-11-26 DIAGNOSIS — K149 Disease of tongue, unspecified: Secondary | ICD-10-CM | POA: Diagnosis not present

## 2021-11-26 NOTE — Progress Notes (Signed)
Assessment and Plan:  Vanessa French was seen today for sore throat and tongue discoloration.  Diagnoses and all orders for this visit:  Sore throat Red tongue Low suspicion of strep on exam and from HPI, recent levaquin Sounds like excess dryness, recent mild throat injury from large pill, already improving with tylenol and hydration Hold oxybutynin; given myrbetriq 50 mg samples x 2 weeks to take break off of this (long term was not possible due to cost) Hydrate, avoid alcohol mouth wash, citrus, tomato, etc Can do gentle salt water gargles and monitor  Follow up if not improving over the next few days or any new sx   Further disposition pending results of labs. Discussed med's effects and SE's.   Over 30 minutes of exam, counseling, chart review, and critical decision making was performed.   Future Appointments  Date Time Provider [place]  02/16/2022 11:00 AM [name], NP [facility] None  08/11/2022  9:00 AM [name], NP [facility] None    ------------------------------------------------------------------------------------------------------------------   HPI BP 110/72    Pulse 92    Temp (!) 97.5 F (36.4 C)    SpO2 99%  71 y.o.female presents for evaluation of red tongue and sore throat.   She reports recently completed course of levaquin 500 mg x 3 days and linezolid 500 mg BID x 5 days for recurrent/persistent enterococcus faecalis (note patient with OAB, frequent UTI following bladder tack procedure). She reports one of the abx pills was very large and at one point became "stuck" in the back of her throat and dissolved. Mouth has also been very dry and tongue sticking to the roof of her mouth (taking oxybutynin, does note this started with this med). She reports sore throat since yesterday, and unusually red tongue without mouth pain. She reports has taken tylenol and throat seems to be improving. She plans to hold oxybutynin today.   Past Medical History:   Diagnosis Date   Acute pulmonary embolism [facility]) Post Covid - July 2022 05/14/2021   Closed fracture of right proximal humerus 04/07/2012   Diverticulosis of colon 02/14/2020   Frequent UTI    GERD (gastroesophageal reflux disease)    History of pulmonary embolus (PE) 11/03/2012   Hypertension    Psoriasis    Thoracic compression fracture (HCC)      Allergies  Allergen Reactions   Penicillins Rash   Latex Rash    Current Outpatient Medications on File Prior to Visit  Medication Sig   alendronate (FOSAMAX) 70 MG tablet TAKE 1 TABLET BY MOUTH ONCE WEEKLY ON AN EMPTY STOMACH BEFORE BREAKFAST. REMAIN UPRIGHT FOR 30 MINUTES & TAKE WITH 8 OUNCES OF WATER   Ascorbic Acid (VITAMIN C/ROSE HIPS PO) Take 500 mg by mouth daily.   Bevacizumab (AVASTIN) 100 MG/4ML SOLN Inject into the vein. One injection once a month   calcipotriene (DOVONOX) 0.005 % cream Apply 1 application topically at bedtime.   calcium carbonate (TUMS EX) 750 MG chewable tablet Chew 1 tablet by mouth daily.   cetirizine (ZYRTEC) 10 MG tablet Take 10 mg by mouth as needed for allergies.   Cholecalciferol (VITAMIN D-3) 125 MCG (5000 UT) TABS Alternate taking 1 and 2 caps every other day.   CINNAMON PO Take 1,000 mg by mouth daily.   CRANBERRY PO Take by mouth. Take 2 caplets BID   DULoxetine (CYMBALTA) 60 MG capsule Take  1 capsule  Daily  for Mood & Chronic Back Pain   famotidine (PEPCID) 20 MG tablet TAKE ONE TABLET  BY MOUTH TWICE A DAY FOR INDIGESTION AND HEARTBURN   hydrochlorothiazide (HYDRODIURIL) 25 MG tablet TAKE 1/2 - 1 TABLET BY MOUTH AS NEEDED ONLY FOR BLOOD PRESSURE AND FLUID RETENTION/ ANKLE SWELLING   levofloxacin (LEVAQUIN) 500 MG tablet Take  1 tablet  Daily  for 3 days (Patient not taking: Reported on 11/26/2021)   linezolid (ZYVOX) 600 MG tablet Take 1 tablet 2 x /day ( every 12 hours ) for 5 days (Patient not taking: Reported on 11/26/2021)   Methylcobalamin (B12 SL) Place 1 tablet under the tongue See admin  instructions. 3-4 days/week   Multiple Vitamin (MULTI-VITAMIN DAILY PO) Take 1 tablet by mouth daily.   omeprazole (PRILOSEC) 20 MG capsule Take 20 mg by mouth at bedtime.   oxybutynin (DITROPAN XL) 10 MG 24 hr tablet Take  1 tablet  at Bedtime  for Bladder   pseudoephedrine (SUDAFED) 120 MG 12 hr tablet Take  1 tablet  2 x /day (every 12 hours)  for Sinus & Chest Congestion   triamcinolone cream (KENALOG) 0.1 % Apply 1 application topically 2 (two) times daily. (Patient taking differently: Apply 1 application topically as needed.)   No current facility-administered medications on file prior to visit.    ROS: all negative except above.   Physical Exam:  BP 110/72    Pulse 92    Temp (!) 97.5 F (36.4 C)    SpO2 99%   General Appearance: Well nourished, in no apparent distress. Eyes: PERRLA, conjunctiva no swelling or erythema Sinuses: No Frontal/maxillary tenderness ENT/Mouth: Ext aud canals clear, TMs without erythema, bulging. Mild erythema, swelling, or exudate on post pharynx.  Tonsils not swollen or erythematous. Mouth and tongue generalized dryness, tongue red throughout without glossy appearance, no ulcers, lesions, white coating. Good dentition. Hearing normal.  Neck: Supple Respiratory: Respiratory effort normal Cardio: Appears well perfused Lymphatics: Non tender without lymphadenopathy.  Musculoskeletal: normal gait.  Skin: Warm, dry without rashes, lesions, ecchymosis.  Neuro:  Normal muscle tone Psych: Awake and oriented X 3, normal affect, Insight and Judgment appropriate.     Izora Ribas, NP 5:29 PM [facility]

## 2021-11-27 DIAGNOSIS — L578 Other skin changes due to chronic exposure to nonionizing radiation: Secondary | ICD-10-CM | POA: Diagnosis not present

## 2021-11-27 DIAGNOSIS — D2271 Melanocytic nevi of right lower limb, including hip: Secondary | ICD-10-CM | POA: Diagnosis not present

## 2021-11-27 DIAGNOSIS — L821 Other seborrheic keratosis: Secondary | ICD-10-CM | POA: Diagnosis not present

## 2021-11-27 DIAGNOSIS — L409 Psoriasis, unspecified: Secondary | ICD-10-CM | POA: Diagnosis not present

## 2021-11-27 DIAGNOSIS — D239 Other benign neoplasm of skin, unspecified: Secondary | ICD-10-CM | POA: Diagnosis not present

## 2021-12-03 DIAGNOSIS — H353122 Nonexudative age-related macular degeneration, left eye, intermediate dry stage: Secondary | ICD-10-CM | POA: Diagnosis not present

## 2021-12-03 DIAGNOSIS — H43813 Vitreous degeneration, bilateral: Secondary | ICD-10-CM | POA: Diagnosis not present

## 2021-12-03 DIAGNOSIS — H43393 Other vitreous opacities, bilateral: Secondary | ICD-10-CM | POA: Diagnosis not present

## 2021-12-03 DIAGNOSIS — H353211 Exudative age-related macular degeneration, right eye, with active choroidal neovascularization: Secondary | ICD-10-CM | POA: Diagnosis not present

## 2021-12-11 ENCOUNTER — Other Ambulatory Visit: Payer: Self-pay | Admitting: Adult Health

## 2021-12-11 MED ORDER — SOLIFENACIN SUCCINATE 10 MG PO TABS: ORAL_TABLET | ORAL | 3 refills | Status: DC

## 2022-01-07 DIAGNOSIS — H353211 Exudative age-related macular degeneration, right eye, with active choroidal neovascularization: Secondary | ICD-10-CM | POA: Diagnosis not present

## 2022-01-07 DIAGNOSIS — H353122 Nonexudative age-related macular degeneration, left eye, intermediate dry stage: Secondary | ICD-10-CM | POA: Diagnosis not present

## 2022-01-13 DIAGNOSIS — G5622 Lesion of ulnar nerve, left upper limb: Secondary | ICD-10-CM | POA: Diagnosis not present

## 2022-01-18 DIAGNOSIS — M67432 Ganglion, left wrist: Secondary | ICD-10-CM | POA: Diagnosis not present

## 2022-01-18 DIAGNOSIS — G5622 Lesion of ulnar nerve, left upper limb: Secondary | ICD-10-CM | POA: Diagnosis not present

## 2022-01-18 DIAGNOSIS — M25832 Other specified joint disorders, left wrist: Secondary | ICD-10-CM | POA: Diagnosis not present

## 2022-02-05 DIAGNOSIS — H43813 Vitreous degeneration, bilateral: Secondary | ICD-10-CM | POA: Diagnosis not present

## 2022-02-05 DIAGNOSIS — H353211 Exudative age-related macular degeneration, right eye, with active choroidal neovascularization: Secondary | ICD-10-CM | POA: Diagnosis not present

## 2022-02-05 DIAGNOSIS — H43393 Other vitreous opacities, bilateral: Secondary | ICD-10-CM | POA: Diagnosis not present

## 2022-02-05 DIAGNOSIS — H353122 Nonexudative age-related macular degeneration, left eye, intermediate dry stage: Secondary | ICD-10-CM | POA: Diagnosis not present

## 2022-02-12 NOTE — Progress Notes (Addendum)
ANNUAL WELLNESS VISIT AND FOLLOW UP ? ? ?Assessment and Plan: ? ?Annual Medicare Wellness Visit ?Due annually  ?Health maintenance reviewed ? ?Atherosclerosis of aorta ([place]) per CT 04/2021 ?Control blood pressure, cholesterol, glucose, increase exercise.  ? ?Other acute pulmonary embolism without acute cor pulmonale (HCC) ?Sx resolved; very small PE in provoked setting ?Completed xarelto x 3 months and tapered  ?Now on ASA  ?Resolved to hx ? ?Hypertension ?Continue medication ?Monitor blood pressure at home; call if consistently over 130/80 ?Continue DASH diet.   ?Reminder to go to the ER if any CP, SOB, nausea, dizziness, severe HA, changes vision/speech, left arm numbness and tingling and jaw pain. ? ?OAB (overactive bladder) ?Improved s/p repair of bladder prolapse.  ?She benefits from vesicare 5 mg daily  ?Wears 1-2 liners/day ? ?Anxiety ?Well managed by current regimen; cymbalta 60 mg (pain with taper) ?Stress management techniques discussed, increase water, good sleep hygiene discussed, increase exercise, and increase veggies.  ? ?Hx of prediabetes ?Discussed disease and risks of elevated glucose ?Discussed diet/exercise, weight management  ?Check A1C annually; monitor serum glucose  ?  ?Overweight- BMI 28  ?Commended her on her excellent progress with 50 lb weight loss in last few years  ?She is maintaining well  ?Recommended diet heavy in fruits and veggies and low in animal meats, cheeses, and dairy products, appropriate calorie intake ?Exercise encouraged; encouraged resistance exercises ?Follow up at next visit ? ?Hyperipidemia ?Mild elevations have been working on lifestyle but trending up ?Based on updated results 10 year ASCVD risk was 9.4%, recommended to start statin; rosuvastatin 5 mg daily sent and 3 month follow up recommended ?LDL goal <100 ?Continue low cholesterol diet and exercise.  ?Check lipid panel ? ?Vitamin D deficiency ?-     VITAMIN D 25 Hydroxy (Vit-D Deficiency,  Fractures) ? ?Medication management ?-     CBC with Differential/Platelet ?-     CMP/GFR ?-     magnesium ? ?Osteopenia of multiple sites ?DEXA due 07/2022 - order placed to schedule  ?continue vit D and calcium supplements ?Back on fosamax since last check due to fracture per Dr. Lorin Mercy ?Previously on 18 months on 6 months off schedule per ortho recommendation ? ?Psoriasis ?Previously treated by methotrexate but has been tapered off by derm Dr. Jari Pigg; doing well with topical steroids ? ?GERD ?Controlled sx; continue meds ?Discussed diet, avoiding triggers and other lifestyle changes ? ?Thoracic compression fracture ([place]) ?Dr. Lorin Mercy following, kyphoplasty not recommended ?Chronic; has been 6+ months ? ?Macular degeneration ?Opth following, injections on R ?No visual barriers to ADLs ? ?B12 deficiency ?- continue supplement ? ?Ganglion cyst, L wrist ?S/p resection, resolved.  ? ?Left ulnar radicular sx ?Dr. Fredna Dow following ? ?Need for pneumonia vaccination ?- 23 valent pneumococcal vaccine administered without complication today  ? ?Microalbuminuria ?- recheck today to see if isolated vs persistent ? ? ? ? ?Orders Placed This Encounter  ?Procedures  ? DG Bone Density  ? CBC with Differential/Platelet  ? COMPLETE METABOLIC PANEL WITH GFR  ? Magnesium  ? Lipid panel  ? TSH  ? Microalbumin / creatinine urine ratio  ? VITAMIN D 25 Hydroxy (Vit-D Deficiency, Fractures)  ? ? ?Discussed med's effects and SE's. Screening labs and tests as requested with regular follow-up as recommended. ?Over 40 minutes of exam, counseling, chart review, and complex, high level critical decision making was performed this visit.  ? ?Future Appointments  ?Date Time Provider Fifty-Six  ?08/11/2022  9:00 AM [name], NP  [facility] None  ?02/17/2023 11:00 AM Magda Bernheim, NP [facility] None  ? ? ?Plan:  ? ?During the course of the visit the patient was educated and counseled about appropriate screening and preventive services  including:  ? ?Pneumococcal vaccine  ?Prevnar 13 ?Influenza vaccine ?Td vaccine ?Screening electrocardiogram ?Bone densitometry screening ?Colorectal cancer screening ?Diabetes screening ?Glaucoma screening ?Nutrition counseling  ?Advanced directives: requested ? ? ? ?HPI  ?BP 112/78   Pulse 84   Temp (!) 97.5 ?F (36.4 ?C)   Ht '5\' 3"'  (1.6 m)   Wt 164 lb (74.4 kg)   SpO2 99%   BMI 29.05 kg/m?  ? ?71 y.o. female  presents for AWV and follow up for has Anxiety; OAB (overactive bladder); History of prediabetes; Medication management; Osteopenia of multiple sites; BMI 27.0-27.9,adult; Hyperlipidemia; Vitamin D deficiency; Psoriasis; GERD (gastroesophageal reflux disease); Essential hypertension; Thoracic compression fracture ([place]); B12 deficiency; Aortic atherosclerosis ([place])- CT 04/2021; Macular degeneration of both eyes; and Ganglion cyst of wrist, left on their problem list.  ? ?She is married, 3 sons, 2 grandchildren. Newly retired Therapist, sports from our office.  Husband has Parkinson's and somewhat progressing.  ? ?She had left wrist ganglion cyst, persistent despite resection attempt by Dr. Melford Aase here, was referred to Dr. Fredna Dow and underwent resection 09/29/2021 and has done well with then. However she was having persistent ulnar radicular sx, had NCV by Dr. Tamsen Roers suggestive of ulnar entrapment, was placed on methylprednisolone taper with some improvement but not resolved per patient, has follow up in a few weeks. ? ?Hx of provoked PE after humerus fracture without recurrence after tapering off of coumadin for many years until recently 04/2021 developed exertional dyspnea and tachycardia following recovery from covid 19, + d dimer on 05/13/2021, CTA showed small LUL PE on 05/14/2021, complexted xarelto 3 months and has done well since tapering off.  ? ?She is followed by Dr. Jari Pigg for psoriasis previously on methotrexate, currently off and doing well with topical steroids PRN.  ? ?She is on vitamin D and calcium  supplements. She did have fall down stairs at home in Sept 2021, has been found to have thoracic compression fractures - T1 and T11 new, Old T3 and T12 fracture. Has seen Dr. Lorin Mercy who did not recommend kyphoplasty. She has hx of osteoporosis, currently back on fosamax since fall 2021; taking intermittently since ?2013 since humerus fracture; most recent DEXA in 09/2020 demonstrated L fem T -2.0. ?  ?Has hx of vaginal hysterectomy and had  bladder sling in 2000; had frequent UTIs in the past with cystocele and was referred to Dr. Alona Bene with [facility] and underwent anterior cystocele repair per his recommendation.  Wears a pad PRN, vesicare 5 mg dose help.  ? ?She is on Cymbalta 60 mg for mood/chronic pain benefit.  ? ?she has a diagnosis of GERD which is currently managed by famotidine 20 mg AM and omeprazole 20 mg PM daily.  ? ?BMI is Body mass index is 29.05 kg/m?., she is working on diet and exercise. She successfully lost over 50 lb with phentermine and has maintained. She is walking regularly for exercise.  ?Wt Readings from Last 3 Encounters:  ?02/16/22 164 lb (74.4 kg)  ?11/17/21 162 lb 8 oz (73.7 kg)  ?09/29/21 167 lb 5.3 oz (75.9 kg)  ? ?Her blood pressure has been controlled at home, today their BP is BP: 112/78 ?She does workout. She denies chest pain, shortness of breath, dizziness.  ? ?Recent CT 05/14/2021 showed  aortic atherosclerosis.  ? ?She is not on cholesterol medication and denies myalgias. Her cholesterol is not at goal due to low risk history and mild elevations and patiet preference. No strong family hx of CVD. The cholesterol last visit was:   ?Lab Results  ?Component Value Date  ? CHOL 208 (H) 11/17/2021  ? HDL 72 11/17/2021  ? LDLCALC 118 (H) 11/17/2021  ? TRIG 81 11/17/2021  ? CHOLHDL 2.9 11/17/2021  ? ?She has been working on diet and exercise for hx of prediabetes, she is on bASA, she is not on ACE/ARB and denies foot ulcerations, increased appetite, nausea, paresthesia of the feet,  polydipsia, polyuria, visual disturbances and vomiting. Last A1C in the office was:  ?Lab Results  ?Component Value Date  ? HGBA1C 5.3 11/17/2021  ? ?Last GFR: ?Lab Results  ?Component Value Date  ? EGFR 88 11/17/2021  ? ?Lab Results  ?C

## 2022-02-16 ENCOUNTER — Encounter: Payer: Self-pay | Admitting: Adult Health

## 2022-02-16 ENCOUNTER — Ambulatory Visit (INDEPENDENT_AMBULATORY_CARE_PROVIDER_SITE_OTHER): Payer: Medicare HMO | Admitting: Adult Health

## 2022-02-16 VITALS — BP 112/78 | HR 84 | Temp 97.5°F | Ht 63.0 in | Wt 164.0 lb

## 2022-02-16 DIAGNOSIS — E785 Hyperlipidemia, unspecified: Secondary | ICD-10-CM | POA: Diagnosis not present

## 2022-02-16 DIAGNOSIS — Z0001 Encounter for general adult medical examination with abnormal findings: Secondary | ICD-10-CM | POA: Diagnosis not present

## 2022-02-16 DIAGNOSIS — Z23 Encounter for immunization: Secondary | ICD-10-CM

## 2022-02-16 DIAGNOSIS — Z87898 Personal history of other specified conditions: Secondary | ICD-10-CM

## 2022-02-16 DIAGNOSIS — L409 Psoriasis, unspecified: Secondary | ICD-10-CM

## 2022-02-16 DIAGNOSIS — M8589 Other specified disorders of bone density and structure, multiple sites: Secondary | ICD-10-CM | POA: Diagnosis not present

## 2022-02-16 DIAGNOSIS — I10 Essential (primary) hypertension: Secondary | ICD-10-CM | POA: Diagnosis not present

## 2022-02-16 DIAGNOSIS — R6889 Other general symptoms and signs: Secondary | ICD-10-CM | POA: Diagnosis not present

## 2022-02-16 DIAGNOSIS — E559 Vitamin D deficiency, unspecified: Secondary | ICD-10-CM | POA: Diagnosis not present

## 2022-02-16 DIAGNOSIS — Z0000 Encounter for general adult medical examination without abnormal findings: Secondary | ICD-10-CM

## 2022-02-16 DIAGNOSIS — E538 Deficiency of other specified B group vitamins: Secondary | ICD-10-CM

## 2022-02-16 DIAGNOSIS — Z6828 Body mass index (BMI) 28.0-28.9, adult: Secondary | ICD-10-CM

## 2022-02-16 DIAGNOSIS — S22080S Wedge compression fracture of T11-T12 vertebra, sequela: Secondary | ICD-10-CM

## 2022-02-16 DIAGNOSIS — R69 Illness, unspecified: Secondary | ICD-10-CM | POA: Diagnosis not present

## 2022-02-16 DIAGNOSIS — N3281 Overactive bladder: Secondary | ICD-10-CM

## 2022-02-16 DIAGNOSIS — Z79899 Other long term (current) drug therapy: Secondary | ICD-10-CM | POA: Diagnosis not present

## 2022-02-16 DIAGNOSIS — Z6827 Body mass index (BMI) 27.0-27.9, adult: Secondary | ICD-10-CM

## 2022-02-16 DIAGNOSIS — F419 Anxiety disorder, unspecified: Secondary | ICD-10-CM

## 2022-02-16 DIAGNOSIS — K219 Gastro-esophageal reflux disease without esophagitis: Secondary | ICD-10-CM

## 2022-02-16 DIAGNOSIS — H3530 Unspecified macular degeneration: Secondary | ICD-10-CM

## 2022-02-16 DIAGNOSIS — M67432 Ganglion, left wrist: Secondary | ICD-10-CM

## 2022-02-16 DIAGNOSIS — R809 Proteinuria, unspecified: Secondary | ICD-10-CM

## 2022-02-16 DIAGNOSIS — I700 Atherosclerosis of aorta: Secondary | ICD-10-CM

## 2022-02-16 MED ORDER — SOLIFENACIN SUCCINATE 5 MG PO TABS
5.0000 mg | ORAL_TABLET | Freq: Every day | ORAL | 3 refills | Status: AC
Start: 2022-02-16 — End: 2023-02-16

## 2022-02-16 NOTE — Addendum Note (Signed)
Addended by: Chancy Hurter on: 02/16/2022 12:09 PM ? ? Modules accepted: Orders ? ?

## 2022-02-17 ENCOUNTER — Other Ambulatory Visit: Payer: Self-pay | Admitting: Adult Health

## 2022-02-17 ENCOUNTER — Encounter: Payer: Self-pay | Admitting: Adult Health

## 2022-02-17 LAB — CBC WITH DIFFERENTIAL/PLATELET
Absolute Monocytes: 584 cells/uL (ref 200–950)
Basophils Absolute: 37 cells/uL (ref 0–200)
Basophils Relative: 0.5 %
Eosinophils Absolute: 183 cells/uL (ref 15–500)
Eosinophils Relative: 2.5 %
HCT: 48.1 % — ABNORMAL HIGH (ref 35.0–45.0)
Hemoglobin: 15.7 g/dL — ABNORMAL HIGH (ref 11.7–15.5)
Lymphs Abs: 1840 cells/uL (ref 850–3900)
MCH: 29.8 pg (ref 27.0–33.0)
MCHC: 32.6 g/dL (ref 32.0–36.0)
MCV: 91.3 fL (ref 80.0–100.0)
MPV: 10 fL (ref 7.5–12.5)
Monocytes Relative: 8 %
Neutro Abs: 4657 cells/uL (ref 1500–7800)
Neutrophils Relative %: 63.8 %
Platelets: 240 10*3/uL (ref 140–400)
RBC: 5.27 10*6/uL — ABNORMAL HIGH (ref 3.80–5.10)
RDW: 12.5 % (ref 11.0–15.0)
Total Lymphocyte: 25.2 %
WBC: 7.3 10*3/uL (ref 3.8–10.8)

## 2022-02-17 LAB — COMPLETE METABOLIC PANEL WITH GFR
AG Ratio: 1.3 (calc) (ref 1.0–2.5)
ALT: 9 U/L (ref 6–29)
AST: 14 U/L (ref 10–35)
Albumin: 4.1 g/dL (ref 3.6–5.1)
Alkaline phosphatase (APISO): 64 U/L (ref 37–153)
BUN: 9 mg/dL (ref 7–25)
CO2: 31 mmol/L (ref 20–32)
Calcium: 9.8 mg/dL (ref 8.6–10.4)
Chloride: 101 mmol/L (ref 98–110)
Creat: 0.6 mg/dL (ref 0.60–1.00)
Globulin: 3.2 g/dL (calc) (ref 1.9–3.7)
Glucose, Bld: 100 mg/dL — ABNORMAL HIGH (ref 65–99)
Potassium: 4.7 mmol/L (ref 3.5–5.3)
Sodium: 139 mmol/L (ref 135–146)
Total Bilirubin: 0.7 mg/dL (ref 0.2–1.2)
Total Protein: 7.3 g/dL (ref 6.1–8.1)
eGFR: 97 mL/min/{1.73_m2} (ref >=60)

## 2022-02-17 LAB — MICROALBUMIN / CREATININE URINE RATIO
Creatinine, Urine: 54 mg/dL (ref 20–275)
Microalb Creat Ratio: 4 mcg/mg creat (ref <30)
Microalb, Ur: 0.2 mg/dL

## 2022-02-17 LAB — LIPID PANEL
Cholesterol: 204 mg/dL — ABNORMAL HIGH (ref <200)
HDL: 69 mg/dL (ref >=50)
LDL Cholesterol (Calc): 119 mg/dL (calc) — ABNORMAL HIGH
Non-HDL Cholesterol (Calc): 135 mg/dL (calc) — ABNORMAL HIGH (ref <130)
Total CHOL/HDL Ratio: 3 (calc) (ref <5.0)
Triglycerides: 70 mg/dL (ref <150)

## 2022-02-17 LAB — TSH: TSH: 1.52 mIU/L (ref 0.40–4.50)

## 2022-02-17 LAB — VITAMIN D 25 HYDROXY (VIT D DEFICIENCY, FRACTURES): Vit D, 25-Hydroxy: 83 ng/mL (ref 30–100)

## 2022-02-17 LAB — MAGNESIUM: Magnesium: 2.2 mg/dL (ref 1.5–2.5)

## 2022-02-17 MED ORDER — ROSUVASTATIN CALCIUM 5 MG PO TABS: ORAL_TABLET | ORAL | 3 refills | Status: DC

## 2022-02-22 ENCOUNTER — Ambulatory Visit: Payer: Medicare HMO | Admitting: Adult Health

## 2022-03-01 DIAGNOSIS — G5622 Lesion of ulnar nerve, left upper limb: Secondary | ICD-10-CM | POA: Diagnosis not present

## 2022-03-15 DIAGNOSIS — H43813 Vitreous degeneration, bilateral: Secondary | ICD-10-CM | POA: Diagnosis not present

## 2022-03-15 DIAGNOSIS — H353211 Exudative age-related macular degeneration, right eye, with active choroidal neovascularization: Secondary | ICD-10-CM | POA: Diagnosis not present

## 2022-03-15 DIAGNOSIS — H43393 Other vitreous opacities, bilateral: Secondary | ICD-10-CM | POA: Diagnosis not present

## 2022-03-15 DIAGNOSIS — H353122 Nonexudative age-related macular degeneration, left eye, intermediate dry stage: Secondary | ICD-10-CM | POA: Diagnosis not present

## 2022-04-14 DIAGNOSIS — H43393 Other vitreous opacities, bilateral: Secondary | ICD-10-CM | POA: Diagnosis not present

## 2022-04-14 DIAGNOSIS — H353122 Nonexudative age-related macular degeneration, left eye, intermediate dry stage: Secondary | ICD-10-CM | POA: Diagnosis not present

## 2022-04-14 DIAGNOSIS — H43813 Vitreous degeneration, bilateral: Secondary | ICD-10-CM | POA: Diagnosis not present

## 2022-04-14 DIAGNOSIS — H353211 Exudative age-related macular degeneration, right eye, with active choroidal neovascularization: Secondary | ICD-10-CM | POA: Diagnosis not present

## 2022-04-17 ENCOUNTER — Other Ambulatory Visit: Payer: Self-pay | Admitting: Internal Medicine

## 2022-04-17 ENCOUNTER — Other Ambulatory Visit: Payer: Self-pay | Admitting: Adult Health

## 2022-04-17 DIAGNOSIS — R609 Edema, unspecified: Secondary | ICD-10-CM

## 2022-04-17 MED ORDER — HYDROCHLOROTHIAZIDE 25 MG PO TABS: ORAL_TABLET | ORAL | 3 refills | Status: DC

## 2022-04-26 ENCOUNTER — Ambulatory Visit: Payer: Medicare HMO | Admitting: Adult Health

## 2022-05-18 ENCOUNTER — Ambulatory Visit (INDEPENDENT_AMBULATORY_CARE_PROVIDER_SITE_OTHER): Payer: Medicare HMO | Admitting: Nurse Practitioner

## 2022-05-18 ENCOUNTER — Encounter: Payer: Self-pay | Admitting: Nurse Practitioner

## 2022-05-18 VITALS — BP 114/72 | HR 113 | Temp 97.7°F | Ht 63.0 in | Wt 164.0 lb

## 2022-05-18 DIAGNOSIS — M8589 Other specified disorders of bone density and structure, multiple sites: Secondary | ICD-10-CM | POA: Diagnosis not present

## 2022-05-18 DIAGNOSIS — S22080S Wedge compression fracture of T11-T12 vertebra, sequela: Secondary | ICD-10-CM

## 2022-05-18 DIAGNOSIS — Z79899 Other long term (current) drug therapy: Secondary | ICD-10-CM

## 2022-05-18 DIAGNOSIS — E785 Hyperlipidemia, unspecified: Secondary | ICD-10-CM | POA: Diagnosis not present

## 2022-05-18 DIAGNOSIS — I10 Essential (primary) hypertension: Secondary | ICD-10-CM

## 2022-05-18 NOTE — Patient Instructions (Signed)
High Cholesterol  High cholesterol is a condition in which the blood has high levels of a white, waxy substance similar to fat (cholesterol). The liver makes all the cholesterol that the body needs. The human body needs small amounts of cholesterol to help build cells. A person gets extra or excess cholesterol from the food that he or she eats. The blood carries cholesterol from the liver to the rest of the body. If you have high cholesterol, deposits (plaques) may build up on the walls of your arteries. Arteries are the blood vessels that carry blood away from your heart. These plaques make the arteries narrow and stiff. Cholesterol plaques increase your risk for heart attack and stroke. Work with your health care provider to keep your cholesterol levels in a healthy range. What increases the risk? The following factors may make you more likely to develop this condition: Eating foods that are high in animal fat (saturated fat) or cholesterol. Being overweight. Not getting enough exercise. A family history of high cholesterol (familial hypercholesterolemia). Use of tobacco products. Having diabetes. What are the signs or symptoms? In most cases, high cholesterol does not usually cause any symptoms. In severe cases, very high cholesterol levels can cause: Fatty bumps under the skin (xanthomas). A white or gray ring around the black center (pupil) of the eye. How is this diagnosed? This condition may be diagnosed based on the results of a blood test. If you are older than 70 years of age, your health care provider may check your cholesterol levels every 4-6 years. You may be checked more often if you have high cholesterol or other risk factors for heart disease. The blood test for cholesterol measures: "Bad" cholesterol, or LDL cholesterol. This is the main type of cholesterol that causes heart disease. The desired level is less than 100 mg/dL (2.59 mmol/L). "Good" cholesterol, or HDL  cholesterol. HDL helps protect against heart disease by cleaning the arteries and carrying the LDL to the liver for processing. The desired level for HDL is 60 mg/dL (1.55 mmol/L) or higher. Triglycerides. These are fats that your body can store or burn for energy. The desired level is less than 150 mg/dL (1.69 mmol/L). Total cholesterol. This measures the total amount of cholesterol in your blood and includes LDL, HDL, and triglycerides. The desired level is less than 200 mg/dL (5.17 mmol/L). How is this treated? Treatment for high cholesterol starts with lifestyle changes, such as diet and exercise. Diet changes. You may be asked to eat foods that have more fiber and less saturated fats or added sugar. Lifestyle changes. These may include regular exercise, maintaining a healthy weight, and quitting use of tobacco products. Medicines. These are given when diet and lifestyle changes have not worked. You may be prescribed a statin medicine to help lower your cholesterol levels. Follow these instructions at home: Eating and drinking  Eat a healthy, balanced diet. This diet includes: Daily servings of a variety of fresh, frozen, or canned fruits and vegetables. Daily servings of whole grain foods that are rich in fiber. Foods that are low in saturated fats and trans fats. These include poultry and fish without skin, lean cuts of meat, and low-fat dairy products. A variety of fish, especially oily fish that contain omega-3 fatty acids. Aim to eat fish at least 2 times a week. Avoid foods and drinks that have added sugar. Use healthy cooking methods, such as roasting, grilling, broiling, baking, poaching, steaming, and stir-frying. Do not fry your food except for   stir-frying. If you drink alcohol: Limit how much you have to: 0-1 drink a day for women who are not pregnant. 0-2 drinks a day for men. Know how much alcohol is in a drink. In the [place] one drink equals one 12 oz bottle of beer (355 mL),  one 5 oz glass of wine (148 mL), or one 1 oz glass of hard liquor (44 mL). Lifestyle  Get regular exercise. Aim to exercise for a total of 150 minutes a week. Increase your activity level by doing activities such as gardening, walking, and taking the stairs. Do not use any products that contain nicotine or tobacco. These products include cigarettes, chewing tobacco, and vaping devices, such as e-cigarettes. If you need help quitting, ask your health care provider. General instructions Take over-the-counter and prescription medicines only as told by your health care provider. Keep all follow-up visits. This is important. Where to find more information American Heart Association: www.heart.org National Heart, Lung, and Blood Institute: https://wilson-eaton.com/ Contact a health care provider if: You have trouble achieving or maintaining a healthy diet or weight. You are starting an exercise program. You are unable to stop smoking. Get help right away if: You have chest pain. You have trouble breathing. You have discomfort or pain in your jaw, neck, back, shoulder, or arm. You have any symptoms of a stroke. "BE FAST" is an easy way to remember the main warning signs of a stroke: B - Balance. Signs are dizziness, sudden trouble walking, or loss of balance. E - Eyes. Signs are trouble seeing or a sudden change in vision. F - Face. Signs are sudden weakness or numbness of the face, or the face or eyelid drooping on one side. A - Arms. Signs are weakness or numbness in an arm. This happens suddenly and usually on one side of the body. S - Speech. Signs are sudden trouble speaking, slurred speech, or trouble understanding what people say. T - Time. Time to call emergency services. Write down what time symptoms started. You have other signs of a stroke, such as: A sudden, severe headache with no known cause. Nausea or vomiting. Seizure. These symptoms may represent a serious problem that is an  emergency. Do not wait to see if the symptoms will go away. Get medical help right away. Call your local emergency services (911 in the [place] Do not drive yourself to the hospital. Summary Cholesterol plaques increase your risk for heart attack and stroke. Work with your health care provider to keep your cholesterol levels in a healthy range. Eat a healthy, balanced diet, get regular exercise, and maintain a healthy weight. Do not use any products that contain nicotine or tobacco. These products include cigarettes, chewing tobacco, and vaping devices, such as e-cigarettes. Get help right away if you have any symptoms of a stroke. This information is not intended to replace advice given to you by your health care provider. Make sure you discuss any questions you have with your health care provider. Document Revised: 12/25/2020 Document Reviewed: 12/15/2020 Elsevier Patient Education  [place].   Rosuvastatin Tablets What is this medication? ROSUVASTATIN (roe SOO va sta tin) treats high cholesterol and reduces the risk of heart attack and stroke. It works by decreasing bad cholesterol and fats (such as LDL, triglycerides), and increasing good cholesterol (HDL) in your blood. It belongs to a group of medications called statins. Changes to diet and exercise are often combined with this medication. This medicine may be used for other purposes; ask  your health care provider or pharmacist if you have questions. COMMON BRAND NAME(S): Crestor What should I tell my care team before I take this medication? They need to know if you have any of these conditions: Diabetes (high blood sugar) If you often drink alcohol Kidney disease Liver disease Muscle cramps, pain Stroke Thyroid disease An unusual or allergic reaction to rosuvastatin, other medications, foods, dyes, or preservatives Pregnant or trying to get pregnant Breast-feeding How should I use this medication? Take this medication by  mouth with a glass of water. Follow the directions on the prescription label. You can take it with or without food. If it upsets your stomach, take it with food. Do not cut, crush or chew this medication. Swallow the tablets whole. Take your medication at regular intervals. Do not take it more often than directed. Take antacids that have a combination of aluminum and magnesium hydroxide in them at a different time of day than this medication. Take these products 2 hours AFTER this medication. Talk to your care team about the use of this medication in children. While this medication may be prescribed for children as young as 7 for selected conditions, precautions do apply. Overdosage: If you think you have taken too much of this medicine contact a poison control center or emergency room at once. NOTE: This medicine is only for you. Do not share this medicine with others. What if I miss a dose? If you miss a dose, take it as soon as you can. If your next dose is to be taken in less than 12 hours, then do not take the missed dose. Take the next dose at your regular time. Do not take double or extra doses. What may interact with this medication? Do not take this medication with any of the following: Supplements like red yeast rice This medication may also interact with the following: Alcohol Antacids containing aluminum hydroxide and magnesium hydroxide Cyclosporine Other medications for high cholesterol Some medications for HIV infection Warfarin This list may not describe all possible interactions. Give your health care provider a list of all the medicines, herbs, non-prescription drugs, or dietary supplements you use. Also tell them if you smoke, drink alcohol, or use illegal drugs. Some items may interact with your medicine. What should I watch for while using this medication? Visit your health care provider for regular checks on your progress. Tell your health care provider if your symptoms do  not start to get better or if they get worse. Your health care provider may tell you to stop taking this medication if you develop muscle problems. If your muscle problems do not go away after stopping this medication, contact your health care provider. Do not become pregnant while taking this medication. Women should inform their health care provider if they wish to become pregnant or think they might be pregnant. There is potential for serious harm to an unborn child. Talk to your health care provider for more information. Do not breast-feed an infant while taking this medication. This medication may increase blood sugar. Ask your health care provider if changes in diet or medications are needed if you have diabetes. If you are going to need surgery or other procedure, tell your health care provider that you are using this medication. Taking this medication is only part of a total heart healthy program. Your health care provider may give you a special diet to follow. Avoid alcohol. Avoid smoking. Ask your health care provider how much you should exercise. What  side effects may I notice from receiving this medication? Side effects that you should report to your care team as soon as possible: Allergic reactions--skin rash, itching, hives, swelling of the face, lips, tongue, or throat High blood sugar (hyperglycemia)--increased thirst or amount of urine, unusual weakness, fatigue, blurry vision Liver injury--right upper belly pain, loss of appetite, nausea, light-colored stool, dark yellow or brown urine, yellowing skin or eyes, unusual weakness, fatigue Muscle injury--unusual weakness, fatigue, muscle pain, dark yellow or brown urine, decrease in amount of urine Redness, blistering, peeling or loosening of the skin, including inside the mouth Side effects that usually do not require medical attention (report to your care team if they continue or are bothersome): Fatigue Headache Nausea Stomach  pain This list may not describe all possible side effects. Call your doctor for medical advice about side effects. You may report side effects to FDA at 1-800-FDA-1088. Where should I keep my medication? Keep out of the reach of children and pets. Store between 20 and 25 degrees C (68 and 77 degrees F). Get rid of any unused medication after the expiration date. To get rid of medications that are no longer needed or have expired: Take the medication to a medication take-back program. Check with your pharmacy or law enforcement to find a location. If you cannot return the medication, check the label or package insert to see if the medication should be thrown out in the garbage or flushed down the toilet. If you are not sure, ask your care team. If it is safe to put it in the trash, take the medication out of the container. Mix the medication with cat litter, dirt, coffee grounds, or other unwanted substance. Seal the mixture in a bag or container. Put it in the trash. NOTE: This sheet is a summary. It may not cover all possible information. If you have questions about this medicine, talk to your doctor, pharmacist, or health care provider.  2023 Elsevier/Gold Standard (2020-11-07 00:00:00)

## 2022-05-18 NOTE — Progress Notes (Signed)
Started HCTZ Saw Dr. Iona Hansen  for mac deg Saw Dr. Fredna Dow for Left Ulnar nerve (numb/ting post cyst excision) left wrisstt entrapment - steroid taper -re-check levels and doing good on new med Crestor. Aldendronate/Fosamax 18 mo on and 6 mo off regimen originally Sometimes 2 yr on and 2 yr off or 3 yr on 1 yr off Dexa due 07/2022  Thoracic compression fracture - kyphoplasty not reommended.  Chronic - has been 6 m0   BMI is Body mass index is 29.05 kg/m., she {HAS HAS [id number]} been working on diet and exercise. Wt Readings from Last 3 Encounters:  05/18/22 164 lb (74.4 kg)  02/16/22 164 lb (74.4 kg)  11/17/21 162 lb 8 oz (73.7 kg)

## 2022-05-19 ENCOUNTER — Encounter: Payer: Self-pay | Admitting: Nurse Practitioner

## 2022-05-19 LAB — CBC WITH DIFFERENTIAL/PLATELET
Absolute Monocytes: 909 cells/uL (ref 200–950)
Basophils Absolute: 35 cells/uL (ref 0–200)
Basophils Relative: 0.3 %
Eosinophils Absolute: 94 cells/uL (ref 15–500)
Eosinophils Relative: 0.8 %
HCT: 49 % — ABNORMAL HIGH (ref 35.0–45.0)
Hemoglobin: 16.5 g/dL — ABNORMAL HIGH (ref 11.7–15.5)
Lymphs Abs: 1723 cells/uL (ref 850–3900)
MCH: 30.3 pg (ref 27.0–33.0)
MCHC: 33.7 g/dL (ref 32.0–36.0)
MCV: 90.1 fL (ref 80.0–100.0)
MPV: 9.6 fL (ref 7.5–12.5)
Monocytes Relative: 7.7 %
Neutro Abs: 9039 cells/uL — ABNORMAL HIGH (ref 1500–7800)
Neutrophils Relative %: 76.6 %
Platelets: 216 10*3/uL (ref 140–400)
RBC: 5.44 10*6/uL — ABNORMAL HIGH (ref 3.80–5.10)
RDW: 12.3 % (ref 11.0–15.0)
Total Lymphocyte: 14.6 %
WBC: 11.8 10*3/uL — ABNORMAL HIGH (ref 3.8–10.8)

## 2022-05-19 LAB — COMPLETE METABOLIC PANEL WITH GFR
AG Ratio: 1.3 (calc) (ref 1.0–2.5)
ALT: 12 U/L (ref 6–29)
AST: 13 U/L (ref 10–35)
Albumin: 4.3 g/dL (ref 3.6–5.1)
Alkaline phosphatase (APISO): 63 U/L (ref 37–153)
BUN: 13 mg/dL (ref 7–25)
CO2: 28 mmol/L (ref 20–32)
Calcium: 9.7 mg/dL (ref 8.6–10.4)
Chloride: 97 mmol/L — ABNORMAL LOW (ref 98–110)
Creat: 0.68 mg/dL (ref 0.60–1.00)
Globulin: 3.2 g/dL (calc) (ref 1.9–3.7)
Glucose, Bld: 91 mg/dL (ref 65–99)
Potassium: 4.4 mmol/L (ref 3.5–5.3)
Sodium: 137 mmol/L (ref 135–146)
Total Bilirubin: 0.6 mg/dL (ref 0.2–1.2)
Total Protein: 7.5 g/dL (ref 6.1–8.1)
eGFR: 94 mL/min/{1.73_m2} (ref >=60)

## 2022-05-19 LAB — LIPID PANEL
Cholesterol: 148 mg/dL (ref <200)
HDL: 68 mg/dL (ref >=50)
LDL Cholesterol (Calc): 65 mg/dL (calc)
Non-HDL Cholesterol (Calc): 80 mg/dL (calc) (ref <130)
Total CHOL/HDL Ratio: 2.2 (calc) (ref <5.0)
Triglycerides: 72 mg/dL (ref <150)

## 2022-05-20 DIAGNOSIS — H353211 Exudative age-related macular degeneration, right eye, with active choroidal neovascularization: Secondary | ICD-10-CM | POA: Diagnosis not present

## 2022-05-20 DIAGNOSIS — H43813 Vitreous degeneration, bilateral: Secondary | ICD-10-CM | POA: Diagnosis not present

## 2022-05-20 DIAGNOSIS — H35033 Hypertensive retinopathy, bilateral: Secondary | ICD-10-CM | POA: Diagnosis not present

## 2022-05-20 DIAGNOSIS — H43393 Other vitreous opacities, bilateral: Secondary | ICD-10-CM | POA: Diagnosis not present

## 2022-05-20 DIAGNOSIS — H353122 Nonexudative age-related macular degeneration, left eye, intermediate dry stage: Secondary | ICD-10-CM | POA: Diagnosis not present

## 2022-06-14 ENCOUNTER — Other Ambulatory Visit: Payer: Self-pay

## 2022-06-14 MED ORDER — DULOXETINE HCL 60 MG PO CPEP
ORAL_CAPSULE | ORAL | 3 refills | Status: DC
Start: 2022-06-14 — End: 2023-06-09

## 2022-06-30 DIAGNOSIS — Z961 Presence of intraocular lens: Secondary | ICD-10-CM | POA: Diagnosis not present

## 2022-06-30 DIAGNOSIS — H353212 Exudative age-related macular degeneration, right eye, with inactive choroidal neovascularization: Secondary | ICD-10-CM | POA: Diagnosis not present

## 2022-06-30 DIAGNOSIS — H524 Presbyopia: Secondary | ICD-10-CM | POA: Diagnosis not present

## 2022-07-01 DIAGNOSIS — H353211 Exudative age-related macular degeneration, right eye, with active choroidal neovascularization: Secondary | ICD-10-CM | POA: Diagnosis not present

## 2022-07-01 DIAGNOSIS — H35033 Hypertensive retinopathy, bilateral: Secondary | ICD-10-CM | POA: Diagnosis not present

## 2022-07-01 DIAGNOSIS — H43393 Other vitreous opacities, bilateral: Secondary | ICD-10-CM | POA: Diagnosis not present

## 2022-07-01 DIAGNOSIS — H353122 Nonexudative age-related macular degeneration, left eye, intermediate dry stage: Secondary | ICD-10-CM | POA: Diagnosis not present

## 2022-07-01 DIAGNOSIS — H43813 Vitreous degeneration, bilateral: Secondary | ICD-10-CM | POA: Diagnosis not present

## 2022-07-21 ENCOUNTER — Encounter: Payer: Self-pay | Admitting: Internal Medicine

## 2022-08-11 ENCOUNTER — Other Ambulatory Visit: Payer: Self-pay | Admitting: Nurse Practitioner

## 2022-08-11 ENCOUNTER — Encounter: Payer: Self-pay | Admitting: Nurse Practitioner

## 2022-08-11 ENCOUNTER — Encounter: Payer: Medicare HMO | Admitting: Adult Health

## 2022-08-11 ENCOUNTER — Ambulatory Visit (INDEPENDENT_AMBULATORY_CARE_PROVIDER_SITE_OTHER): Payer: Medicare HMO | Admitting: Nurse Practitioner

## 2022-08-11 VITALS — BP 108/70 | HR 93 | Temp 97.3°F | Ht 63.5 in | Wt 163.0 lb

## 2022-08-11 DIAGNOSIS — N3281 Overactive bladder: Secondary | ICD-10-CM

## 2022-08-11 DIAGNOSIS — S22080S Wedge compression fracture of T11-T12 vertebra, sequela: Secondary | ICD-10-CM

## 2022-08-11 DIAGNOSIS — R232 Flushing: Secondary | ICD-10-CM

## 2022-08-11 DIAGNOSIS — Z1389 Encounter for screening for other disorder: Secondary | ICD-10-CM

## 2022-08-11 DIAGNOSIS — Z0000 Encounter for general adult medical examination without abnormal findings: Secondary | ICD-10-CM

## 2022-08-11 DIAGNOSIS — E538 Deficiency of other specified B group vitamins: Secondary | ICD-10-CM

## 2022-08-11 DIAGNOSIS — Z23 Encounter for immunization: Secondary | ICD-10-CM | POA: Diagnosis not present

## 2022-08-11 DIAGNOSIS — Z87898 Personal history of other specified conditions: Secondary | ICD-10-CM | POA: Diagnosis not present

## 2022-08-11 DIAGNOSIS — Z0001 Encounter for general adult medical examination with abnormal findings: Secondary | ICD-10-CM

## 2022-08-11 DIAGNOSIS — Z79899 Other long term (current) drug therapy: Secondary | ICD-10-CM

## 2022-08-11 DIAGNOSIS — H3530 Unspecified macular degeneration: Secondary | ICD-10-CM

## 2022-08-11 DIAGNOSIS — Z136 Encounter for screening for cardiovascular disorders: Secondary | ICD-10-CM

## 2022-08-11 DIAGNOSIS — M8589 Other specified disorders of bone density and structure, multiple sites: Secondary | ICD-10-CM

## 2022-08-11 DIAGNOSIS — E782 Mixed hyperlipidemia: Secondary | ICD-10-CM | POA: Diagnosis not present

## 2022-08-11 DIAGNOSIS — E559 Vitamin D deficiency, unspecified: Secondary | ICD-10-CM | POA: Diagnosis not present

## 2022-08-11 DIAGNOSIS — F419 Anxiety disorder, unspecified: Secondary | ICD-10-CM

## 2022-08-11 DIAGNOSIS — I700 Atherosclerosis of aorta: Secondary | ICD-10-CM

## 2022-08-11 DIAGNOSIS — K219 Gastro-esophageal reflux disease without esophagitis: Secondary | ICD-10-CM

## 2022-08-11 DIAGNOSIS — Z1329 Encounter for screening for other suspected endocrine disorder: Secondary | ICD-10-CM

## 2022-08-11 DIAGNOSIS — I2699 Other pulmonary embolism without acute cor pulmonale: Secondary | ICD-10-CM

## 2022-08-11 DIAGNOSIS — L409 Psoriasis, unspecified: Secondary | ICD-10-CM

## 2022-08-11 DIAGNOSIS — I10 Essential (primary) hypertension: Secondary | ICD-10-CM | POA: Diagnosis not present

## 2022-08-11 DIAGNOSIS — E663 Overweight: Secondary | ICD-10-CM

## 2022-08-11 NOTE — Progress Notes (Signed)
CPE  Assessment and Plan:  Annual Physical Due annually  Atherosclerosis of aorta ([place]) per CT 04/2021 Control blood pressure, cholesterol, glucose, increase exercise.   Other acute pulmonary embolism without acute cor pulmonale (HCC) Sx resolved; very small PE in provoked setting Completed xarelto x 3 months and tapered  Continue bASA   Hypertension Discussed DASH (Dietary Approaches to Stop Hypertension) DASH diet is lower in sodium than a typical American diet. Cut back on foods that are high in saturated fat, cholesterol, and trans fats. Eat more whole-grain foods, fish, poultry, and nuts Remain active and exercise as tolerated daily.  Monitor BP at home-Call if greater than 130/80.  Check CMP/CBC   OAB (overactive bladder) Improved s/p repair of bladder prolapse.  Continue Vesicare 5 mg daily  Continue to monitor  Anxiety Continue Cymbalta 60 mg  Stress management techniques discussed, increase water, good sleep hygiene discussed, increase exercise, and increase veggies.   Hx of prediabetes Education: Reviewed 'ABCs' of diabetes management  Discussed goals to be met and/or maintained include A1C (<7) Blood pressure (<130/80) Cholesterol (LDL <70) Continue Eye Exam yearly  Continue Dental Exam Q6 mo Discussed dietary recommendations Discussed Physical Activity recommendations Check A1C    Overweight- BMI 28  Discussed appropriate BMI Goal of losing 1 lb per month. Diet modification. Physical activity. Encouraged/praised to build confidence.  Hyperipidemia Discussed lifestyle modifications. Recommended diet heavy in fruits and veggies, omega 3's. Decrease consumption of animal meats, cheeses, and dairy products. Remain active and exercise as tolerated. Continue to monitor. Check and monitor lipids/TSH   Vitamin D deficiency Continue supplement Monitor and check levels  Medication management All medications discussed and reviewed in full. All  questions and concerns regarding medications addressed.    Osteopenia of multiple sites Fosomax on hold for 6 mo. DEXA scheduled for Monday 10/26 Pursue a combination of weight-bearing exercises and strength training. Advised on fall prevention measures including proper lighting in all rooms, removal of area rugs and floor clutter, use of walking devices as deemed appropriate, avoidance of uneven walking surfaces. Smoking cessation and moderate alcohol consumption if applicable Consume 017 to 1000 IU of vitamin D daily with a goal vitamin D serum value of 30 ng/mL or higher. Aim for 1000 to 1200 mg of elemental calcium daily through supplements and/or dietary sources.  Psoriasis Continue to follow with Dr. Jari Pigg;  Continue topical steroids Desonide 0.05% QD  GERD No suspected reflux complications ([place]/stricture). Lifestyle modification:  wt loss, avoid meals 2-3h before bedtime. Consider eliminating food triggers:  chocolate, caffeine, EtOH, acid/spicy food.   Thoracic compression fracture [facility]) Dr. Lorin Mercy following, kyphoplasty not recommended Chronic; has been 6+ months Continue to monitor  Macular degeneration Opth following, injections on R -Dr. Ernst Breach - every month Vabysmo - 3rd one, just started.   No visual barriers to ADLs  B12 deficiency Continue supplement Monitor levels  Need for flu vaccination Administered Patient tolerated well  Screening for heart disease Monitor EKG  Screening for thyroid disorder Check and monitor TSH  Screening for hematuria or proteinuria Check and monitor UA  [place] Continue to monitor  Orders Placed This Encounter  Procedures   Flu vaccine HIGH DOSE PF   CBC with Differential/Platelet   COMPLETE METABOLIC PANEL WITH GFR   Magnesium   Lipid panel   TSH   Hemoglobin A1c   Insulin, random   VITAMIN D 25 Hydroxy (Vit-D Deficiency, Fractures)   Urinalysis, Routine w reflex microscopic   Microalbumin /  creatinine  urine ratio   Vitamin B12   EKG 12-Lead     Notify office for further evaluation and treatment, questions or concerns if any reported s/s fail to improve.   The patient was advised to call back or seek an in-person evaluation if any symptoms worsen or if the condition fails to improve as anticipated.   Further disposition pending results of labs. Discussed med's effects and SE's.    I discussed the assessment and treatment plan with the patient. The patient was provided an opportunity to ask questions and all were answered. The patient agreed with the plan and demonstrated an understanding of the instructions.  Discussed med's effects and SE's. Screening labs and tests as requested with regular follow-up as recommended.  I provided 30 minutes of face-to-face time during this encounter including counseling, chart review, and critical decision making was preformed.   Future Appointments  Date Time Provider [place]  08/16/2022 11:00 AM [facility] DX DEXA 1 [facility]  02/17/2023 11:00 AM Alycia Rossetti, NP [facility] None    Plan:   During the course of the visit the patient was educated and counseled about appropriate screening and preventive services including:   Pneumococcal vaccine  Prevnar 13 Influenza vaccine Td vaccine Screening electrocardiogram Bone densitometry screening Colorectal cancer screening Diabetes screening Glaucoma screening Nutrition counseling  Advanced directives: requested    HPI  BP 108/70   Pulse 93   Temp (!) 97.3 F (36.3 C)   Ht 5' 3.5" (1.613 m)   Wt 163 lb (73.9 kg)   SpO2 97%   BMI 28.42 kg/m   71 y.o. female  presents for annual CPE and follow up for has Anxiety; OAB (overactive bladder); History of prediabetes; Medication management; Osteopenia of multiple sites; BMI 28.0-28.9,adult; Hyperlipidemia; Vitamin D deficiency; Psoriasis; GERD (gastroesophageal reflux disease); Essential hypertension;  Thoracic compression fracture ([place]); B12 deficiency; Aortic atherosclerosis ([place])- CT 04/2021; and Macular degeneration of both eyes on their problem list.   She is married, 3 sons, 2 grandchildren. Retired Therapist, sports from our office.  Husband has Parkinson's and somewhat progressing.  She continues to enjoy getting out and about with her friends and husband.    Hx of provoked PE after humerus fracture without recurrence after tapering off of coumadin for many years until recently 04/2021 developed exertional dyspnea and tachycardia following recovery from covid 19, + d dimer on 05/13/2021, CTA showed small LUL PE on 05/14/2021, complexted xarelto 3 months and has done well since tapering off. She continues bASA.  She is followed by Dr. Jari Pigg for psoriasis previously on methotrexate, currently off and doing well with topical steroids PRN.   She is on vitamin D and calcium supplements. She did have fall down stairs at home in Sept 2021, has been found to have thoracic compression fractures - T1 and T11 new, Old T3 and T12 fracture. Has seen Dr. Lorin Mercy who did not recommend kyphoplasty. She has hx of osteoporosis, currently fosamax on hold for 6 mo.  Has DEXA scheduled for this Monday 08/16/22.  Has hx of vaginal hysterectomy and had  bladder sling in 2000; had frequent UTIs in the past with cystocele and was referred to Dr. Alona Bene with [facility] and underwent anterior cystocele repair per his recommendation.  Wears a pad PRN, vesicare 5 mg dose help.   She is on Cymbalta 60 mg for mood/chronic pain benefit.   she has a diagnosis of GERD which is currently managed by famotidine 20 mg AM and  omeprazole 20 mg PM daily.   BMI is Body mass index is 28.42 kg/m., she is working on diet and exercise.  Wt Readings from Last 3 Encounters:  08/11/22 163 lb (73.9 kg)  05/18/22 164 lb (74.4 kg)  02/16/22 164 lb (74.4 kg)   Her blood pressure has been controlled at home, today their BP is BP: 108/70 She does  workout. She denies chest pain, shortness of breath, dizziness.   Recent CT 05/14/2021 showed aortic atherosclerosis.   She is on cholesterol medication Rosuvastatin and denies myalgias. Her cholesterol is not at goal due to low risk history and mild elevations and patiet preference. No strong family hx of CVD. The cholesterol last visit was:   Lab Results  Component Value Date   CHOL 148 05/18/2022   HDL 68 05/18/2022   LDLCALC 65 05/18/2022   TRIG 72 05/18/2022   CHOLHDL 2.2 05/18/2022   She has been working on diet and exercise for hx of prediabetes, she is on bASA, she is not on ACE/ARB and denies foot ulcerations, increased appetite, nausea, paresthesia of the feet, polydipsia, polyuria, visual disturbances and vomiting. Last A1C in the office was:  Lab Results  Component Value Date   HGBA1C 5.3 11/17/2021   Last GFR: Lab Results  Component Value Date   EGFR 94 05/18/2022   Lab Results  Component Value Date   MICRALBCREAT 4 02/16/2022   MICRALBCREAT 41 (H) 08/11/2021   MICRALBCREAT 18 08/06/2020   Patient is on Vitamin D supplement, has reduced to alternating 5000 IU daily.  Lab Results  Component Value Date   VD25OH 37 02/16/2022     She is newly on B12 SL, 1000 mcg daily.  Lab Results  Component Value Date   VZDGLOVF64 332 08/11/2021     Current Medications:  Current Outpatient Medications on File Prior to Visit  Medication Sig Dispense Refill   Ascorbic Acid (VITAMIN C/ROSE HIPS PO) Take 500 mg by mouth daily.     calcipotriene (DOVONOX) 0.005 % cream Apply 1 application topically at bedtime.     calcium carbonate (TUMS EX) 750 MG chewable tablet Chew 1 tablet by mouth daily.     cetirizine (ZYRTEC) 10 MG tablet Take 10 mg by mouth as needed for allergies.     Cholecalciferol (VITAMIN D-3) 125 MCG (5000 UT) TABS Alternate taking 1 and 2 caps every other day. (Patient taking differently: Take one tablet daily) 30 tablet 0   CINNAMON PO Take 1,000 mg by mouth  daily.     CRANBERRY PO Take by mouth. Take 2 caplets BID     desonide (DESOWEN) 0.05 % ointment Apply 1 Application topically as needed.     DULoxetine (CYMBALTA) 60 MG capsule Take  1 capsule  Daily  for Mood & Chronic Back Pain 90 capsule 3   famotidine (PEPCID) 20 MG tablet TAKE ONE TABLET BY MOUTH TWICE A DAY FOR INDIGESTION AND HEARTBURN 180 tablet 1   Faricimab-svoa (VABYSMO IZ) by Intravitreal route every 30 (thirty) days.     hydrochlorothiazide (HYDRODIURIL) 25 MG tablet Take  1 tablet  Daily  as Needed for BP & Fluid Retention / Ankle Swelling                     /            TAKE  BY                         MOUTH (Patient taking differently: Take 1/2-1 tablet daily) 90 tablet 3   Methylcobalamin (B12 SL) Place 1 tablet under the tongue See admin instructions. Take one tablet (1050m) daily     Multiple Vitamin (MULTI-VITAMIN DAILY PO) Take 1 tablet by mouth daily.     omeprazole (PRILOSEC) 20 MG capsule Take 20 mg by mouth at bedtime.     pseudoephedrine (SUDAFED) 120 MG 12 hr tablet Take  1 tablet  2 x /day (every 12 hours)  for Sinus & Chest Congestion 60 tablet 3   rosuvastatin (CRESTOR) 5 MG tablet Take 1 tab daily for cholesterol. 90 tablet 3   solifenacin (VESICARE) 5 MG tablet Take 1 tablet (5 mg total) by mouth daily. 90 tablet 3   triamcinolone cream (KENALOG) 0.1 % Apply 1 application topically 2 (two) times daily. (Patient taking differently: Apply 1 application  topically as needed.) 80 g 1   Aflibercept (EYLEA) 2 MG/0.05ML SOSY by Intravitreal route every 30 (thirty) days.     alendronate (FOSAMAX) 70 MG tablet TAKE 1 TABLET BY MOUTH ONCE WEEKLY ON AN EMPTY STOMACH BEFORE BREAKFAST. REMAIN UPRIGHT FOR 30 MINUTES & TAKE WITH 8 OUNCES OF WATER (Patient not taking: Reported on 05/18/2022) 12 tablet 3   nitrofurantoin, macrocrystal-monohydrate, (MACROBID) 100 MG capsule Take 100 mg by mouth daily. UTI prophylaxis (Patient not taking: Reported on 05/18/2022)      No current facility-administered medications on file prior to visit.   Allergies:  Allergies  Allergen Reactions   Penicillins Rash   Latex Rash   Medical History:  She has Anxiety; OAB (overactive bladder); History of prediabetes; Medication management; Osteopenia of multiple sites; BMI 28.0-28.9,adult; Hyperlipidemia; Vitamin D deficiency; Psoriasis; GERD (gastroesophageal reflux disease); Essential hypertension; [place] B12 deficiency; [place]; and Macular degeneration of both eyes on their problem list.  Health Maintenance:   Immunization History  Administered Date(s) Administered   Hepatitis B 01/11/2013   Influenza Split 07/25/2012, 08/12/2014, 08/21/2015   Influenza, High Dose Seasonal PF 08/02/2017, 07/13/2018, 08/03/2019, 08/06/2020, 08/11/2021   Influenza,inj,quad, With Preservative 08/13/2016   PFIZER(Purple Top)SARS-COV-2 Vaccination 12/23/2019, 01/16/2020, 09/20/2020, 09/10/2021   PPD Test 07/31/2014, 07/28/2015, 08/13/2016, 07/13/2018   Pneumococcal Conjugate-13 12/15/2017   Pneumococcal Polysaccharide-23 04/16/2002, 02/16/2022   Tdap 12/21/2011   Zoster, Live 10/26/2003   Health Maintenance  Topic Date Due   Zoster Vaccines- Shingrix (1 of 2) Never done   COVID-19 Vaccine (5 - Pfizer risk series) 11/05/2021   INFLUENZA VACCINE  05/25/2022   TETANUS/TDAP  08/05/2023   MAMMOGRAM  11/06/2023   COLONOSCOPY (Pts 45-418yrInsurance coverage will need to be confirmed)  08/26/2024   Pneumonia Vaccine [age]Years old  Completed   DEXA SCAN  Completed   Hepatitis C Screening  Completed   HPV VACCINES  Aged Out   Zostavax: 2005, plans to get shingrix at pharmacy  Pneum: 23 valent   Last colonoscopy: 08/27/2019, 5 year follow up, family history   Pap: S/p hysterectomy, [facility]. Mammogram: 10/2021 Dexa: 07/2020 T-2.0 Due   Names of Other Physician/Practitioners you  currently use: 1. [facility] here for primary care 2. Dr. LyPrudencio Burlyeye doctor, last visit 05/2022, macular degeneration, seeing Dr. HaClovia CuffR is wet, L dry, getting injections, monthly visits 3. Dr. McBarrie Dunkerdentist,  last visit 2023, goes q72m3. Dr. GDelman Cheadle derm, psoriasis, now seeing annually 10/2021  Patient Care Team: MUnk Pinto MD as PCP - General (Internal Medicine) GJari Pigg MD as Consulting Physician (Dermatology) YMarybelle Killings MD as Consulting Physician (Orthopedic Surgery)  Surgical History:  She has a past surgical history that includes Tonsillectomy (1962); Appendectomy (1959); Bladder suspension (11/1998); Ureter revision (Left, 11/3003); Extensor tendon of forearm / wrist repair (Left, 2010); ORIF humerus fracture (04/07/2012); Steriod injection (04/07/2012); Breast cyst aspiration (Right, 2011); vaginal hysterectomy with unilateral salpingooopherectomy (2000); Cataract extraction, bilateral (Bilateral, 2016); Cystocele repair (11/10/2018); Cystocele repair (11/10/2018); and Ganglion cyst excision (Left, 09/29/2021). Family History:  Herfamily history includes Atrial fibrillation in her sister; Colon cancer in her maternal uncle; Colon cancer (age of onset: 54 in her father; Heart disease in her maternal grandfather and mother; Heart failure (age of onset: 617 in her maternal grandfather; Kidney Stones in her son. Social History:  She reports that she has never smoked. She has never used smokeless tobacco. She reports that she does not drink alcohol and does not use drugs.   Review of Systems: Review of Systems  Constitutional:  Negative for malaise/fatigue and weight loss.  HENT:  Negative for hearing loss and tinnitus.   Eyes:  Negative for blurred vision and double vision.  Respiratory:  Negative for cough, shortness of breath and wheezing.   Cardiovascular:  Negative for chest pain, palpitations, orthopnea, claudication and leg  swelling.  Gastrointestinal:  Negative for abdominal pain, blood in stool, constipation, diarrhea, heartburn, melena, nausea and vomiting.  Genitourinary:  Positive for frequency (chronic). Negative for dysuria, flank pain, hematuria and urgency.  Musculoskeletal:  Negative for back pain (thoracic, improved), joint pain and myalgias.  Skin:  Negative for rash.  Neurological:  Negative for dizziness, tingling, sensory change, weakness and headaches.  Endo/Heme/Allergies:  Negative for polydipsia.  Psychiatric/Behavioral: Negative.  Negative for depression and substance abuse. The patient is not nervous/anxious.   All other systems reviewed and are negative.   Physical Exam: Estimated body mass index is 28.42 kg/m as calculated from the following:   Height as of this encounter: 5' 3.5" (1.613 m).   Weight as of this encounter: 163 lb (73.9 kg). BP 108/70   Pulse 93   Temp (!) 97.3 F (36.3 C)   Ht 5' 3.5" (1.613 m)   Wt 163 lb (73.9 kg)   SpO2 97%   BMI 28.42 kg/m  General Appearance: Well nourished, in no apparent distress.  Eyes: PERRLA, EOMs, conjunctiva no swelling or erythema; fundal exam deferred to ophth Sinuses: No Frontal/maxillary tenderness  ENT/Mouth: Ext aud canals clear, normal light reflex with TMs without erythema, bulging. Good dentition. No erythema, swelling, or exudate on post pharynx. Tonsils not swollen or erythematous. Hearing normal.  Neck: Supple, thyroid normal. No bruits  Respiratory: Respiratory effort normal, BS equal bilaterally without rales, rhonchi, wheezing or stridor.  Cardio: RRR without murmurs, rubs or gallops. No carotid or abdominal aortic bruit. Brisk peripheral pulses without edema.  Chest: symmetric, with normal excursions. Breasts: Patient declines, recent mammogram Abdomen: Soft, nontender, no guarding, rebound, hernias, masses, or organomegaly.  Lymphatics: Non tender without lymphadenopathy.  Genitourinary: Deferred Musculoskeletal:  Full ROM all peripheral extremities,5/5 strength, and normal gait. Mild/mod kyphosis, irregular but non-tender spinous processes. Let wrist with well healed surgical scar.  Skin: Warm, dry without lesions, ecchymosis. She has mild petichal rash annularly around R ankle. Very small plaque to L shin.  Neuro: Cranial nerves intact, reflexes  equal bilaterally. Normal muscle tone, no cerebellar symptoms. Sensation intact.  Psych: Awake and oriented X 3, normal affect, Insight and Judgment appropriate.    Darrol Jump, NP 9:31 AM [facility]

## 2022-08-11 NOTE — Patient Instructions (Signed)

## 2022-08-12 ENCOUNTER — Encounter: Payer: Medicare HMO | Admitting: Nurse Practitioner

## 2022-08-12 LAB — URINALYSIS, ROUTINE W REFLEX MICROSCOPIC
Bacteria, UA: NONE SEEN /HPF
Bilirubin Urine: NEGATIVE
Glucose, UA: NEGATIVE
Hgb urine dipstick: NEGATIVE
Ketones, ur: NEGATIVE
Nitrite: NEGATIVE
Protein, ur: NEGATIVE
Specific Gravity, Urine: 1.009 (ref 1.001–1.035)
pH: 7 (ref 5.0–8.0)

## 2022-08-12 LAB — MICROALBUMIN / CREATININE URINE RATIO
Creatinine, Urine: 72 mg/dL (ref 20–275)
Microalb Creat Ratio: 26 mcg/mg creat (ref <30)
Microalb, Ur: 1.9 mg/dL

## 2022-08-12 LAB — COMPLETE METABOLIC PANEL WITH GFR
AG Ratio: 1.4 (calc) (ref 1.0–2.5)
ALT: 10 U/L (ref 6–29)
AST: 16 U/L (ref 10–35)
Albumin: 4.2 g/dL (ref 3.6–5.1)
Alkaline phosphatase (APISO): 60 U/L (ref 37–153)
BUN/Creatinine Ratio: 19 (calc) (ref 6–22)
BUN: 11 mg/dL (ref 7–25)
CO2: 30 mmol/L (ref 20–32)
Calcium: 9.4 mg/dL (ref 8.6–10.4)
Chloride: 99 mmol/L (ref 98–110)
Creat: 0.59 mg/dL — ABNORMAL LOW (ref 0.60–1.00)
Globulin: 3 g/dL (calc) (ref 1.9–3.7)
Glucose, Bld: 90 mg/dL (ref 65–99)
Potassium: 4 mmol/L (ref 3.5–5.3)
Sodium: 140 mmol/L (ref 135–146)
Total Bilirubin: 0.6 mg/dL (ref 0.2–1.2)
Total Protein: 7.2 g/dL (ref 6.1–8.1)
eGFR: 97 mL/min/{1.73_m2} (ref >=60)

## 2022-08-12 LAB — CBC WITH DIFFERENTIAL/PLATELET
Absolute Monocytes: 624 cells/uL (ref 200–950)
Basophils Absolute: 33 cells/uL (ref 0–200)
Basophils Relative: 0.5 %
Eosinophils Absolute: 72 cells/uL (ref 15–500)
Eosinophils Relative: 1.1 %
HCT: 48 % — ABNORMAL HIGH (ref 35.0–45.0)
Hemoglobin: 16.2 g/dL — ABNORMAL HIGH (ref 11.7–15.5)
Lymphs Abs: 1645 cells/uL (ref 850–3900)
MCH: 30.1 pg (ref 27.0–33.0)
MCHC: 33.8 g/dL (ref 32.0–36.0)
MCV: 89.2 fL (ref 80.0–100.0)
MPV: 10 fL (ref 7.5–12.5)
Monocytes Relative: 9.6 %
Neutro Abs: 4128 cells/uL (ref 1500–7800)
Neutrophils Relative %: 63.5 %
Platelets: 201 10*3/uL (ref 140–400)
RBC: 5.38 10*6/uL — ABNORMAL HIGH (ref 3.80–5.10)
RDW: 12.6 % (ref 11.0–15.0)
Total Lymphocyte: 25.3 %
WBC: 6.5 10*3/uL (ref 3.8–10.8)

## 2022-08-12 LAB — LIPID PANEL
Cholesterol: 141 mg/dL (ref <200)
HDL: 63 mg/dL (ref >=50)
LDL Cholesterol (Calc): 64 mg/dL (calc)
Non-HDL Cholesterol (Calc): 78 mg/dL (calc) (ref <130)
Total CHOL/HDL Ratio: 2.2 (calc) (ref <5.0)
Triglycerides: 59 mg/dL (ref <150)

## 2022-08-12 LAB — MAGNESIUM: Magnesium: 2.1 mg/dL (ref 1.5–2.5)

## 2022-08-12 LAB — HEMOGLOBIN A1C
Hgb A1c MFr Bld: 5.6 % of total Hgb (ref <5.7)
Mean Plasma Glucose: 114 mg/dL
eAG (mmol/L): 6.3 mmol/L

## 2022-08-12 LAB — TSH: TSH: 1.19 mIU/L (ref 0.40–4.50)

## 2022-08-12 LAB — VITAMIN B12: Vitamin B-12: 1516 pg/mL — ABNORMAL HIGH (ref 200–1100)

## 2022-08-12 LAB — VITAMIN D 25 HYDROXY (VIT D DEFICIENCY, FRACTURES): Vit D, 25-Hydroxy: 74 ng/mL (ref 30–100)

## 2022-08-12 LAB — INSULIN, RANDOM: Insulin: 7.9 u[IU]/mL

## 2022-08-12 LAB — MICROSCOPIC MESSAGE

## 2022-08-16 ENCOUNTER — Other Ambulatory Visit: Payer: Medicare HMO

## 2022-08-16 ENCOUNTER — Ambulatory Visit
Admission: RE | Admit: 2022-08-16 | Discharge: 2022-08-16 | Disposition: A | Discharge status: Home / Self Care | Payer: Medicare HMO | Source: Ambulatory Visit | Attending: Nurse Practitioner | Admitting: Nurse Practitioner

## 2022-08-16 ENCOUNTER — Encounter: Payer: Self-pay | Admitting: Nurse Practitioner

## 2022-08-16 DIAGNOSIS — H43813 Vitreous degeneration, bilateral: Secondary | ICD-10-CM | POA: Diagnosis not present

## 2022-08-16 DIAGNOSIS — H35033 Hypertensive retinopathy, bilateral: Secondary | ICD-10-CM | POA: Diagnosis not present

## 2022-08-16 DIAGNOSIS — M8589 Other specified disorders of bone density and structure, multiple sites: Secondary | ICD-10-CM

## 2022-08-16 DIAGNOSIS — H353122 Nonexudative age-related macular degeneration, left eye, intermediate dry stage: Secondary | ICD-10-CM | POA: Diagnosis not present

## 2022-08-16 DIAGNOSIS — H353211 Exudative age-related macular degeneration, right eye, with active choroidal neovascularization: Secondary | ICD-10-CM | POA: Diagnosis not present

## 2022-08-16 DIAGNOSIS — Z780 Asymptomatic menopausal state: Secondary | ICD-10-CM | POA: Diagnosis not present

## 2022-08-18 ENCOUNTER — Encounter: Payer: Medicare HMO | Admitting: Nurse Practitioner

## 2022-08-25 ENCOUNTER — Other Ambulatory Visit (HOSPITAL_BASED_OUTPATIENT_CLINIC_OR_DEPARTMENT_OTHER): Payer: Self-pay

## 2022-08-25 MED ORDER — COMIRNATY 30 MCG/0.3ML IM SUSY
PREFILLED_SYRINGE | INTRAMUSCULAR | 0 refills | Status: DC
  Filled 2022-08-25: qty 0.3, 1d supply, fill #0

## 2022-09-20 ENCOUNTER — Other Ambulatory Visit: Payer: Self-pay

## 2022-09-20 ENCOUNTER — Emergency Department (HOSPITAL_BASED_OUTPATIENT_CLINIC_OR_DEPARTMENT_OTHER)
Admission: EM | Admit: 2022-09-20 | Discharge: 2022-09-20 | Disposition: A | Discharge status: Home / Self Care | Payer: Medicare HMO | Attending: Emergency Medicine | Admitting: Emergency Medicine

## 2022-09-20 ENCOUNTER — Encounter (HOSPITAL_BASED_OUTPATIENT_CLINIC_OR_DEPARTMENT_OTHER): Payer: Self-pay

## 2022-09-20 ENCOUNTER — Emergency Department (HOSPITAL_BASED_OUTPATIENT_CLINIC_OR_DEPARTMENT_OTHER): Payer: Medicare HMO | Admitting: Radiology

## 2022-09-20 ENCOUNTER — Emergency Department (HOSPITAL_BASED_OUTPATIENT_CLINIC_OR_DEPARTMENT_OTHER): Payer: Medicare HMO

## 2022-09-20 DIAGNOSIS — Z7982 Long term (current) use of aspirin: Secondary | ICD-10-CM | POA: Diagnosis not present

## 2022-09-20 DIAGNOSIS — W19XXXA Unspecified fall, initial encounter: Secondary | ICD-10-CM | POA: Insufficient documentation

## 2022-09-20 DIAGNOSIS — Y93H1 Activity, digging, shoveling and raking: Secondary | ICD-10-CM | POA: Diagnosis not present

## 2022-09-20 DIAGNOSIS — Z91040 Latex allergy status: Secondary | ICD-10-CM | POA: Insufficient documentation

## 2022-09-20 DIAGNOSIS — S32592A Other specified fracture of left pubis, initial encounter for closed fracture: Secondary | ICD-10-CM

## 2022-09-20 DIAGNOSIS — S32512A Fracture of superior rim of left pubis, initial encounter for closed fracture: Secondary | ICD-10-CM | POA: Diagnosis not present

## 2022-09-20 DIAGNOSIS — M25552 Pain in left hip: Secondary | ICD-10-CM | POA: Diagnosis not present

## 2022-09-20 DIAGNOSIS — S3993XA Unspecified injury of pelvis, initial encounter: Secondary | ICD-10-CM | POA: Diagnosis not present

## 2022-09-20 DIAGNOSIS — Z79899 Other long term (current) drug therapy: Secondary | ICD-10-CM | POA: Diagnosis not present

## 2022-09-20 DIAGNOSIS — Z043 Encounter for examination and observation following other accident: Secondary | ICD-10-CM | POA: Diagnosis not present

## 2022-09-20 DIAGNOSIS — M25512 Pain in left shoulder: Secondary | ICD-10-CM | POA: Diagnosis not present

## 2022-09-20 DIAGNOSIS — I10 Essential (primary) hypertension: Secondary | ICD-10-CM | POA: Insufficient documentation

## 2022-09-20 DIAGNOSIS — S32502A Unspecified fracture of left pubis, initial encounter for closed fracture: Secondary | ICD-10-CM | POA: Diagnosis not present

## 2022-09-20 MED ORDER — ACETAMINOPHEN 325 MG PO TABS
650.0000 mg | ORAL_TABLET | Freq: Once | ORAL | Status: AC
  Administered 2022-09-20: 650 mg via ORAL
  Filled 2022-09-20: qty 2

## 2022-09-20 NOTE — Discharge Instructions (Addendum)
Note that your work-up today was overall consistent with fracture of your hip as we discussed.  This is a fracture that can be managed at home.  Use walker provided as needed to help aid in ambulation.  Take Tylenol as needed for pain.  Call the number attached to your discharge papers to set up appointment with orthopedic doctor as we discussed; he is expecting you for repeat evaluation.  Please do not hesitate to return to emergency department if the worrisome signs and symptoms we discussed become apparent.

## 2022-09-20 NOTE — ED Triage Notes (Signed)
Pt here from home stating she had a mechanical fall while raking leaves earlier today.  She fell on her left hip.and shoulder.  She has left hip and groin pain and says it hurts to lift her left leg.  She takes a baby Aspirin every day but denies hitting her head.

## 2022-09-20 NOTE — ED Provider Notes (Signed)
[facility] EMERGENCY DEPT Provider Note   CSN: [id number] Arrival date & time: 09/20/22  1547     History  Chief Complaint  Patient presents with   Lytle Michaels    Vanessa French is a 71 y.o. female.   Fall   71 year old female presents emergency department after a fall.  Patient states that she was raking her leaves and pulling a tarp with leaves on it when she lost her balance and fell landing on her left hip and left arm.  She is currently complaining of left shoulder pain as well as left hip pain.  Denies trauma to head, loss of consciousness or blood thinner use.  She does state that she takes aspirin every day.  Denies visual disturbance, gait abnormalities, weakness/sensory deficits in upper or lower extremities, slurred speech, facial droop.  She states she has been able to ambulate independently since the incident.  Denies chest pain, shortness of breath, abdominal pain, neck/back pain.  She is taking no medication for this.  Past medical history significant for hypertension, pulmonary embolism, proximal humerus fracture  Home Medications Prior to Admission medications   Medication Sig Start Date End Date Taking? Authorizing Provider  Aflibercept (EYLEA) 2 MG/0.05ML SOSY by Intravitreal route every 30 (thirty) days.    [provider]  alendronate (FOSAMAX) 70 MG tablet TAKE 1 TABLET BY MOUTH ONCE WEEKLY ON AN EMPTY STOMACH BEFORE BREAKFAST. REMAIN UPRIGHT FOR 30 MINUTES & TAKE WITH 8 OUNCES OF WATER Patient not taking: Reported on 05/18/2022 07/29/21   Alycia Rossetti, NP  Ascorbic Acid (VITAMIN C/ROSE HIPS PO) Take 500 mg by mouth daily.    [provider]  calcipotriene (DOVONOX) 0.005 % cream Apply 1 application topically at bedtime.    [provider]  calcium carbonate (TUMS EX) 750 MG chewable tablet Chew 1 tablet by mouth daily.    [provider]  cetirizine (ZYRTEC) 10 MG tablet Take 10 mg by mouth as needed for  allergies.    [provider]  Cholecalciferol (VITAMIN D-3) 125 MCG (5000 UT) TABS Alternate taking 1 and 2 caps every other day. Patient taking differently: Take one tablet daily 04/25/21   [name], NP  CINNAMON PO Take 1,000 mg by mouth daily.    [provider]  COVID-19 mRNA vaccine [phone] (COMIRNATY) syringe Inject into the muscle. 08/25/22   Carlyle Basques, MD  CRANBERRY PO Take by mouth. Take 2 caplets BID    [provider]  desonide (DESOWEN) 0.05 % ointment Apply 1 Application topically as needed.    [provider]  DULoxetine (CYMBALTA) 60 MG capsule Take  1 capsule  Daily  for Mood & Chronic Back Pain 06/14/22   Unk Pinto, MD  famotidine (PEPCID) 20 MG tablet TAKE ONE TABLET BY MOUTH TWICE A DAY FOR INDIGESTION AND HEARTBURN 04/17/22   [name], NP  [place] (VABYSMO IZ) by Intravitreal route every 30 (thirty) days.    [provider]  hydrochlorothiazide (HYDRODIURIL) 25 MG tablet Take  1 tablet  Daily  as Needed for BP & Fluid Retention / Ankle Swelling                     /            TAKE                           BY  MOUTH Patient taking differently: Take 1/2-1 tablet daily 04/17/22   Unk Pinto, MD  Methylcobalamin (B12 SL) Place 1 tablet under the tongue See admin instructions. Take one tablet ('1000mg'$ ) daily    [provider]  Multiple Vitamin (MULTI-VITAMIN DAILY PO) Take 1 tablet by mouth daily.    [provider]  nitrofurantoin, macrocrystal-monohydrate, (MACROBID) 100 MG capsule Take 100 mg by mouth daily. UTI prophylaxis Patient not taking: Reported on 05/18/2022    [provider]  omeprazole (PRILOSEC) 20 MG capsule Take 20 mg by mouth at bedtime.    [provider]  pseudoephedrine (SUDAFED) 120 MG 12 hr tablet Take  1 tablet  2 x /day (every 12 hours)  for Sinus & Chest Congestion 09/20/21   Unk Pinto, MD  rosuvastatin  (CRESTOR) 5 MG tablet Take 1 tab daily for cholesterol. 02/17/22   [name], NP  solifenacin (VESICARE) 5 MG tablet Take 1 tablet (5 mg total) by mouth daily. 02/16/22 02/16/23  [name], NP  triamcinolone cream (KENALOG) 0.1 % Apply 1 application topically 2 (two) times daily. Patient taking differently: Apply 1 application  topically as needed. 02/21/19   [name], NP      Allergies    Penicillins and Latex    Review of Systems   Review of Systems  All other systems reviewed and are negative.   Physical Exam Updated Vital Signs BP 124/76 (BP Location: Left Arm)   Pulse 93   Temp 99.1 F (37.3 C)   Resp 18   Ht 5' 3.5" (1.613 m)   Wt 75.8 kg   SpO2 96%   BMI 29.12 kg/m  Physical Exam Vitals and nursing note reviewed.  Constitutional:      General: She is not in acute distress.    Appearance: She is well-developed.  HENT:     Head: Normocephalic and atraumatic.  Eyes:     Conjunctiva/sclera: Conjunctivae normal.  Cardiovascular:     Rate and Rhythm: Normal rate and regular rhythm.     Heart sounds: No murmur heard. Pulmonary:     Effort: Pulmonary effort is normal. No respiratory distress.     Breath sounds: Normal breath sounds.  Abdominal:     Palpations: Abdomen is soft.     Tenderness: There is no abdominal tenderness.  Musculoskeletal:        General: No swelling.     Cervical back: Neck supple.     Comments: No midline tenderness of cervical, thoracic, lumbar spine.  No chest wall tenderness.  No right upper or bilateral lower extremity tenderness to palpation.  Patient has no palpable tenderness of left upper extremity but pain is elicited with active range of motion in shoulder flexion, extension, AB/adduction.  No overlying skin abnormalities noted.  Patient complaining no sensory deficits along major nerve distributions of upper or lower extremities.  Muscle strength 5 out of 5 for upper and lower extremities.  Radial and pedal pulses full and  intact bilaterally.  Patient does have tenderness when palpating ASIS on the left side.  Skin:    General: Skin is warm and dry.     Capillary Refill: Capillary refill takes less than 2 seconds.  Neurological:     Mental Status: She is alert.  Psychiatric:        Mood and Affect: Mood normal.     ED Results / Procedures / Treatments   Labs (all labs ordered are listed, but only abnormal results are displayed) Labs Reviewed -  No data to display  EKG None  Radiology CT Shoulder Left Wo Contrast  Result Date: 09/20/2022 CLINICAL DATA:  Fall, left shoulder pain EXAM: CT OF THE UPPER LEFT EXTREMITY WITHOUT CONTRAST TECHNIQUE: Multidetector CT imaging of the upper left extremity was performed according to the standard protocol. RADIATION DOSE REDUCTION: This exam was performed according to the departmental dose-optimization program which includes automated exposure control, adjustment of the mA and/or kV according to patient size and/or use of iterative reconstruction technique. COMPARISON:  Same-day x-ray FINDINGS: Bones/Joint/Cartilage No acute fracture. There is a chronic healed fracture deformity of the left humeral neck. Glenohumeral joint alignment is maintained without dislocation. Mild degenerative changes of the glenohumeral and acromioclavicular joints. No appreciable joint effusion or evidence of hemarthrosis. Remaining included osseous structures appear intact without fracture. Ligaments Suboptimally assessed by CT. Muscles and Tendons No rotator cuff muscle atrophy. No definitive CT findings to suggest tendon injury. Soft tissues No soft tissue swelling or fluid collection. No left axillary lymphadenopathy. No acute findings within the included left lung field. Aortic and coronary artery atherosclerosis. IMPRESSION: 1. No acute fracture or dislocation of the left shoulder. 2. Chronic healed fracture deformity of the left humeral neck. 3. Mild degenerative changes of the glenohumeral and  acromioclavicular joints. 4. Aortic and coronary artery atherosclerosis (ICD10-I70.0). Electronically Signed   By: Davina Poke D.O.   On: 09/20/2022 21:02   CT PELVIS WO CONTRAST  Result Date: 09/20/2022 CLINICAL DATA:  Fall.  Abnormal pelvic x-ray EXAM: CT PELVIS WITHOUT CONTRAST TECHNIQUE: Multidetector CT imaging of the pelvis was performed following the standard protocol without intravenous contrast. RADIATION DOSE REDUCTION: This exam was performed according to the departmental dose-optimization program which includes automated exposure control, adjustment of the mA and/or kV according to patient size and/or use of iterative reconstruction technique. COMPARISON:  Same-day pelvic x-ray FINDINGS: Urinary Tract:  No abnormality visualized. Bowel: Sigmoid diverticulosis. No evidence of bowel obstruction or active bowel inflammation within the pelvis. Vascular/Lymphatic: Scattered atherosclerotic calcifications. No intrapelvic or inguinal lymphadenopathy. Reproductive:  Prior hysterectomy.  No adnexal masses. Other: Intrapelvic hematoma adjacent to the left superior pubic ramus fracture with adjacent more ill-defined intrapelvic hemorrhage, in total measuring approximately 8.6 x 2.1 x 5.7 cm. Musculoskeletal: Acute comminuted fracture of the left superior pubic ramus with mild displacement. Minimally displaced fracture of the mid left inferior pubic ramus. Asymmetric enlargement of the left obturator externus muscle, likely a component of intramuscular hemorrhage. Pubic symphysis intact without diastasis. Bilateral sacroiliac joints are intact without diastasis. Nondisplaced fracture of the sacrum at the S4 level appears chronic (series 7, image 116). No additional pelvic fractures are seen. Bilateral hip joints are intact without fracture or dislocation. IMPRESSION: 1. Acute mildly displaced fractures of the left superior and inferior pubic rami. 2. Intrapelvic hematoma adjacent to the left superior pubic  ramus fracture with adjacent more ill-defined intrapelvic hemorrhage, in total measuring approximately 8.6 x 2.1 x 5.7 cm. 3. Asymmetric enlargement of the left obturator externus muscle, likely a component of intramuscular hemorrhage. 4. Nondisplaced fracture of the sacrum at the S4 level appears chronic. Correlate with point tenderness. Electronically Signed   By: Davina Poke D.O.   On: 09/20/2022 20:57   DG Humerus Left  Result Date: 09/20/2022 CLINICAL DATA:  Fall EXAM: LEFT HUMERUS - 2+ VIEW; LEFT SHOULDER - 2+ VIEW COMPARISON:  None available. FINDINGS: Irregular lucency within the superior portion of the scaphoid extending into the glenoid fossa concerning for scaphoid fracture versus projection artifact. No definite  fracture of the humerus. Irregularity about the surgical neck of the humerus is presumed chronic/degenerative. No dislocation. IMPRESSION: Question minimally displaced scaphoid fracture versus projection artifact. Consider CT for further evaluation. Electronically Signed   By: Placido Sou M.D.   On: 09/20/2022 18:45   DG Shoulder Left  Result Date: 09/20/2022 CLINICAL DATA:  Fall EXAM: LEFT HUMERUS - 2+ VIEW; LEFT SHOULDER - 2+ VIEW COMPARISON:  None available. FINDINGS: Irregular lucency within the superior portion of the scaphoid extending into the glenoid fossa concerning for scaphoid fracture versus projection artifact. No definite fracture of the humerus. Irregularity about the surgical neck of the humerus is presumed chronic/degenerative. No dislocation. IMPRESSION: Question minimally displaced scaphoid fracture versus projection artifact. Consider CT for further evaluation. Electronically Signed   By: Placido Sou M.D.   On: 09/20/2022 18:45   DG Pelvis 1-2 Views  Result Date: 09/20/2022 CLINICAL DATA:  fall EXAM: PELVIS - 1-2 VIEW COMPARISON:  X-ray sacrum 07/02/2008 FINDINGS: Frontal view of bilateral hips unremarkable. Vague vertical lucency along the superior  left pubic rami may be due to overlying soft tissues. There is no evidence of pelvic fracture or diastasis. No pelvic bone lesions are seen. IMPRESSION: Vague vertical lucency along the superior left pubic rami likely due to overlying soft tissues. Consider couple other radiographic views of the pelvis for further evaluation. Otherwise negative for definite acute traumatic injury. Electronically Signed   By: Iven Finn M.D.   On: 09/20/2022 18:45    Procedures Procedures    Medications Ordered in ED Medications  acetaminophen (TYLENOL) tablet 650 mg (650 mg Oral Given 09/20/22 2044)    ED Course/ Medical Decision Making/ A&P Clinical Course as of 09/20/22 2222  Mon Sep 20, 2022  2156 Discussed care with orthopedic surgeon Dr. Lynann Bologna regarding the patient.  He agreed with outpatient management of the patient's fractures given that she is able to ambulate.  Walker provided to help aid in ambulation. [CR]    Clinical Course User Index [CR] Wilnette Kales, [place]                           Medical Decision Making Amount and/or Complexity of Data Reviewed Radiology: ordered.  Risk OTC drugs.   This patient presents to the ED for concern of fall, this involves an extensive number of treatment options, and is a complaint that carries with it a high risk of complications and morbidity.  The differential diagnosis includes CVA, fracture, strain chest pain, dislocation, solid organ damage   Co morbidities that complicate the patient evaluation  See HPI   Additional history obtained:  Additional history obtained from EMR External records from outside source obtained and reviewed including hospital records   Lab Tests:  N/a  Imaging Studies ordered:  I ordered imaging studies including left shoulder, left humerus, pelvis x-ray.  CT left shoulder and pelvis. I independently visualized and interpreted imaging which showed  Left shoulder x-ray/left humerus: Minimally displaced  scapular fracture versus projectional artifact Pelvis x-ray: Negligible lucency along superior left pubic ramus.  Otherwise negative for definitive acute traumatic injury. CT left shoulder: No acute fracture dislocation left shoulder.  Chronic healed fracture deformity of left humeral neck.  Degenerative changes of the glenohumeral and acromioclavicular joints.  Aortic and coronary artery atherosclerosis. CT pelvis: Acute mildly displaced fractures of left superior and inferior pubic ramus.  Intrapelvic hematoma adjacent to left superior pubic ramus fracture measuring approximately 8.6 x 2.1 x  5.7 cm.  Asymmetric enlarged of left obturator externus muscle.  Nondisplaced fracture of sacrum at S4 level. I agree with the radiologist interpretation  Cardiac Monitoring: / EKG:  The patient was maintained on a cardiac monitor.  I personally viewed and interpreted the cardiac monitored which showed an underlying rhythm of: Sinus rhythm   Consultations Obtained:  See ED course  Problem List / ED Course / Critical interventions / Medication management  Fall/pelvic ramus fracture I ordered medication including Tylenol for pain   Reevaluation of the patient after these medicines showed that the patient improved I have reviewed the patients home medicines and have made adjustments as needed   Social Determinants of Health:  Denies tobacco, illicit drug use.   Test / Admission - Considered:  Fall/pelvic ramus fracture Vitals signs within normal range and stable throughout visit. Laboratory/imaging studies significant for: See above Patient symptoms likely secondary to findings as indicated above.  Patient able to tolerate weightbearing but with pain on that left side.  Patient given walker to help aid in ambulation as needed.  Patient educated to take Tylenol as needed for pain.  She was recommended to avoid NSAIDs given presence of intrapelvic hematoma.  No evidence of extensive blood loss given  size of hematoma as well as stable vital signs.  Patient admitted follow-up with orthopedics outpatient for reevaluation of symptoms.  Treatment plan discussed length with patient she acknowledged understand was agreeable to said plan. Worrisome signs and symptoms were discussed with the patient, and the patient acknowledged understanding to return to the ED if noticed. Patient was stable upon discharge. '        Final Clinical Impression(s) / ED Diagnoses Final diagnoses:  Closed fracture of ramus of left pubis, initial encounter [facility])  Fall, initial encounter    Rx / DC Orders ED Discharge Orders     None         Wilnette Kales, [place] 09/20/22 2222    Elgie Congo, MD 09/21/22 1236

## 2022-09-22 DIAGNOSIS — E755 Other lipid storage disorders: Secondary | ICD-10-CM | POA: Diagnosis not present

## 2022-09-23 DIAGNOSIS — H353211 Exudative age-related macular degeneration, right eye, with active choroidal neovascularization: Secondary | ICD-10-CM | POA: Diagnosis not present

## 2022-09-29 DIAGNOSIS — S32810A Multiple fractures of pelvis with stable disruption of pelvic ring, initial encounter for closed fracture: Secondary | ICD-10-CM | POA: Diagnosis not present

## 2022-10-28 DIAGNOSIS — H353211 Exudative age-related macular degeneration, right eye, with active choroidal neovascularization: Secondary | ICD-10-CM | POA: Diagnosis not present

## 2022-11-11 ENCOUNTER — Other Ambulatory Visit: Payer: Self-pay | Admitting: Internal Medicine

## 2022-11-11 MED ORDER — ALENDRONATE SODIUM 70 MG PO TABS: ORAL_TABLET | ORAL | 3 refills | Status: DC

## 2022-11-11 MED ORDER — FAMOTIDINE 40 MG PO TABS: ORAL_TABLET | ORAL | 3 refills | Status: DC

## 2022-11-12 DIAGNOSIS — S32810D Multiple fractures of pelvis with stable disruption of pelvic ring, subsequent encounter for fracture with routine healing: Secondary | ICD-10-CM | POA: Diagnosis not present

## 2022-11-18 ENCOUNTER — Other Ambulatory Visit: Payer: Self-pay | Admitting: Internal Medicine

## 2022-11-18 DIAGNOSIS — Z1231 Encounter for screening mammogram for malignant neoplasm of breast: Secondary | ICD-10-CM

## 2022-11-30 ENCOUNTER — Ambulatory Visit
Admission: RE | Admit: 2022-11-30 | Discharge: 2022-11-30 | Disposition: A | Discharge status: Home / Self Care | Payer: Medicare HMO | Source: Ambulatory Visit | Attending: Internal Medicine | Admitting: Internal Medicine

## 2022-11-30 DIAGNOSIS — Z1231 Encounter for screening mammogram for malignant neoplasm of breast: Secondary | ICD-10-CM | POA: Diagnosis not present

## 2022-12-02 DIAGNOSIS — H43813 Vitreous degeneration, bilateral: Secondary | ICD-10-CM | POA: Diagnosis not present

## 2022-12-02 DIAGNOSIS — H353122 Nonexudative age-related macular degeneration, left eye, intermediate dry stage: Secondary | ICD-10-CM | POA: Diagnosis not present

## 2022-12-02 DIAGNOSIS — H35033 Hypertensive retinopathy, bilateral: Secondary | ICD-10-CM | POA: Diagnosis not present

## 2022-12-02 DIAGNOSIS — H353211 Exudative age-related macular degeneration, right eye, with active choroidal neovascularization: Secondary | ICD-10-CM | POA: Diagnosis not present

## 2022-12-03 DIAGNOSIS — L578 Other skin changes due to chronic exposure to nonionizing radiation: Secondary | ICD-10-CM | POA: Diagnosis not present

## 2022-12-03 DIAGNOSIS — L719 Rosacea, unspecified: Secondary | ICD-10-CM | POA: Diagnosis not present

## 2022-12-03 DIAGNOSIS — D235 Other benign neoplasm of skin of trunk: Secondary | ICD-10-CM | POA: Diagnosis not present

## 2022-12-03 DIAGNOSIS — L820 Inflamed seborrheic keratosis: Secondary | ICD-10-CM | POA: Diagnosis not present

## 2022-12-03 DIAGNOSIS — L409 Psoriasis, unspecified: Secondary | ICD-10-CM | POA: Diagnosis not present

## 2022-12-11 ENCOUNTER — Other Ambulatory Visit: Payer: Self-pay | Admitting: Internal Medicine

## 2022-12-11 MED ORDER — OMEPRAZOLE 40 MG PO CPDR: DELAYED_RELEASE_CAPSULE | ORAL | 3 refills | Status: DC

## 2022-12-11 MED ORDER — AZITHROMYCIN 250 MG PO TABS: ORAL_TABLET | ORAL | 1 refills | Status: DC

## 2023-01-13 DIAGNOSIS — H353231 Exudative age-related macular degeneration, bilateral, with active choroidal neovascularization: Secondary | ICD-10-CM | POA: Diagnosis not present

## 2023-01-24 ENCOUNTER — Ambulatory Visit (INDEPENDENT_AMBULATORY_CARE_PROVIDER_SITE_OTHER): Payer: Medicare HMO | Admitting: Internal Medicine

## 2023-01-24 ENCOUNTER — Encounter: Payer: Self-pay | Admitting: Internal Medicine

## 2023-01-24 VITALS — BP 108/80 | HR 101 | Temp 97.5°F | Ht 63.5 in | Wt 166.0 lb

## 2023-01-24 DIAGNOSIS — R079 Chest pain, unspecified: Secondary | ICD-10-CM

## 2023-01-24 DIAGNOSIS — E559 Vitamin D deficiency, unspecified: Secondary | ICD-10-CM | POA: Diagnosis not present

## 2023-01-24 DIAGNOSIS — M26629 Arthralgia of temporomandibular joint, unspecified side: Secondary | ICD-10-CM

## 2023-01-24 DIAGNOSIS — E538 Deficiency of other specified B group vitamins: Secondary | ICD-10-CM

## 2023-01-24 MED ORDER — VITAMIN D-3 125 MCG (5000 UT) PO TABS: ORAL_TABLET | ORAL | 0 refills | Status: AC

## 2023-01-24 MED ORDER — VITAMIN B-12 1000 MCG SL SUBL: SUBLINGUAL_TABLET | SUBLINGUAL | 0 refills | Status: DC

## 2023-01-24 MED ORDER — ASPIRIN 81 MG PO TBEC: DELAYED_RELEASE_TABLET | ORAL | 0 refills | Status: AC

## 2023-01-24 NOTE — Progress Notes (Signed)
Future Appointments  Date Time Provider Department  02/17/2023 11:00 AM Alycia Rossetti, NP [facility]    History of Present Illness:      The patient is a very nice 72 y.o. MWF  with HTN, HLD, Pre-Diabetes and Vitamin D Deficiency who presents with hx/ of Covid respiratory infection about a week ago with sx's  of a bronchitis near resolving now. Earlier this am , she experienced a sharp pain about her L mastoid area which later became more of an "ache". Then later in the morning , she experience a mild dull ache of her lower sternal  / EG area. . She is on Omeprazole for HB .   Current Outpatient Medications  Medication Instructions   alendronate (FOSAMAX) 70 MG tablet Take 1 tablet very 7 days   calcipotriene (DOVONOX) 123XX123 % cream 1 application , Daily at bedtime   TUMS EX   750 MG chewable tablet 1 tablet, Daily   cetirizine (ZYRTEC) 10 mg, Oral, As needed   VITAMIN D   5000 u Take one tablet daily   CRANBERRY PO Oral, Take 2 caplets BID    desonide (DESOWEN) 0.05 % ointment 1 Application, Topical, As needed   DULoxetine (CYMBALTA) 60 MG capsule Take  1 capsule  Daily  for Mood & Chronic Back Pain   Faricimab-svoa (VABYSMO IZ) Intravitreal, Every 30 days   Hydrochlorothiazide 25 MG tablet Take  1 tablet  Daily  as Needed   Methylcobalamin (B12 SL) 1 tablet, See admin instructions   omeprazole (PRILOSEC) 40 MG capsule Take 1 capsule Daily for Indigestion & Heartburn   Pseudoephedrine 120 MG 12 hr tablet Take  1 tablet  2 x /day (every 12 hours)  for Sinus   rosuvastatin (CRESTOR) 5 MG tablet Take 1 tab daily for cholesterol.   solifenacin (VESICARE) 5 mg, Oral, Daily   triamcinolone cream (KENALOG) 0.1 % 1 application , Topical, 2 times daily     Allergies  Allergen Reactions   Penicillins Rash   Latex Rash     Problem list She has Anxiety; OAB (overactive bladder); History of prediabetes; Medication management; Osteopenia of multiple sites; BMI 28.0-28.9,adult;  Hyperlipidemia; Vitamin D deficiency; Psoriasis; GERD (gastroesophageal reflux disease); Essential hypertension; Thoracic compression fracture; B12 deficiency; Aortic atherosclerosis ([place])- CT 04/2021; and Macular degeneration of both eyes on their problem list.   Observations/Objective:  BP 108/80   Pulse (!) 101   Temp (!) 97.5 F (36.4 C)   Ht 5' 3.5" (1.613 m)   Wt 166 lb (75.3 kg)   SpO2 99%   BMI 28.94 kg/m   HEENT - WNL. Except sl tender Lt >Rt TM jts.  Neck - supple.  Chest - Clear equal BS. Cor - Nl HS. RRR w/o sig MGR. PP 1(+). No edema. MS- FROM w/o deformities.  Gait Nl. Neuro -  Nl w/o focal abnormalities.  EKG - > NSR  & WNL.  Assessment and Plan:   1. Chest pain, unspecified type - doubt cardiac   2. Arthralgia of temporomandibular joint    Follow Up Instructions:        I discussed the assessment and treatment plan with the patient. The patient was provided an opportunity to ask questions and all were answered. The patient agreed with the plan and demonstrated an understanding of the instructions.       The patient was advised to call back or seek an in-person evaluation if the symptoms worsen or if the condition  fails to improve as anticipated.   Kirtland Bouchard, MD

## 2023-01-25 NOTE — Addendum Note (Signed)
Addended by: Unk Pinto on: 01/25/2023 03:22 PM   Modules accepted: Orders

## 2023-01-29 ENCOUNTER — Other Ambulatory Visit: Payer: Self-pay | Admitting: Internal Medicine

## 2023-01-29 DIAGNOSIS — E782 Mixed hyperlipidemia: Secondary | ICD-10-CM

## 2023-01-29 MED ORDER — ROSUVASTATIN CALCIUM 5 MG PO TABS: ORAL_TABLET | ORAL | 3 refills | Status: DC

## 2023-01-31 DIAGNOSIS — H353231 Exudative age-related macular degeneration, bilateral, with active choroidal neovascularization: Secondary | ICD-10-CM | POA: Diagnosis not present

## 2023-02-16 NOTE — Progress Notes (Unsigned)
ANNUAL WELLNESS VISIT AND FOLLOW UP   Assessment and Plan:  Annual Medicare Wellness Visit Due annually  Health maintenance reviewed  Atherosclerosis of aorta (HCC) per CT 04/2021 Control blood pressure, cholesterol, glucose, increase exercise.   Other acute pulmonary embolism without acute cor pulmonale (HCC) Sx resolved; very small PE in provoked setting Completed xarelto x 3 months and tapered  Now on ASA  Resolved to hx  Pelvic fracture of left Continue on Fosamax  70 mg once a week Dexa UTD 07/2022  Hypertension Continue medication Monitor blood pressure at home; call if consistently over 130/80 Continue DASH diet.   Reminder to go to the ER if any CP, SOB, nausea, dizziness, severe HA, changes vision/speech, left arm numbness and tingling and jaw pain.  OAB (overactive bladder) Improved s/p repair of bladder prolapse.  She benefits from vesicare 5 mg daily  Wears 1-2 liners/day  Anxiety Well managed by current regimen; cymbalta 60 mg (pain with taper) Stress management techniques discussed, increase water, good sleep hygiene discussed, increase exercise, and increase veggies.   Abnormal Glucose Discussed disease and risks of elevated glucose Discussed diet/exercise, weight management  Check A1C annually; monitor serum glucose    Overweight- BMI 28  Commended her on her excellent progress with 50 lb weight loss in last few years  She is maintaining well  Recommended diet heavy in fruits and veggies and low in animal meats, cheeses, and dairy products, appropriate calorie intake Exercise encouraged; encouraged resistance exercises Follow up at next visit  Hyperipidemia Mild elevations have been working on lifestyle but trending up Based on updated results 10 year ASCVD risk was 9.4%, recommended to start statin; rosuvastatin 5 mg daily sent and 3 month follow up recommended LDL goal <100 Continue low cholesterol diet and exercise.  Check lipid panel  Vitamin  D deficiency Continue Vit D supplementation to maintain value in therapeutic level of 60-100   Medication management -     CBC with Differential/Platelet -     CMP/GFR -     magnesium  Osteopenia of multiple sites DEXA UTD 07/2022 continue vit D and calcium supplements Back on fosamax since last check due to fracture per Dr. [name] Previously on 18 months on 6 months off schedule per ortho recommendation  Psoriasis Previously treated by methotrexate but has been tapered off by derm Dr. Elmon Else; doing well with topical steroids  GERD Controlled sx; continue meds Discussed diet, avoiding triggers and other lifestyle changes  Thoracic compression fracture (HCC) Dr. [name] following, kyphoplasty not recommended Chronic; has been 6+ months  Macular degeneration Opth following, injections on R q 4-6 weeks, left eye will now start injections q 4-6 weeks with Dr Lita Mains No visual barriers to ADLs  B12 deficiency - continue supplement  Ganglion cyst, L wrist S/p resection, resolved.   Left ulnar radicular sx Dr. Merlyn Lot following   Microalbuminuria - recheck today to see if isolated vs persistent  Abnormal urine - UA routine with reflex microscopic - Urine culture  Left leg laceration -TD given as out of date Monitor for S/S of infection   Orders Placed This Encounter  Procedures   CBC with Differential/Platelet   COMPLETE METABOLIC PANEL WITH GFR   Lipid panel   TSH   Magnesium    Discussed med's effects and SE's. Screening labs and tests as requested with regular follow-up as recommended. Over 40 minutes of exam, counseling, chart review, and complex, high level critical decision making was performed this visit.  Future Appointments  Date Time Provider Department Center  02/17/2024 11:00 AM Raynelle Dick, NP [facility] None    Plan:   During the course of the visit the patient was educated and counseled about appropriate screening and preventive  services including:   Pneumococcal vaccine  Prevnar 13 Influenza vaccine Td vaccine Screening electrocardiogram Bone densitometry screening Colorectal cancer screening Diabetes screening Glaucoma screening Nutrition counseling  Advanced directives: requested    HPI  BP 114/68   Pulse 88   Temp 97.9 F (36.6 C)   Ht 5' 3.5" (1.613 m)   Wt 164 lb 8 oz (74.6 kg)   SpO2 97%   BMI 28.68 kg/m   72 y.o. female  presents for AWV and follow up for has Anxiety; OAB (overactive bladder); History of prediabetes; Medication management; Osteopenia of multiple sites; BMI 28.0-28.9,adult; Hyperlipidemia; Vitamin D deficiency; Psoriasis; GERD (gastroesophageal reflux disease); Essential hypertension; Thoracic compression fracture; B12 deficiency; Aortic atherosclerosis (HCC)- CT 04/2021; and Macular degeneration of both eyes on their problem list.   She is married, 3 sons, 2 grandchildren. 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  Husband has Parkinson's and somewhat progressing.   She had left wrist ganglion cyst, persistent despite resection attempt by Dr. Oneta Rack here, was referred to Dr. Merlyn Lot and underwent resection 09/29/2021 and has done well with then. However she was having persistent ulnar radicular sx, had NCV by Dr. Neldon Newport suggestive of ulnar entrapment, was placed on methylprednisolone taper with some improvement but not resolved per patient, has follow up in a few weeks.  Hx of provoked PE after humerus fracture without recurrence after tapering off of coumadin for many years until recently 04/2021 developed exertional dyspnea and tachycardia following recovery from covid 19, + d dimer on 05/13/2021, CTA showed small LUL PE on 05/14/2021, complexted xarelto 3 months and has done well since tapering off.   She is followed by Dr. Elmon Else for psoriasis previously on methotrexate, currently off and doing well with topical steroids PRN.   She is on vitamin D and calcium supplements. She did have  fall down stairs at home in Sept 2021, has been found to have thoracic compression fractures - T1 and T11 new, Old T3 and T12 fracture. Has seen Dr. [name] who did not recommend kyphoplasty. She has hx of osteoporosis, currently back on fosamax since fall 2021; taking intermittently since ?2013 since humerus fracture; most recent DEXA in 09/2020 demonstrated L fem T -2.0.   Has hx of vaginal hysterectomy and had  bladder sling in 2000; had frequent UTIs in the past with cystocele and was referred to Dr. Marcelyn Bruins with [facility] and underwent anterior cystocele repair per his recommendation.  Wears a pad PRN, vesicare 5 mg dose help.   She had UTI over Easter as well as Covid, is now doing well  She is on vesicare for urge incontinence  She is on Cymbalta 60 mg for mood/chronic pain benefit.   she has a diagnosis of GERD which is currently managed by famotidine 20 mg AM and omeprazole 20 mg PM daily.   BMI is Body mass index is 28.68 kg/m., she is working on diet and exercise. She successfully lost over 50 lb with phentermine and has maintained. She is walking regularly for exercise. She has lost 4 pounds in the past 4 weeks Wt Readings from Last 3 Encounters:  02/17/23 164 lb 8 oz (74.6 kg)  01/24/23 166 lb (75.3 kg)  09/20/22 167 lb (75.8 kg)   Her  blood pressure has been controlled at home, today their BP is BP: 114/68  BP Readings from Last 3 Encounters:  02/17/23 114/68  01/24/23 108/80  09/20/22 124/74  She does workout. She denies chest pain, shortness of breath, dizziness.   Recent CT 05/14/2021 showed aortic atherosclerosis.   Tetanus shot is overdue and has a small laceration on left foot, uncertain of cause.   She is not on cholesterol medication, Crestor 5 mg daily and denies myalgias. Her cholesterol is not at goal due to low risk history and mild elevations and patiet preference. No strong family hx of CVD. The cholesterol last visit was:   Lab Results  Component Value Date    CHOL 141 08/11/2022   HDL 63 08/11/2022   LDLCALC 64 08/11/2022   TRIG 59 08/11/2022   CHOLHDL 2.2 08/11/2022   She has been working on diet and exercise for hx of prediabetes, she is on bASA, she is not on ACE/ARB and denies foot ulcerations, increased appetite, nausea, paresthesia of the feet, polydipsia, polyuria, visual disturbances and vomiting. Last A1C in the office was:  Lab Results  Component Value Date   HGBA1C 5.6 08/11/2022   Last GFR: Lab Results  Component Value Date   EGFR 97 08/11/2022   Lab Results  Component Value Date   MICRALBCREAT 26 08/11/2022   MICRALBCREAT 4 02/16/2022   MICRALBCREAT 41 (H) 08/11/2021   Patient is on Vitamin D supplement, has reduced to alternating 5000 IU daily.  Lab Results  Component Value Date   VD25OH 74 08/11/2022     She is newly on B12 SL, 1000 mcg daily.  Lab Results  Component Value Date   VITAMINB12 1,516 (H) 08/11/2022     Current Medications:  Current Outpatient Medications on File Prior to Visit  Medication Sig Dispense Refill   aspirin EC 81 MG tablet Takes 1 tablet Daily 1 tablet 0   calcipotriene (DOVONOX) 0.005 % cream Apply 1 application topically at bedtime.     calcium carbonate (TUMS EX) 750 MG chewable tablet Chew 1 tablet by mouth daily.     cetirizine (ZYRTEC) 10 MG tablet Take 10 mg by mouth as needed for allergies.     Cholecalciferol (VITAMIN D-3) 125 MCG (5000 UT) TABS Take one tablet daily 30 tablet 0   CRANBERRY PO Take by mouth. Take 2 caplets BID     Cyanocobalamin (VITAMIN B-12) 1000 MCG SUBL Takes SL 3 x / week 1 tablet 0   desonide (DESOWEN) 0.05 % ointment Apply 1 Application topically as needed.     DULoxetine (CYMBALTA) 60 MG capsule Take  1 capsule  Daily  for Mood & Chronic Back Pain 90 capsule 3   Faricimab-svoa (VABYSMO IZ) by Intravitreal route every 30 (thirty) days.     hydrochlorothiazide (HYDRODIURIL) 25 MG tablet Take  1 tablet  Daily  as Needed for BP & Fluid Retention / Ankle  Swelling                     /            TAKE                           BY                         MOUTH (Patient taking differently: Take 1/2-1 tablet daily) 90 tablet 3   omeprazole (  PRILOSEC) 40 MG capsule Take 1 capsule Daily for Indigestion & Heartburn 90 capsule 3   rosuvastatin (CRESTOR) 5 MG tablet Take 1 tab daily for cholesterol. 90 tablet 3   triamcinolone cream (KENALOG) 0.1 % Apply 1 application topically 2 (two) times daily. (Patient taking differently: Apply 1 application  topically as needed.) 80 g 1   Methylcobalamin (B12 SL) Place 1 tablet under the tongue See admin instructions. Take one tablet (1000mg ) daily (Patient not taking: Reported on 01/24/2023)     pseudoephedrine (SUDAFED) 120 MG 12 hr tablet Take  1 tablet  2 x /day (every 12 hours)  for Sinus & Chest Congestion (Patient not taking: Reported on 02/17/2023) 60 tablet 3   solifenacin (VESICARE) 5 MG tablet 1 tablet Orally Once a day     No current facility-administered medications on file prior to visit.   Allergies:  Allergies  Allergen Reactions   Penicillins Rash   Latex Rash   Medical History:  She has Anxiety; OAB (overactive bladder); History of prediabetes; Medication management; Osteopenia of multiple sites; BMI 28.0-28.9,adult; Hyperlipidemia; Vitamin D deficiency; Psoriasis; GERD (gastroesophageal reflux disease); Essential hypertension; Thoracic compression fracture; B12 deficiency; Aortic atherosclerosis (HCC)- CT 04/2021; and Macular degeneration of both eyes on their problem list.  Health Maintenance:   Immunization History  Administered Date(s) Administered   COVID-19, mRNA, vaccine(Comirnaty)12 years and older 08/25/2022   Hepatitis B 01/11/2013   Influenza Split 07/25/2012, 08/12/2014, 08/21/2015   Influenza, High Dose Seasonal PF 08/02/2017, 07/13/2018, 08/03/2019, 08/06/2020, 08/11/2021, 08/11/2022   Influenza,inj,quad, With Preservative 08/13/2016   PFIZER(Purple Top)SARS-COV-2 Vaccination  12/23/2019, 01/16/2020, 09/20/2020, 09/10/2021   PPD Test 07/31/2014, 07/28/2015, 08/13/2016, 07/13/2018   Pneumococcal Conjugate-13 12/15/2017   Pneumococcal Polysaccharide-23 04/16/2002, 02/16/2022   Tdap 12/21/2011   Zoster Recombinat (Shingrix) 03/04/2022, 06/15/2022   Zoster, Live 10/26/2003   Health Maintenance  Topic Date Due   DTaP/Tdap/Td (2 - Td or Tdap) 12/20/2021   COVID-19 Vaccine (6 - 2023-24 season) 03/05/2023 (Originally 10/20/2022)   INFLUENZA VACCINE  05/26/2023   Medicare Annual Wellness (AWV)  02/17/2024   COLONOSCOPY (Pts 45-37yrs Insurance coverage will need to be confirmed)  08/26/2024   MAMMOGRAM  11/30/2024   Pneumonia Vaccine 62+ Years old  Completed   DEXA SCAN  Completed   Hepatitis C Screening  Completed   Zoster Vaccines- Shingrix  Completed   HPV VACCINES  Aged Out     Names of Other Physician/Practitioners you currently use: 1. [facility] here for primary care 2. Dr. Randon Goldsmith, eye doctor, last visit 05/2022, macular degeneration, seeing Dr. Larry Sierras, R is wet, L also wet, getting injections,last visit 4 /2024 3. Dr. Mora Appl, dentist, last visit 01/2023, goes q36m 3. Dr. Emily Filbert, derm, psoriasis, now seeing annually 11/2022  Patient Care Team: Lucky Cowboy, MD as PCP - General (Internal Medicine) Elmon Else, MD as Consulting Physician (Dermatology) Eldred Manges, MD as Consulting Physician (Orthopedic Surgery)  Surgical History:  She has a past surgical history that includes Tonsillectomy (1962); Appendectomy (1959); Bladder suspension (11/1998); Ureter revision (Left, 11/3003); Extensor tendon of forearm / wrist repair (Left, 2010); ORIF humerus fracture (04/07/2012); Steriod injection (04/07/2012); Breast cyst aspiration (Right, 2011); vaginal hysterectomy with unilateral salpingooopherectomy (2000); Cataract extraction, bilateral (Bilateral, 2016); Cystocele repair (11/10/2018); Cystocele repair (11/10/2018); and  Ganglion cyst excision (Left, 09/29/2021). Family History:  Herfamily history includes Atrial fibrillation in her sister; Colon cancer in her maternal uncle; Colon cancer (age of onset: 103) in her father; Heart disease in her  maternal grandfather and mother; Heart failure (age of onset: 60) in her maternal grandfather; Kidney Stones in her son. Social History:  She reports that she has never smoked. She has never used smokeless tobacco. She reports that she does not drink alcohol and does not use drugs.  MEDICARE WELLNESS OBJECTIVES: Physical activity: Current Exercise Habits: Home exercise routine, Type of exercise: walking, Time (Minutes): 30, Frequency (Times/Week): 5, Weekly Exercise (Minutes/Week): 150, Intensity: Mild, Exercise limited by: orthopedic condition(s) Cardiac risk factors: Cardiac Risk Factors include: advanced age (>80men, >47 women);dyslipidemia;hypertension;microalbuminuria Depression/mood screen:      02/17/2023   11:18 AM  Depression screen PHQ 2/9  Decreased Interest 1  Down, Depressed, Hopeless 1  PHQ - 2 Score 2  Altered sleeping 0  Tired, decreased energy 0  Change in appetite 0  Feeling bad or failure about yourself  0  Trouble concentrating 0  Moving slowly or fidgety/restless 0  Suicidal thoughts 0  PHQ-9 Score 2  Difficult doing work/chores Somewhat difficult    ADLs:     02/17/2023   11:19 AM 01/24/2023    8:06 PM  In your present state of health, do you have any difficulty performing the following activities:  Hearing? 0 0  Vision? 0 0  Difficulty concentrating or making decisions? 0 0  Walking or climbing stairs? 1 0  Dressing or bathing? 0 0  Doing errands, shopping? 0 0     Cognitive Testing  Alert? Yes  Normal Appearance?Yes  Oriented to person? Yes  Place? Yes   Time? Yes  Recall of three objects?  Yes  Can perform simple calculations? Yes  Displays appropriate judgment?Yes  Can read the correct time from a watch face?Yes  EOL  planning: Does Patient Have a Medical Advance Directive?: Yes Type of Advance Directive: Healthcare Power of Attorney, Living will Does patient want to make changes to medical advance directive?: No - Patient declined Copy of Healthcare Power of Attorney in Chart?: No - copy requested     Review of Systems: Review of Systems  Constitutional:  Negative for malaise/fatigue and weight loss.  HENT:  Negative for hearing loss and tinnitus.   Eyes:  Negative for blurred vision and double vision.  Respiratory:  Negative for cough, shortness of breath and wheezing.   Cardiovascular:  Negative for chest pain, palpitations, orthopnea, claudication and leg swelling.  Gastrointestinal:  Negative for abdominal pain, blood in stool, constipation, diarrhea, heartburn, melena, nausea and vomiting.  Genitourinary:  Positive for frequency (chronic). Negative for dysuria, flank pain, hematuria and urgency.  Musculoskeletal:  Negative for back pain (thoracic, improved), joint pain and myalgias.  Skin:  Negative for rash.       Laceration left foot uncertain cause  Neurological:  Negative for dizziness, tingling, sensory change, weakness and headaches.  Endo/Heme/Allergies:  Negative for polydipsia.  Psychiatric/Behavioral: Negative.  Negative for depression and substance abuse. The patient is not nervous/anxious.   All other systems reviewed and are negative.   Physical Exam: Estimated body mass index is 28.68 kg/m as calculated from the following:   Height as of this encounter: 5' 3.5" (1.613 m).   Weight as of this encounter: 164 lb 8 oz (74.6 kg). BP 114/68   Pulse 88   Temp 97.9 F (36.6 C)   Ht 5' 3.5" (1.613 m)   Wt 164 lb 8 oz (74.6 kg)   SpO2 97%   BMI 28.68 kg/m  General Appearance: Well nourished, in no apparent distress.  Eyes: PERRLA, EOMs,  conjunctiva no swelling or erythema; fundal exam deferred to ophth Sinuses: No Frontal/maxillary tenderness  ENT/Mouth: Ext aud canals clear,  normal light reflex with TMs without erythema, bulging. Good dentition. No erythema, swelling, or exudate on post pharynx. Tonsils not swollen or erythematous. Hearing normal.  Neck: Supple, thyroid normal. No bruits  Respiratory: Respiratory effort normal, BS equal bilaterally without rales, rhonchi, wheezing or stridor.  Cardio: RRR without murmurs, rubs or gallops. No carotid or abdominal aortic bruit. Brisk peripheral pulses without edema.  Chest: symmetric, with normal excursions. Breasts: Patient declines, recent mammogram Abdomen: Soft, nontender, no guarding, rebound, hernias, masses, or organomegaly.  Lymphatics: Non tender without lymphadenopathy.  Genitourinary: Deferred Musculoskeletal: Full ROM all peripheral extremities,5/5 strength, and normal gait. Mild/mod kyphosis, irregular but non-tender spinous processes. Let wrist with well healed surgical scar.  Skin: Warm, dry without lesions, ecchymosis. 1-2 cm left foot skin laceration, no signs of infection Neuro: Cranial nerves intact, reflexes equal bilaterally. Normal muscle tone, no cerebellar symptoms. Sensation intact.  Psych: Awake and oriented X 3, normal affect, Insight and Judgment appropriate.    Medicare Attestation I have personally reviewed: The patient's medical and social history Their use of alcohol, tobacco or illicit drugs Their current medications and supplements The patient's functional ability including ADLs,fall risks, home safety risks, cognitive, and hearing and visual impairment Diet and physical activities Evidence for depression or mood disorders  The patient's weight, height, BMI, and visual acuity have been recorded in the chart.  I have made referrals, counseling, and provided education to the patient based on review of the above and I have provided the patient with a written personalized care plan for preventive services.     Raynelle Dick, NP 11:20 AM [facility]

## 2023-02-17 ENCOUNTER — Ambulatory Visit (INDEPENDENT_AMBULATORY_CARE_PROVIDER_SITE_OTHER): Payer: Medicare HMO | Admitting: Nurse Practitioner

## 2023-02-17 ENCOUNTER — Encounter: Payer: Self-pay | Admitting: Nurse Practitioner

## 2023-02-17 VITALS — BP 114/68 | HR 88 | Temp 97.9°F | Ht 63.5 in | Wt 164.5 lb

## 2023-02-17 DIAGNOSIS — I10 Essential (primary) hypertension: Secondary | ICD-10-CM

## 2023-02-17 DIAGNOSIS — Z79899 Other long term (current) drug therapy: Secondary | ICD-10-CM | POA: Diagnosis not present

## 2023-02-17 DIAGNOSIS — N3281 Overactive bladder: Secondary | ICD-10-CM | POA: Diagnosis not present

## 2023-02-17 DIAGNOSIS — S91312D Laceration without foreign body, left foot, subsequent encounter: Secondary | ICD-10-CM

## 2023-02-17 DIAGNOSIS — Z0000 Encounter for general adult medical examination without abnormal findings: Secondary | ICD-10-CM

## 2023-02-17 DIAGNOSIS — F419 Anxiety disorder, unspecified: Secondary | ICD-10-CM

## 2023-02-17 DIAGNOSIS — I700 Atherosclerosis of aorta: Secondary | ICD-10-CM

## 2023-02-17 DIAGNOSIS — E559 Vitamin D deficiency, unspecified: Secondary | ICD-10-CM

## 2023-02-17 DIAGNOSIS — S32592D Other specified fracture of left pubis, subsequent encounter for fracture with routine healing: Secondary | ICD-10-CM

## 2023-02-17 DIAGNOSIS — Z0001 Encounter for general adult medical examination with abnormal findings: Secondary | ICD-10-CM | POA: Diagnosis not present

## 2023-02-17 DIAGNOSIS — H3530 Unspecified macular degeneration: Secondary | ICD-10-CM | POA: Diagnosis not present

## 2023-02-17 DIAGNOSIS — Z86711 Personal history of pulmonary embolism: Secondary | ICD-10-CM

## 2023-02-17 DIAGNOSIS — R8290 Unspecified abnormal findings in urine: Secondary | ICD-10-CM

## 2023-02-17 DIAGNOSIS — E782 Mixed hyperlipidemia: Secondary | ICD-10-CM

## 2023-02-17 DIAGNOSIS — K219 Gastro-esophageal reflux disease without esophagitis: Secondary | ICD-10-CM

## 2023-02-17 DIAGNOSIS — L409 Psoriasis, unspecified: Secondary | ICD-10-CM

## 2023-02-17 DIAGNOSIS — R6889 Other general symptoms and signs: Secondary | ICD-10-CM | POA: Diagnosis not present

## 2023-02-17 DIAGNOSIS — E663 Overweight: Secondary | ICD-10-CM | POA: Diagnosis not present

## 2023-02-17 DIAGNOSIS — R7309 Other abnormal glucose: Secondary | ICD-10-CM

## 2023-02-17 DIAGNOSIS — M8589 Other specified disorders of bone density and structure, multiple sites: Secondary | ICD-10-CM | POA: Diagnosis not present

## 2023-02-17 MED ORDER — ALENDRONATE SODIUM 70 MG PO TABS: ORAL_TABLET | ORAL | 3 refills | Status: DC

## 2023-02-17 NOTE — Patient Instructions (Signed)

## 2023-02-20 ENCOUNTER — Encounter: Payer: Self-pay | Admitting: Nurse Practitioner

## 2023-02-20 LAB — URINE CULTURE
MICRO NUMBER:: 14876048
SPECIMEN QUALITY:: ADEQUATE

## 2023-02-20 LAB — URINALYSIS, ROUTINE W REFLEX MICROSCOPIC
Bacteria, UA: NONE SEEN /HPF
Bilirubin Urine: NEGATIVE
Glucose, UA: NEGATIVE
Ketones, ur: NEGATIVE
Nitrite: NEGATIVE
Protein, ur: NEGATIVE
Specific Gravity, Urine: 1.009 (ref 1.001–1.035)
Squamous Epithelial / HPF: NONE SEEN /HPF (ref <=5)
WBC, UA: >=60 /HPF — AB (ref 0–5)
pH: 7.5 (ref 5.0–8.0)

## 2023-02-20 LAB — LIPID PANEL
Cholesterol: 140 mg/dL (ref <200)
HDL: 69 mg/dL (ref >=50)
LDL Cholesterol (Calc): 56 mg/dL (calc)
Non-HDL Cholesterol (Calc): 71 mg/dL (calc) (ref <130)
Total CHOL/HDL Ratio: 2 (calc) (ref <5.0)
Triglycerides: 69 mg/dL (ref <150)

## 2023-02-20 LAB — CBC WITH DIFFERENTIAL/PLATELET
Absolute Monocytes: 749 cells/uL (ref 200–950)
Basophils Absolute: 62 cells/uL (ref 0–200)
Basophils Relative: 0.8 %
Eosinophils Absolute: 109 cells/uL (ref 15–500)
Eosinophils Relative: 1.4 %
HCT: 46.4 % — ABNORMAL HIGH (ref 35.0–45.0)
Hemoglobin: 15.5 g/dL (ref 11.7–15.5)
Lymphs Abs: 2051 cells/uL (ref 850–3900)
MCH: 30 pg (ref 27.0–33.0)
MCHC: 33.4 g/dL (ref 32.0–36.0)
MCV: 89.7 fL (ref 80.0–100.0)
MPV: 10.1 fL (ref 7.5–12.5)
Monocytes Relative: 9.6 %
Neutro Abs: 4828 cells/uL (ref 1500–7800)
Neutrophils Relative %: 61.9 %
Platelets: 218 10*3/uL (ref 140–400)
RBC: 5.17 10*6/uL — ABNORMAL HIGH (ref 3.80–5.10)
RDW: 13.1 % (ref 11.0–15.0)
Total Lymphocyte: 26.3 %
WBC: 7.8 10*3/uL (ref 3.8–10.8)

## 2023-02-20 LAB — COMPLETE METABOLIC PANEL WITH GFR
AG Ratio: 1.3 (calc) (ref 1.0–2.5)
ALT: 11 U/L (ref 6–29)
AST: 16 U/L (ref 10–35)
Albumin: 4.1 g/dL (ref 3.6–5.1)
Alkaline phosphatase (APISO): 66 U/L (ref 37–153)
BUN: 11 mg/dL (ref 7–25)
CO2: 28 mmol/L (ref 20–32)
Calcium: 9.8 mg/dL (ref 8.6–10.4)
Chloride: 99 mmol/L (ref 98–110)
Creat: 0.63 mg/dL (ref 0.60–1.00)
Globulin: 3.2 g/dL (calc) (ref 1.9–3.7)
Glucose, Bld: 98 mg/dL (ref 65–99)
Potassium: 4.1 mmol/L (ref 3.5–5.3)
Sodium: 138 mmol/L (ref 135–146)
Total Bilirubin: 0.6 mg/dL (ref 0.2–1.2)
Total Protein: 7.3 g/dL (ref 6.1–8.1)
eGFR: 95 mL/min/{1.73_m2} (ref >=60)

## 2023-02-20 LAB — MAGNESIUM: Magnesium: 2.1 mg/dL (ref 1.5–2.5)

## 2023-02-20 LAB — MICROSCOPIC MESSAGE

## 2023-02-20 LAB — TSH: TSH: 1.16 mIU/L (ref 0.40–4.50)

## 2023-02-28 DIAGNOSIS — H43393 Other vitreous opacities, bilateral: Secondary | ICD-10-CM | POA: Diagnosis not present

## 2023-02-28 DIAGNOSIS — H35033 Hypertensive retinopathy, bilateral: Secondary | ICD-10-CM | POA: Diagnosis not present

## 2023-02-28 DIAGNOSIS — H43813 Vitreous degeneration, bilateral: Secondary | ICD-10-CM | POA: Diagnosis not present

## 2023-02-28 DIAGNOSIS — H353231 Exudative age-related macular degeneration, bilateral, with active choroidal neovascularization: Secondary | ICD-10-CM | POA: Diagnosis not present

## 2023-04-21 DIAGNOSIS — H43813 Vitreous degeneration, bilateral: Secondary | ICD-10-CM | POA: Diagnosis not present

## 2023-04-21 DIAGNOSIS — H353231 Exudative age-related macular degeneration, bilateral, with active choroidal neovascularization: Secondary | ICD-10-CM | POA: Diagnosis not present

## 2023-04-21 DIAGNOSIS — H35033 Hypertensive retinopathy, bilateral: Secondary | ICD-10-CM | POA: Diagnosis not present

## 2023-04-21 DIAGNOSIS — H43393 Other vitreous opacities, bilateral: Secondary | ICD-10-CM | POA: Diagnosis not present

## 2023-05-12 ENCOUNTER — Encounter: Payer: Self-pay | Admitting: Nurse Practitioner

## 2023-05-12 ENCOUNTER — Other Ambulatory Visit: Payer: Self-pay | Admitting: Nurse Practitioner

## 2023-05-12 DIAGNOSIS — N3281 Overactive bladder: Secondary | ICD-10-CM

## 2023-05-12 MED ORDER — SOLIFENACIN SUCCINATE 5 MG PO TABS: 5.0000 mg | ORAL_TABLET | Freq: Every day | ORAL | 3 refills | Status: DC

## 2023-06-02 DIAGNOSIS — H353231 Exudative age-related macular degeneration, bilateral, with active choroidal neovascularization: Secondary | ICD-10-CM | POA: Diagnosis not present

## 2023-06-02 DIAGNOSIS — H43393 Other vitreous opacities, bilateral: Secondary | ICD-10-CM | POA: Diagnosis not present

## 2023-06-02 DIAGNOSIS — H43813 Vitreous degeneration, bilateral: Secondary | ICD-10-CM | POA: Diagnosis not present

## 2023-06-02 DIAGNOSIS — H35033 Hypertensive retinopathy, bilateral: Secondary | ICD-10-CM | POA: Diagnosis not present

## 2023-06-09 ENCOUNTER — Other Ambulatory Visit: Payer: Self-pay

## 2023-06-09 MED ORDER — DULOXETINE HCL 60 MG PO CPEP: ORAL_CAPSULE | ORAL | 3 refills | Status: DC

## 2023-06-13 ENCOUNTER — Other Ambulatory Visit: Payer: Self-pay | Admitting: Internal Medicine

## 2023-06-13 ENCOUNTER — Encounter: Payer: Self-pay | Admitting: Nurse Practitioner

## 2023-06-13 ENCOUNTER — Ambulatory Visit: Payer: Medicare HMO | Admitting: Nurse Practitioner

## 2023-06-13 VITALS — BP 122/72 | HR 76 | Temp 97.9°F | Ht 63.5 in | Wt 168.0 lb

## 2023-06-13 DIAGNOSIS — M8589 Other specified disorders of bone density and structure, multiple sites: Secondary | ICD-10-CM | POA: Diagnosis not present

## 2023-06-13 DIAGNOSIS — I10 Essential (primary) hypertension: Secondary | ICD-10-CM | POA: Diagnosis not present

## 2023-06-13 DIAGNOSIS — G8929 Other chronic pain: Secondary | ICD-10-CM

## 2023-06-13 DIAGNOSIS — M549 Dorsalgia, unspecified: Secondary | ICD-10-CM | POA: Diagnosis not present

## 2023-06-13 MED ORDER — DULOXETINE HCL 60 MG PO CPEP: ORAL_CAPSULE | ORAL | 3 refills | Status: DC
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
## 2023-06-14 ENCOUNTER — Ambulatory Visit
Admission: RE | Admit: 2023-06-14 | Discharge: 2023-06-14 | Disposition: A | Discharge status: Home / Self Care | Payer: Medicare HMO | Source: Ambulatory Visit | Attending: Nurse Practitioner | Admitting: Nurse Practitioner

## 2023-06-14 ENCOUNTER — Encounter: Payer: Self-pay | Admitting: Nurse Practitioner

## 2023-06-14 DIAGNOSIS — M549 Dorsalgia, unspecified: Secondary | ICD-10-CM

## 2023-06-14 DIAGNOSIS — M542 Cervicalgia: Secondary | ICD-10-CM | POA: Diagnosis not present

## 2023-06-30 ENCOUNTER — Ambulatory Visit (INDEPENDENT_AMBULATORY_CARE_PROVIDER_SITE_OTHER): Payer: Medicare HMO | Admitting: Internal Medicine

## 2023-06-30 ENCOUNTER — Encounter: Payer: Self-pay | Admitting: Internal Medicine

## 2023-06-30 VITALS — BP 118/92 | HR 115 | Temp 97.5°F | Resp 16 | Ht 63.5 in | Wt 167.0 lb

## 2023-06-30 DIAGNOSIS — R0789 Other chest pain: Secondary | ICD-10-CM

## 2023-06-30 DIAGNOSIS — S22080S Wedge compression fracture of T11-T12 vertebra, sequela: Secondary | ICD-10-CM

## 2023-06-30 DIAGNOSIS — M549 Dorsalgia, unspecified: Secondary | ICD-10-CM

## 2023-06-30 MED ORDER — PREDNISONE 10 MG PO TABS: ORAL_TABLET | ORAL | 0 refills | Status: DC

## 2023-06-30 MED ORDER — CYCLOBENZAPRINE HCL 10 MG PO TABS
ORAL_TABLET | ORAL | 0 refills | Status: DC
Start: 2023-06-30 — End: 2023-08-19
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
## 2023-07-06 ENCOUNTER — Other Ambulatory Visit: Payer: Self-pay | Admitting: Internal Medicine

## 2023-07-06 DIAGNOSIS — S22080S Wedge compression fracture of T11-T12 vertebra, sequela: Secondary | ICD-10-CM

## 2023-07-06 DIAGNOSIS — M545 Low back pain, unspecified: Secondary | ICD-10-CM

## 2023-07-06 DIAGNOSIS — M549 Dorsalgia, unspecified: Secondary | ICD-10-CM

## 2023-07-11 ENCOUNTER — Other Ambulatory Visit: Payer: Self-pay | Admitting: Internal Medicine

## 2023-07-11 NOTE — Addendum Note (Signed)
Addended by: Anda Kraft E on: 07/11/2023 02:33 PM   Modules accepted: Orders

## 2023-07-12 ENCOUNTER — Ambulatory Visit: Payer: Medicare HMO | Admitting: Orthopaedic Surgery

## 2023-07-12 VITALS — BP 140/100 | HR 90

## 2023-07-12 DIAGNOSIS — S32010A Wedge compression fracture of first lumbar vertebra, initial encounter for closed fracture: Secondary | ICD-10-CM | POA: Diagnosis not present

## 2023-07-12 DIAGNOSIS — S32000A Wedge compression fracture of unspecified lumbar vertebra, initial encounter for closed fracture: Secondary | ICD-10-CM | POA: Insufficient documentation
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
## 2023-07-12 NOTE — Addendum Note (Signed)
Addended by: Hans Eden on: 07/12/2023 10:35 AM   Modules accepted: Orders

## 2023-07-14 ENCOUNTER — Other Ambulatory Visit (HOSPITAL_BASED_OUTPATIENT_CLINIC_OR_DEPARTMENT_OTHER): Payer: Medicare HMO

## 2023-07-17 ENCOUNTER — Encounter: Payer: Self-pay | Admitting: Orthopaedic Surgery

## 2023-07-18 DIAGNOSIS — H43813 Vitreous degeneration, bilateral: Secondary | ICD-10-CM | POA: Diagnosis not present

## 2023-07-18 DIAGNOSIS — H35033 Hypertensive retinopathy, bilateral: Secondary | ICD-10-CM | POA: Diagnosis not present

## 2023-07-18 DIAGNOSIS — H43393 Other vitreous opacities, bilateral: Secondary | ICD-10-CM | POA: Diagnosis not present

## 2023-07-18 DIAGNOSIS — H353231 Exudative age-related macular degeneration, bilateral, with active choroidal neovascularization: Secondary | ICD-10-CM | POA: Diagnosis not present

## 2023-07-22 NOTE — Telephone Encounter (Signed)
Case Number: [id number] Review Date: 07/22/2023 12:13:35 PM Expiration Date: N/A Status: There is a denied request for the same procedure on file. Please utilize the Authorization Lookup page to obtain your post-denial options and if the case on file is eligible for reconsideration or appeal. Medical records required Additional Clinical Uploaded On the Web. The prior authorization you submitted, Case [id number], has been received. Additional case status notifications will be sent if you opted in for email notifications. Thank you.

## 2023-07-28 ENCOUNTER — Encounter: Payer: Self-pay | Admitting: Orthopaedic Surgery

## 2023-08-05 ENCOUNTER — Ambulatory Visit
Admission: RE | Admit: 2023-08-05 | Discharge: 2023-08-05 | Disposition: A | Discharge status: Home / Self Care | Payer: Medicare HMO | Source: Ambulatory Visit | Attending: Orthopaedic Surgery | Admitting: Orthopaedic Surgery

## 2023-08-05 DIAGNOSIS — S32010A Wedge compression fracture of first lumbar vertebra, initial encounter for closed fracture: Secondary | ICD-10-CM

## 2023-08-05 DIAGNOSIS — M4854XA Collapsed vertebra, not elsewhere classified, thoracic region, initial encounter for fracture: Secondary | ICD-10-CM | POA: Diagnosis not present

## 2023-08-05 DIAGNOSIS — M48061 Spinal stenosis, lumbar region without neurogenic claudication: Secondary | ICD-10-CM | POA: Diagnosis not present

## 2023-08-10 DIAGNOSIS — H40053 Ocular hypertension, bilateral: Secondary | ICD-10-CM | POA: Diagnosis not present

## 2023-08-10 DIAGNOSIS — H353231 Exudative age-related macular degeneration, bilateral, with active choroidal neovascularization: Secondary | ICD-10-CM | POA: Diagnosis not present

## 2023-08-10 DIAGNOSIS — Z961 Presence of intraocular lens: Secondary | ICD-10-CM | POA: Diagnosis not present

## 2023-08-10 DIAGNOSIS — H26493 Other secondary cataract, bilateral: Secondary | ICD-10-CM | POA: Diagnosis not present

## 2023-08-10 DIAGNOSIS — H5213 Myopia, bilateral: Secondary | ICD-10-CM | POA: Diagnosis not present

## 2023-08-16 ENCOUNTER — Ambulatory Visit (INDEPENDENT_AMBULATORY_CARE_PROVIDER_SITE_OTHER): Payer: Medicare HMO | Admitting: Orthopaedic Surgery

## 2023-08-16 VITALS — BP 115/78 | HR 106 | Ht 63.5 in | Wt 167.0 lb

## 2023-08-16 DIAGNOSIS — S22000D Wedge compression fracture of unspecified thoracic vertebra, subsequent encounter for fracture with routine healing: Secondary | ICD-10-CM | POA: Diagnosis not present

## 2023-08-16 DIAGNOSIS — S22000S Wedge compression fracture of unspecified thoracic vertebra, sequela: Secondary | ICD-10-CM
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
## 2023-08-18 NOTE — Addendum Note (Signed)
Addended by: Rogers Seeds on: 08/18/2023 09:31 AM   Modules accepted: Orders

## 2023-08-19 ENCOUNTER — Other Ambulatory Visit: Payer: Self-pay | Admitting: Internal Medicine

## 2023-08-19 NOTE — Telephone Encounter (Signed)
[employer] authorization [id number] valid 01/21/24

## 2023-08-22 ENCOUNTER — Encounter: Payer: Self-pay | Admitting: Nurse Practitioner

## 2023-08-22 ENCOUNTER — Ambulatory Visit (INDEPENDENT_AMBULATORY_CARE_PROVIDER_SITE_OTHER): Payer: Medicare HMO | Admitting: Nurse Practitioner

## 2023-08-22 VITALS — BP 106/70 | HR 101 | Temp 98.2°F | Ht 62.5 in | Wt 163.0 lb

## 2023-08-22 DIAGNOSIS — Z136 Encounter for screening for cardiovascular disorders: Secondary | ICD-10-CM

## 2023-08-22 DIAGNOSIS — K219 Gastro-esophageal reflux disease without esophagitis: Secondary | ICD-10-CM | POA: Diagnosis not present

## 2023-08-22 DIAGNOSIS — I700 Atherosclerosis of aorta: Secondary | ICD-10-CM | POA: Diagnosis not present

## 2023-08-22 DIAGNOSIS — S22000S Wedge compression fracture of unspecified thoracic vertebra, sequela: Secondary | ICD-10-CM

## 2023-08-22 DIAGNOSIS — E538 Deficiency of other specified B group vitamins: Secondary | ICD-10-CM | POA: Diagnosis not present

## 2023-08-22 DIAGNOSIS — Z79899 Other long term (current) drug therapy: Secondary | ICD-10-CM

## 2023-08-22 DIAGNOSIS — F419 Anxiety disorder, unspecified: Secondary | ICD-10-CM

## 2023-08-22 DIAGNOSIS — Z86711 Personal history of pulmonary embolism: Secondary | ICD-10-CM

## 2023-08-22 DIAGNOSIS — Z23 Encounter for immunization: Secondary | ICD-10-CM | POA: Diagnosis not present

## 2023-08-22 DIAGNOSIS — Z8781 Personal history of (healed) traumatic fracture: Secondary | ICD-10-CM

## 2023-08-22 DIAGNOSIS — H3530 Unspecified macular degeneration: Secondary | ICD-10-CM

## 2023-08-22 DIAGNOSIS — N3281 Overactive bladder: Secondary | ICD-10-CM

## 2023-08-22 DIAGNOSIS — Z0001 Encounter for general adult medical examination with abnormal findings: Secondary | ICD-10-CM

## 2023-08-22 DIAGNOSIS — Z1389 Encounter for screening for other disorder: Secondary | ICD-10-CM | POA: Diagnosis not present

## 2023-08-22 DIAGNOSIS — Z0000 Encounter for general adult medical examination without abnormal findings: Secondary | ICD-10-CM

## 2023-08-22 DIAGNOSIS — E559 Vitamin D deficiency, unspecified: Secondary | ICD-10-CM | POA: Diagnosis not present

## 2023-08-22 DIAGNOSIS — I10 Essential (primary) hypertension: Secondary | ICD-10-CM

## 2023-08-22 DIAGNOSIS — E663 Overweight: Secondary | ICD-10-CM

## 2023-08-22 DIAGNOSIS — M8589 Other specified disorders of bone density and structure, multiple sites: Secondary | ICD-10-CM

## 2023-08-22 DIAGNOSIS — R609 Edema, unspecified: Secondary | ICD-10-CM

## 2023-08-22 DIAGNOSIS — R7309 Other abnormal glucose: Secondary | ICD-10-CM | POA: Diagnosis not present

## 2023-08-22 DIAGNOSIS — L409 Psoriasis, unspecified: Secondary | ICD-10-CM

## 2023-08-22 MED ORDER — HYDROCHLOROTHIAZIDE 25 MG PO TABS: ORAL_TABLET | ORAL | 3 refills | Status: DC

## 2023-08-22 NOTE — Patient Instructions (Signed)
Osteoporosis  Osteoporosis is when the bones get thin and weak. This can cause your bones to break (fracture) more easily. What are the causes? The exact cause of this condition is not known. What increases the risk? Having family members with this condition. Not eating enough healthy foods. Taking certain medicines. Being female. Being age 71 or older. Smoking or using other products that contain nicotine or tobacco, such as e-cigarettes or chewing tobacco. Not exercising. Being of European or Asian ancestry. Having a small body frame. What are the signs or symptoms? A broken bone might be the first sign, especially if the break results from a fall or injury that usually would not cause a bone to break. Other signs and symptoms include: Pain in the neck or low back. Being hunched over (stooped posture). Getting shorter. How is this treated? Eating more foods with more calcium and vitamin D in them. Doing exercises. Stopping tobacco use. Limiting how much alcohol you drink. Taking medicines to slow bone loss or help make the bones stronger. Taking supplements of calcium and vitamin D every day. Taking medicines to replace chemicals in the body (hormone replacement medicines). Monitoring your levels of calcium and vitamin D. The goal of treatment is to strengthen your bones and lower your risk for a bone break. Follow these instructions at home: Eating and drinking Eat plenty of calcium and vitamin D. These nutrients are good for your bones. Good sources of calcium and vitamin D include: Some fish, such as salmon and tuna. Foods that have calcium and vitamin D added to them (fortified foods), such as some breakfast cereals. Egg yolks. Cheese. Liver.  Activity Do exercises as told by your doctor. Ask your doctor what exercises are safe for you. You should do: Exercises that make your muscles work to hold your body weight up (weight-bearing exercises). These include tai chi,  yoga, and walking. Exercises to make your muscles stronger. One example is lifting weights. Lifestyle Do not drink alcohol if: Your doctor tells you not to drink. You are pregnant, may be pregnant, or are planning to become pregnant. If you drink alcohol: Limit how much you use to: 0-1 drink a day for women. 0-2 drinks a day for men. Know how much alcohol is in your drink. In the [place] one drink equals one 12 oz bottle of beer (355 mL), one 5 oz glass of wine (148 mL), or one 1 oz glass of hard liquor (44 mL). Do not smoke or use any products that contain nicotine or tobacco. If you need help quitting, ask your doctor. Preventing falls Use tools to help you move around (mobility aids) as needed. These include canes, walkers, scooters, and crutches. Keep rooms well-lit. Put away things on the floor that could make you trip. These include cords and rugs. Install safety rails on stairs. Install grab bars in bathrooms. Use rubber mats in slippery areas, like bathrooms. Wear shoes that: Fit you well. Support your feet. Have closed toes. Have rubber soles or low heels. Tell your doctor about all of the medicines you are taking. Some medicines can make you more likely to fall. General instructions Take over-the-counter and prescription medicines only as told by your doctor. Keep all follow-up visits. Contact a doctor if: You have not been tested (screened) for osteoporosis and you are: A woman who is age 65 or older. A man who is age 70 or older. Get help right away if: You fall. You get hurt. Summary Osteoporosis happens when your   bones get thin and weak. Weak bones can break (fracture) more easily. Eat plenty of calcium and vitamin D. These are good for your bones. Tell your doctor about all of the medicines that you take. This information is not intended to replace advice given to you by your health care provider. Make sure you discuss any questions you have with your health care  provider. Document Revised: 03/27/2020 Document Reviewed: 03/27/2020 Elsevier Patient Education  2024 [employer]  
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
## 2023-08-23 ENCOUNTER — Other Ambulatory Visit: Payer: Self-pay | Admitting: Nurse Practitioner

## 2023-08-23 DIAGNOSIS — R82998 Other abnormal findings in urine: Secondary | ICD-10-CM | POA: Diagnosis not present

## 2023-08-23 LAB — CBC WITH DIFFERENTIAL/PLATELET
Absolute Lymphocytes: 1526 {cells}/uL (ref 850–3900)
Absolute Monocytes: 713 {cells}/uL (ref 200–950)
Basophils Absolute: 43 {cells}/uL (ref 0–200)
Basophils Relative: 0.6 %
Eosinophils Absolute: 108 {cells}/uL (ref 15–500)
Eosinophils Relative: 1.5 %
HCT: 47.1 % — ABNORMAL HIGH (ref 35.0–45.0)
Hemoglobin: 15.7 g/dL — ABNORMAL HIGH (ref 11.7–15.5)
MCH: 30.5 pg (ref 27.0–33.0)
MCHC: 33.3 g/dL (ref 32.0–36.0)
MCV: 91.6 fL (ref 80.0–100.0)
MPV: 9.7 fL (ref 7.5–12.5)
Monocytes Relative: 9.9 %
Neutro Abs: 4810 {cells}/uL (ref 1500–7800)
Neutrophils Relative %: 66.8 %
Platelets: 242 10*3/uL (ref 140–400)
RBC: 5.14 10*6/uL — ABNORMAL HIGH (ref 3.80–5.10)
RDW: 12.8 % (ref 11.0–15.0)
Total Lymphocyte: 21.2 %
WBC: 7.2 10*3/uL (ref 3.8–10.8)

## 2023-08-23 LAB — VITAMIN D 25 HYDROXY (VIT D DEFICIENCY, FRACTURES): Vit D, 25-Hydroxy: 71 ng/mL (ref 30–100)

## 2023-08-23 LAB — COMPLETE METABOLIC PANEL WITH GFR
AG Ratio: 1.2 (calc) (ref 1.0–2.5)
ALT: 11 U/L (ref 6–29)
AST: 16 U/L (ref 10–35)
Albumin: 4.2 g/dL (ref 3.6–5.1)
Alkaline phosphatase (APISO): 77 U/L (ref 37–153)
BUN: 10 mg/dL (ref 7–25)
CO2: 30 mmol/L (ref 20–32)
Calcium: 9.7 mg/dL (ref 8.6–10.4)
Chloride: 94 mmol/L — ABNORMAL LOW (ref 98–110)
Creat: 0.72 mg/dL (ref 0.60–1.00)
Globulin: 3.4 g/dL (ref 1.9–3.7)
Glucose, Bld: 98 mg/dL (ref 65–99)
Potassium: 4.1 mmol/L (ref 3.5–5.3)
Sodium: 137 mmol/L (ref 135–146)
Total Bilirubin: 0.8 mg/dL (ref 0.2–1.2)
Total Protein: 7.6 g/dL (ref 6.1–8.1)
eGFR: 89 mL/min/{1.73_m2} (ref >=60)

## 2023-08-23 LAB — LIPID PANEL
Cholesterol: 141 mg/dL (ref <200)
HDL: 65 mg/dL (ref >=50)
LDL Cholesterol (Calc): 59 mg/dL
Non-HDL Cholesterol (Calc): 76 mg/dL (ref <130)
Total CHOL/HDL Ratio: 2.2 (calc) (ref <5.0)
Triglycerides: 85 mg/dL (ref <150)

## 2023-08-23 LAB — MICROALBUMIN / CREATININE URINE RATIO
Creatinine, Urine: 126 mg/dL (ref 20–275)
Microalb Creat Ratio: 51 mg/g{creat} — ABNORMAL HIGH (ref <30)
Microalb, Ur: 6.4 mg/dL

## 2023-08-23 LAB — URINALYSIS, ROUTINE W REFLEX MICROSCOPIC
Bacteria, UA: NONE SEEN /[HPF]
Bilirubin Urine: NEGATIVE
Glucose, UA: NEGATIVE
Nitrite: NEGATIVE
Specific Gravity, Urine: 1.017 (ref 1.001–1.035)
Squamous Epithelial / HPF: NONE SEEN /[HPF] (ref <=5)
WBC, UA: >=60 /[HPF] — AB (ref 0–5)
pH: 6.5 (ref 5.0–8.0)

## 2023-08-23 LAB — HEMOGLOBIN A1C
Hgb A1c MFr Bld: 5.9 %{Hb} — ABNORMAL HIGH (ref <5.7)
Mean Plasma Glucose: 123 mg/dL
eAG (mmol/L): 6.8 mmol/L

## 2023-08-23 LAB — INSULIN, RANDOM: Insulin: 14 u[IU]/mL

## 2023-08-23 LAB — TSH: TSH: 1.12 m[IU]/L (ref 0.40–4.50)

## 2023-08-23 LAB — VITAMIN B12: Vitamin B-12: >2000 pg/mL — ABNORMAL HIGH (ref 200–1100)

## 2023-08-23 LAB — MAGNESIUM: Magnesium: 1.9 mg/dL (ref 1.5–2.5)

## 2023-08-25 ENCOUNTER — Other Ambulatory Visit: Payer: Self-pay | Admitting: Nurse Practitioner

## 2023-08-25 MED ORDER — NITROFURANTOIN MONOHYD MACRO 100 MG PO CAPS: 100.0000 mg | ORAL_CAPSULE | Freq: Two times a day (BID) | ORAL | 0 refills | Status: AC

## 2023-08-26 LAB — URINE CULTURE
MICRO NUMBER:: 15659717
SPECIMEN QUALITY:: ADEQUATE

## 2023-09-05 ENCOUNTER — Ambulatory Visit: Payer: Medicare HMO | Admitting: Physician Assistant

## 2023-09-06 ENCOUNTER — Encounter: Payer: Self-pay | Admitting: Physician Assistant

## 2023-09-06 ENCOUNTER — Ambulatory Visit: Payer: Medicare HMO | Admitting: Physician Assistant

## 2023-09-06 VITALS — Ht 62.0 in | Wt 170.0 lb

## 2023-09-06 DIAGNOSIS — S22000S Wedge compression fracture of unspecified thoracic vertebra, sequela: Secondary | ICD-10-CM

## 2023-09-06 DIAGNOSIS — M810 Age-related osteoporosis without current pathological fracture: Secondary | ICD-10-CM | POA: Diagnosis not present
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
## 2023-09-06 NOTE — Addendum Note (Signed)
Addended by: Michaele Offer on: 09/06/2023 03:08 PM   Modules accepted: Orders

## 2023-09-07 LAB — EXTRA SPECIMEN

## 2023-09-07 LAB — PARATHYROID HORMONE, INTACT (NO CA)

## 2023-09-08 ENCOUNTER — Telehealth: Payer: Self-pay

## 2023-09-08 DIAGNOSIS — H353231 Exudative age-related macular degeneration, bilateral, with active choroidal neovascularization: Secondary | ICD-10-CM | POA: Diagnosis not present

## 2023-09-08 NOTE — Telephone Encounter (Signed)
-----   Message from [facility] Latoria L sent at 09/06/2023  4:28 PM EST ----- Regarding: [employer] Can we please get an authorization for [employer] please

## 2023-09-08 NOTE — Telephone Encounter (Signed)
 Submitted for [employer] through [employer]

## 2023-09-09 ENCOUNTER — Telehealth: Payer: Self-pay | Admitting: Physician Assistant

## 2023-09-09 DIAGNOSIS — S22000S Wedge compression fracture of unspecified thoracic vertebra, sequela: Secondary | ICD-10-CM

## 2023-09-09 NOTE — Telephone Encounter (Signed)
Pt called about bloodwork results. Pt states came on her mychart and then said cancelled. Please call pt about this matter at [phone]

## 2023-09-12 ENCOUNTER — Ambulatory Visit: Payer: Medicare HMO

## 2023-09-13 LAB — PARATHYROID HORMONE, INTACT (NO CA): PTH: 43 pg/mL (ref 16–77)

## 2023-09-13 LAB — EXTRA SPECIMEN
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
## 2023-09-15 ENCOUNTER — Telehealth: Payer: Self-pay

## 2023-09-15 NOTE — Telephone Encounter (Signed)
PA was started with [facility] for Marydel, but was advised by Victorino Dike that the preferred drug is Forteo.

## 2023-09-19 ENCOUNTER — Telehealth: Payer: Self-pay

## 2023-09-19 ENCOUNTER — Encounter: Payer: Self-pay | Admitting: Rehabilitative and Restorative Service Providers"

## 2023-09-19 ENCOUNTER — Ambulatory Visit: Payer: Medicare HMO | Admitting: Rehabilitative and Restorative Service Providers"

## 2023-09-19 DIAGNOSIS — R262 Difficulty in walking, not elsewhere classified: Secondary | ICD-10-CM | POA: Diagnosis not present

## 2023-09-19 DIAGNOSIS — M6281 Muscle weakness (generalized): Secondary | ICD-10-CM

## 2023-09-19 DIAGNOSIS — R293 Abnormal posture: Secondary | ICD-10-CM

## 2023-09-19 DIAGNOSIS — M5459 Other low back pain: Secondary | ICD-10-CM

## 2023-09-19 NOTE — Telephone Encounter (Signed)
Pt came down from PT she missed a call about injection from April she is aware insurance denied the injection would like to know how to follow up? Please advise

## 2023-09-19 NOTE — Telephone Encounter (Signed)
Called and left a VM for patient to return my call concerning Evenity injection.

## 2023-09-20 NOTE — Telephone Encounter (Signed)
Talked to patient and advised her about pricing for [employer] and Forteo.  Stated that she is not able to afford it.  Patient stated that she received a call from insurance stating that Vanessa French was denied.  Patient would like to know what the next option would be.  Please advise.  Thank you.

## 2023-09-21 ENCOUNTER — Other Ambulatory Visit: Payer: Self-pay

## 2023-09-21 ENCOUNTER — Encounter (HOSPITAL_BASED_OUTPATIENT_CLINIC_OR_DEPARTMENT_OTHER): Payer: Self-pay | Admitting: Emergency Medicine

## 2023-09-21 ENCOUNTER — Emergency Department (HOSPITAL_BASED_OUTPATIENT_CLINIC_OR_DEPARTMENT_OTHER): Payer: Medicare HMO

## 2023-09-21 ENCOUNTER — Emergency Department (HOSPITAL_BASED_OUTPATIENT_CLINIC_OR_DEPARTMENT_OTHER): Payer: Medicare HMO | Admitting: Radiology

## 2023-09-21 ENCOUNTER — Emergency Department (HOSPITAL_BASED_OUTPATIENT_CLINIC_OR_DEPARTMENT_OTHER)
Admission: EM | Admit: 2023-09-21 | Discharge: 2023-09-21 | Disposition: A | Discharge status: Home / Self Care | Payer: Medicare HMO | Attending: Emergency Medicine | Admitting: Emergency Medicine

## 2023-09-21 DIAGNOSIS — S42291A Other displaced fracture of upper end of right humerus, initial encounter for closed fracture: Secondary | ICD-10-CM | POA: Diagnosis not present

## 2023-09-21 DIAGNOSIS — W010XXA Fall on same level from slipping, tripping and stumbling without subsequent striking against object, initial encounter: Secondary | ICD-10-CM | POA: Insufficient documentation

## 2023-09-21 DIAGNOSIS — R0781 Pleurodynia: Secondary | ICD-10-CM | POA: Insufficient documentation

## 2023-09-21 DIAGNOSIS — M1611 Unilateral primary osteoarthritis, right hip: Secondary | ICD-10-CM | POA: Diagnosis not present

## 2023-09-21 DIAGNOSIS — M25511 Pain in right shoulder: Secondary | ICD-10-CM | POA: Diagnosis not present

## 2023-09-21 DIAGNOSIS — S0990XA Unspecified injury of head, initial encounter: Secondary | ICD-10-CM | POA: Diagnosis not present

## 2023-09-21 DIAGNOSIS — Z91040 Latex allergy status: Secondary | ICD-10-CM | POA: Insufficient documentation

## 2023-09-21 DIAGNOSIS — M25551 Pain in right hip: Secondary | ICD-10-CM | POA: Diagnosis not present

## 2023-09-21 DIAGNOSIS — Z79899 Other long term (current) drug therapy: Secondary | ICD-10-CM | POA: Insufficient documentation

## 2023-09-21 DIAGNOSIS — G8911 Acute pain due to trauma: Secondary | ICD-10-CM | POA: Diagnosis not present

## 2023-09-21 DIAGNOSIS — W19XXXA Unspecified fall, initial encounter: Secondary | ICD-10-CM

## 2023-09-21 DIAGNOSIS — I10 Essential (primary) hypertension: Secondary | ICD-10-CM | POA: Insufficient documentation

## 2023-09-21 DIAGNOSIS — Z7982 Long term (current) use of aspirin: Secondary | ICD-10-CM | POA: Insufficient documentation

## 2023-09-21 NOTE — ED Provider Notes (Signed)
[place] EMERGENCY DEPARTMENT AT [facility] Provider Note   CSN: [id number] Arrival date & time: 09/21/23  1048     History  No chief complaint on file.   Vanessa French is a 72 y.o. female.  HPI   72 year old female presents emergency department with complaints of fall.  To the fall occurred last night around 6 PM.  States that she was walking out of her den and tripped on the transition strip going into her dining room.  Reports landing on her right side.  Was able to get up independently but since then, has had right-sided hip pain, right-sided shoulder pain as well as right-sided collarbone pain.  Reports bracing her head with her right arm during fall with no direct trauma with ground.  Reports daily aspirin use but denies any anticoagulation use.  Denies any visual disturbance, gait abnormality, slurred speech, facial droop, weakness/sensory deficits.  Denies any shortness of breath, abdominal pain, nausea, vomiting.  Has been able to ambulate since incident without difficulty.  States that she noticed large hematoma right hip as well as some swelling/bruising of the collarbone on the right side.  Past medical history significant for PE, GERD, thoracic/lumbar compression fracture, hypertension, diverticulosis, hyperlipidemia,  Home Medications Prior to Admission medications   Medication Sig Start Date End Date Taking? Authorizing Provider  alendronate (FOSAMAX) 70 MG tablet Take 1 tablet  on an empty stomach with a full glass of water & remain erect for 30 mins, then Repeat every 7 daysf 02/17/23   Raynelle Dick, NP  aspirin EC 81 MG tablet Takes 1 tablet Daily 01/24/23   Lucky Cowboy, MD  calcipotriene (DOVONOX) 0.005 % cream Apply 1 application topically at bedtime.    [provider]  calcium carbonate (TUMS EX) 750 MG chewable tablet Chew 1 tablet by mouth daily.    [provider]  cetirizine (ZYRTEC) 10 MG tablet Take 10 mg by mouth as  needed for allergies.    [provider]  Cholecalciferol (VITAMIN D-3) 125 MCG (5000 UT) TABS Take one tablet daily 01/24/23   Lucky Cowboy, MD  CRANBERRY PO Take by mouth. Take 2 caplets BID    [provider]  Cyanocobalamin (VITAMIN B-12) 1000 MCG SUBL Takes SL 3 x / week 01/24/23   Lucky Cowboy, MD  desonide (DESOWEN) 0.05 % ointment Apply 1 Application topically as needed.    [provider]  DULoxetine (CYMBALTA) 60 MG capsule Take  1 capsule  Daily  for Mood & Chronic Back Pain 06/13/23   Lucky Cowboy, MD  Faricimab-svoa (VABYSMO IZ) by Intravitreal route every 30 (thirty) days.    [provider]  hydrochlorothiazide (HYDRODIURIL) 25 MG tablet Take 1/2-1 tablet daily 08/22/23   Adela Glimpse, NP  Menaquinone-7 (K2 PO) Take by mouth.    [provider]  omeprazole (PRILOSEC) 40 MG capsule Take 1 capsule Daily for Indigestion & Heartburn 12/11/22   Lucky Cowboy, MD  pseudoephedrine (SUDAFED) 120 MG 12 hr tablet Take  1 tablet  2 x /day (every 12 hours)  for Sinus & Chest Congestion 09/20/21   Lucky Cowboy, MD  rosuvastatin (CRESTOR) 5 MG tablet Take 1 tab daily for cholesterol. 01/29/23   Lucky Cowboy, MD  solifenacin (VESICARE) 5 MG tablet Take 1 tablet (5 mg total) by mouth daily. 1 tablet Orally Once a day 05/12/23   Raynelle Dick, NP  triamcinolone cream (KENALOG) 0.1 % Apply 1 application topically 2 (two) times daily. Patient taking  differently: Apply 1 application  topically as needed. 02/21/19   Judd Gaudier, NP      Allergies    Penicillins and Latex    Review of Systems   Review of Systems  All other systems reviewed and are negative.   Physical Exam Updated Vital Signs BP 119/85 (BP Location: Left Arm)   Pulse (!) 109   Temp 98.8 F (37.1 C)   Resp 18   Ht 5\' 3"  (1.6 m)   Wt 75.3 kg   SpO2 99%   BMI 29.41 kg/m  Physical Exam Vitals and nursing note reviewed.  Constitutional:      General: She  is not in acute distress.    Appearance: She is well-developed.  HENT:     Head: Normocephalic and atraumatic.  Eyes:     Conjunctiva/sclera: Conjunctivae normal.  Cardiovascular:     Rate and Rhythm: Normal rate and regular rhythm.  Pulmonary:     Effort: Pulmonary effort is normal. No respiratory distress.     Breath sounds: Normal breath sounds.  Abdominal:     Palpations: Abdomen is soft.     Tenderness: There is no abdominal tenderness.  Musculoskeletal:        General: No swelling.     Cervical back: Neck supple.     Comments: No midline tenderness of cervical, thoracic, lumbar spine without step-off or deformity noted.  Tender to palpation distal right clavicle with some overlying swelling/ecchymosis.  Patient with tender to palpation right proximal humerus able to fully range shoulder but with pain.  Small superficial abrasion appreciated right elbow without underlying tenderness to palpation.  Otherwise, no tender palpation of upper extremities full range of motion of bilateral elbows, wrist, digits.  Large hematoma appreciated lateral aspect of right hip.  Patient with overlying tenderness of said area.  Patient with full range of motion of bilateral hips, knees, ankles, digits.  Able to ambulate independently.  Skin:    General: Skin is warm and dry.     Capillary Refill: Capillary refill takes less than 2 seconds.  Neurological:     Mental Status: She is alert.     Comments: Alert and oriented to self, place, time and event.   Speech is fluent, clear without dysarthria or dysphasia.   Strength symmetric in upper/lower extremities   Sensation intact in upper/lower extremities   Patient ambulates independently as at baseline CN I not tested  CN II not tested CN III, IV, VI PERRLA and EOMs intact bilaterally  CN V Intact sensation to sharp and light touch to the face  CN VII facial movements symmetric  CN VIII not tested  CN IX, X no uvula deviation, symmetric rise of  soft palate  CN XI symmetric SCM and trapezius strength bilaterally  CN XII Midline tongue protrusion, symmetric L/R movements     Psychiatric:        Mood and Affect: Mood normal.     ED Results / Procedures / Treatments   Labs (all labs ordered are listed, but only abnormal results are displayed) Labs Reviewed - No data to display  EKG None  Radiology DG Shoulder Right  Result Date: 09/21/2023 CLINICAL DATA:  Right shoulder pain after fall yesterday. EXAM: RIGHT SHOULDER - 2+ VIEW COMPARISON:  None Available. FINDINGS: Status post surgical internal fixation of old proximal humeral fracture. No acute fracture or dislocation is noted. IMPRESSION: No acute abnormality seen. Electronically Signed   By: Lupita Raider M.D.   On:  09/21/2023 13:09   DG Ribs Unilateral W/Chest Right  Result Date: 09/21/2023 CLINICAL DATA:  Right rib pain after fall yesterday. EXAM: RIGHT RIBS AND CHEST - 3+ VIEW COMPARISON:  July 11, 2020. FINDINGS: Old right rib fractures are noted. No acute fracture is noted. There is no evidence of pneumothorax or pleural effusion. Both lungs are clear. Heart size and mediastinal contours are within normal limits. IMPRESSION: Multiple old right rib fractures are noted. No acute right rib fracture is noted. Electronically Signed   By: Lupita Raider M.D.   On: 09/21/2023 13:08   DG Hip Unilat W or Wo Pelvis 2-3 Views Right  Result Date: 09/21/2023 CLINICAL DATA:  Right hip pain after fall yesterday. EXAM: DG HIP (WITH OR WITHOUT PELVIS) 2-3V RIGHT COMPARISON:  September 20, 2022. FINDINGS: There is no evidence of hip fracture or dislocation. Mild osteophyte formation is seen involving the right hip. IMPRESSION: Mild degenerative joint disease of right hip. No acute abnormality seen. Electronically Signed   By: Lupita Raider M.D.   On: 09/21/2023 13:06   CT Head Wo Contrast  Result Date: 09/21/2023 CLINICAL DATA:  Head trauma, minor EXAM: CT HEAD WITHOUT  CONTRAST TECHNIQUE: Contiguous axial images were obtained from the base of the skull through the vertex without intravenous contrast. RADIATION DOSE REDUCTION: This exam was performed according to the departmental dose-optimization program which includes automated exposure control, adjustment of the mA and/or kV according to patient size and/or use of iterative reconstruction technique. COMPARISON:  None Available. FINDINGS: Brain: No evidence of acute infarction, hemorrhage, hydrocephalus, extra-axial collection or mass lesion/mass effect. 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 Vascular: No hyperdense vessel or unexpected calcification. Skull: Normal. Negative for fracture or focal lesion. Sinuses/Orbits: No acute finding. IMPRESSION: No evidence of intracranial injury. Electronically Signed   By: Tiburcio Pea M.D.   On: 09/21/2023 11:54    Procedures Procedures    Medications Ordered in ED Medications - No data to display  ED Course/ Medical Decision Making/ A&P                                 Medical Decision Making Amount and/or Complexity of Data Reviewed Radiology: ordered.   This patient presents to the ED for concern of fall, this involves an extensive number of treatment options, and is a complaint that carries with it a high risk of complications and morbidity.  The differential diagnosis includes CVA, fracture, strain/pain, dislocation, ligamentous/tendinous injury, neurovascular compromise, solid organ damage, pneumothorax, other    Co morbidities that complicate the patient evaluation  See HPI   Additional history obtained:  Additional history obtained from EMR External records from outside source obtained and reviewed including hospital records   Lab Tests:  N/a   Imaging Studies ordered:  I ordered imaging studies including CT head, pelvis x-ray with right hip, right rib x-ray with chest, right shoulder x-ray I independently visualized and  interpreted imaging which showed  CT head: No acute intracranial abnormality. Chest x-ray with right ribs: Old rib fracture.  No acute abnormality. Right shoulder x-ray: Prior surgical hardware.  No acute abnormality. Pelvis with right hip: No acute osseous abnormality. I agree with the radiologist interpretation   Cardiac Monitoring: / EKG:  The patient was maintained on a cardiac monitor.  I personally viewed and interpreted the cardiac monitored which showed an underlying rhythm of: Sinus rhythm   Consultations Obtained:  N/a   Problem List / ED Course / Critical interventions / Medication management  Fall Reevaluation of the patient showed that the patient stayed the same I have reviewed the patients home medicines and have made adjustments as needed   Social Determinants of Health:  Denies tobacco, licit drug use   Test / Admission - Considered:  Fall Vitals signs within normal range and stable throughout visit. Imaging studies significant for: See above 72 year old female presents emergency department after mechanical fall.  Fall occurred last night after AAA transition to strip.  Patient was just complaints of right-sided hip pain, right shoulder pain, right chest pain as well as with trauma to head.  On exam, patient with hematoma appreciated right hip with underlying tenderness to palpation as well as reproducible tenderness right distal clavicle, right humerus.  Patient was with trauma to the right side of her head but without LOC, anticoagulation use, nausea, vomiting.  CT imaging negative for any acute traumatic injury.  Additionally, x-ray imaging negative for any acute osseous abnormality from fall.  Patient able to ambulate without any difficulty/assistance.  Patient reassured by workup.  Will recommend follow-up with primary care in the outpatient setting for reassessment.  Treatment plan discussed with patient and she acknowledged understanding was agreeable to  said plan.  Patient overall well-appearing, afebrile in no acute distress. Worrisome signs and symptoms were discussed with the patient, and the patient acknowledged understanding to return to the ED if noticed. Patient was stable upon discharge.          Final Clinical Impression(s) / ED Diagnoses Final diagnoses:  Fall, initial encounter    Rx / DC Orders ED Discharge Orders     None         Peter Garter, [place] 09/21/23 1530    Ernie Avena, MD 09/27/23 1325

## 2023-09-21 NOTE — ED Triage Notes (Signed)
Pt caox4, ambulatory reporting mechanical fall yesterday evening causing her to fall on her R side c/o pain in R shoulder, R hip, and around R clavicle. Pt denies hitting her head, denies LOC. Pt does not take blood thinner medication. Last had tylenol at 0830 today.

## 2023-09-21 NOTE — ED Notes (Signed)
Pt transported to imaging.

## 2023-09-21 NOTE — Discharge Instructions (Signed)
As discussed, workup today overall reassuring.  X-rays of your shoulder, pelvis, right hip, chest did not show obvious fracture, dislocation or other abnormality.  Recommend continue use of Tylenol at home for pain.  Recommend follow-up with orthopedics regarding her right shoulder pain as there is some concern for rotator cuff injury.  Please do not hesitate to return to emergency department for worrisome signs and symptoms we discussed become apparent.

## 2023-09-26 ENCOUNTER — Ambulatory Visit: Payer: Medicare HMO | Admitting: Physical Therapy

## 2023-09-26 DIAGNOSIS — R293 Abnormal posture: Secondary | ICD-10-CM

## 2023-09-26 DIAGNOSIS — M6281 Muscle weakness (generalized): Secondary | ICD-10-CM

## 2023-09-26 DIAGNOSIS — M5459 Other low back pain: Secondary | ICD-10-CM

## 2023-09-26 DIAGNOSIS — R262 Difficulty in walking, not elsewhere classified: Secondary | ICD-10-CM

## 2023-09-26 NOTE — Therapy (Signed)
[facility] [address] [place], [place], [address] Phone: [phone]   Fax:  [phone]  Patient Details  Name: Vanessa French MRN: [id number] Date of Birth: [birth year] Referring Provider:  Persons, [place], [place]  Encounter Date: 09/26/2023  Pt arriving today reporting fall on 09/21/23 where she fell on her Rt shoulder. Pt was seen at ER where images were taken with no evidence of fractures, however pt presenting today with pain and bruising and protrusion of her Rt clavicle. Pt is scheduled to see an orthopedist on Wednesday. We are holding PT services today. Awaiting further evaluation by orthopedic provider.  No charges submitted for this visit.    Sharmon Leyden, PT, MPT 09/26/2023, 11:22 AM  [facility] Therapy [facility] [address] [place], [place], [address] Phone: [phone]   Fax:  [phone]

## 2023-09-28 ENCOUNTER — Ambulatory Visit: Payer: Medicare HMO | Admitting: Physician Assistant

## 2023-09-28 ENCOUNTER — Encounter: Payer: Self-pay | Admitting: Physician Assistant

## 2023-09-28 DIAGNOSIS — M25511 Pain in right shoulder: Secondary | ICD-10-CM

## 2023-09-28 NOTE — Progress Notes (Signed)
Office Visit Note   Patient: Vanessa French           Date of Birth: 1951/07/07           MRN: [id number] Visit Date: 09/28/2023              Requested by: Lucky Cowboy, MD [address] [place],  [place] [address] PCP: Lucky Cowboy, MD  Chief Complaint  Patient presents with   Right Shoulder - Pain      HPI: Pami is a pleasant 72 year old woman who is followed by myself in the osteoporosis clinic.  She comes in today for a different reason.  She had a fall approximately 6 days ago onto her right side.  She was thoroughly evaluated with x-rays in the emergency room including her hips shoulder on the right and ribs.  She does have a previous proximal humerus fracture.  She did have significant ecchymosis on her right side also have some prominence at the sternoclavicular joint.  She has no shortness of breath no difficulty swallowing.  She is due to continue with physical therapy tomorrow wants to be cleared  Assessment & Plan: Visit Diagnoses: Fall  Plan: She does have some rotator cuff symptoms but her strength is good just forward elevation is somewhat painful and stiff.  Gave her instruction on doing wall climbing exercises with her hands as well as pendulums she is going to do this would like to follow-up in 2 weeks.  Follow-Up Instructions: 2 weeks  Ortho Exam  Patient is alert, oriented, no adenopathy, well-dressed, normal affect, normal respiratory effort. She has significant ecchymosis over her right buttock and right thigh.  She has good range of motion of her hip with the right shoulder she is neurovascularly intact she is able to abduct internally rotate behind her back does have some pain with forward elevation and positive empty can test.  Strength is intact.  Grip strength is intact.  Imaging: No results found. No images are attached to the encounter.  Labs: Lab Results  Component Value Date   HGBA1C 5.9 (H) 08/22/2023   HGBA1C 5.6  08/11/2022   HGBA1C 5.3 11/17/2021   ESRSEDRATE 6 06/30/2017   LABURIC 5.6 06/30/2017   LABORGA  01/10/2017    Multiple organisms present,each less than 10,000 CFU/mL. These organisms,commonly found on external and internal genitalia,are considered colonizers. No further testing performed.      Lab Results  Component Value Date   ALBUMIN 4.1 11/30/2016   ALBUMIN 4.0 08/13/2016   ALBUMIN 3.9 05/17/2016    Lab Results  Component Value Date   MG 1.9 08/22/2023   MG 2.1 02/17/2023   MG 2.1 08/11/2022   Lab Results  Component Value Date   VD25OH 71 08/22/2023   VD25OH 74 08/11/2022   VD25OH 83 02/16/2022    No results found for: "PREALBUMIN"    Latest Ref Rng & Units 08/22/2023   10:58 AM 02/17/2023   11:49 AM 08/11/2022   12:00 AM  CBC EXTENDED  WBC 3.8 - 10.8 Thousand/uL 7.2  7.8  6.5   RBC 3.80 - 5.10 Million/uL 5.14  5.17  5.38   Hemoglobin 11.7 - 15.5 g/dL 19.1  47.8  29.5   HCT 35.0 - 45.0 % 47.1  46.4  48.0   Platelets 140 - 400 Thousand/uL 242  218  201   NEUT# 1,500 - 7,800 cells/uL 4,810  4,828  4,128   Lymph# 850 - 3,900 cells/uL  2,051  1,645      There is no height or weight on file to calculate BMI.  Orders:  No orders of the defined types were placed in this encounter.  No orders of the defined types were placed in this encounter.    Procedures: No procedures performed  Clinical Data: No additional findings.  ROS:  All other systems negative, except as noted in the HPI. Review of Systems  Objective: Vital Signs: There were no vitals taken for this visit.  Specialty Comments:  No specialty comments available.  PMFS History: Patient Active Problem List   Diagnosis Date Noted   Age-related osteoporosis without current pathological fracture 09/06/2023   Lumbar compression fracture (HCC) 07/12/2023   Macular degeneration of both eyes 08/11/2021   Aortic atherosclerosis (HCC)- CT 04/2021 05/14/2021   B12 deficiency 08/07/2020    Thoracic compression fracture (HCC) 07/30/2020   Essential hypertension 12/21/2018   GERD (gastroesophageal reflux disease) 07/13/2018   Psoriasis 12/15/2017   Medication management 12/14/2017   Osteopenia of multiple sites 12/14/2017   BMI 28.0-28.9,adult 12/14/2017   Hyperlipidemia 12/14/2017   Vitamin D deficiency 12/14/2017   OAB (overactive bladder) 11/30/2016   History of prediabetes 11/30/2016   Anxiety 11/03/2012   Past Medical History:  Diagnosis Date   Acute pulmonary embolism [facility]) Post Covid - July 2022 05/14/2021   Closed fracture of right proximal humerus 04/07/2012   Diverticulosis of colon 02/14/2020   Frequent UTI    Ganglion cyst of wrist, left 08/11/2021   GERD (gastroesophageal reflux disease)    History of pulmonary embolus (PE) 11/03/2012   Hypertension    Psoriasis    Thoracic compression fracture (HCC)     Family History  Problem Relation Age of Onset   Heart disease Mother    Colon cancer Father 94   Atrial fibrillation Sister    Heart disease Maternal Grandfather    Heart failure Maternal Grandfather 86   Kidney Stones Son    Colon cancer Maternal Uncle     Past Surgical History:  Procedure Laterality Date   APPENDECTOMY  1959   BLADDER SUSPENSION  11/1998   BREAST CYST ASPIRATION Right 2011   CATARACT EXTRACTION, BILATERAL Bilateral 2016   Dr. Josph Macho REPAIR  11/10/2018   Dr. Marcelyn Bruins, [facility]   CYSTOCELE REPAIR  11/10/2018   Dr. Marcelyn Bruins   EXTENSOR TENDON OF FOREARM / WRIST REPAIR Left 2010   LEFT ARM   GANGLION CYST EXCISION Left 09/29/2021   Procedure: EXCISION VOLAR CYST LEFT WRIST RECURRENT;  Surgeon: Cindee Salt, MD;  Location: [facility];  Service: Orthopedics;  Laterality: Left;  regional with MAC   ORIF HUMERUS FRACTURE  04/07/2012   Procedure: OPEN REDUCTION INTERNAL FIXATION (ORIF) PROXIMAL HUMERUS FRACTURE;  Surgeon: Eldred Manges, MD;  Location: [facility] OR;  Service: Orthopedics;  Laterality: Right;   Open Reduction Internal Fixation Right Proxmial Humerus   STERIOD INJECTION  04/07/2012   Procedure: STEROID INJECTION;  Surgeon: Eldred Manges, MD;  Location: [facility] OR;  Service: Orthopedics;  Laterality: Right;   Right 1st Dorsal Compartment Injection   TONSILLECTOMY  1962   URETER REVISION Left 11/3003   COMPLIATION OF BLADDER SLING   vaginal hysterectomy with unilateral salpingooopherectomy  2000   Social History   Occupational History   Occupation: Teacher, adult education: [employer]  Tobacco Use   Smoking status: Never   Smokeless tobacco: Never  Vaping Use   Vaping status: Never Used  Substance and Sexual Activity   Alcohol use: No   Drug use: No   Sexual activity: Yes    Partners: Male    Birth control/protection: Post-menopausal

## 2023-09-29 ENCOUNTER — Encounter: Payer: Self-pay | Admitting: Physical Therapy

## 2023-09-29 ENCOUNTER — Encounter: Payer: Medicare HMO | Admitting: Rehabilitative and Restorative Service Providers"

## 2023-09-29 ENCOUNTER — Ambulatory Visit: Payer: Medicare HMO | Admitting: Physical Therapy

## 2023-09-29 DIAGNOSIS — R293 Abnormal posture: Secondary | ICD-10-CM | POA: Diagnosis not present

## 2023-09-29 DIAGNOSIS — M5459 Other low back pain: Secondary | ICD-10-CM | POA: Diagnosis not present

## 2023-09-29 DIAGNOSIS — M6281 Muscle weakness (generalized): Secondary | ICD-10-CM | POA: Diagnosis not present

## 2023-09-29 DIAGNOSIS — R262 Difficulty in walking, not elsewhere classified: Secondary | ICD-10-CM
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
## 2023-10-03 ENCOUNTER — Ambulatory Visit: Payer: Medicare HMO | Admitting: Physical Therapy

## 2023-10-03 ENCOUNTER — Encounter: Payer: Self-pay | Admitting: Physical Therapy

## 2023-10-03 DIAGNOSIS — M5459 Other low back pain: Secondary | ICD-10-CM

## 2023-10-03 DIAGNOSIS — R262 Difficulty in walking, not elsewhere classified: Secondary | ICD-10-CM | POA: Diagnosis not present

## 2023-10-03 DIAGNOSIS — R293 Abnormal posture: Secondary | ICD-10-CM | POA: Diagnosis not present

## 2023-10-03 DIAGNOSIS — M6281 Muscle weakness (generalized): Secondary | ICD-10-CM
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
## 2023-10-05 ENCOUNTER — Encounter: Payer: Self-pay | Admitting: Physical Therapy

## 2023-10-05 ENCOUNTER — Ambulatory Visit: Payer: Medicare HMO | Admitting: Physical Therapy

## 2023-10-05 DIAGNOSIS — M5459 Other low back pain: Secondary | ICD-10-CM | POA: Diagnosis not present

## 2023-10-05 DIAGNOSIS — M6281 Muscle weakness (generalized): Secondary | ICD-10-CM | POA: Diagnosis not present

## 2023-10-05 DIAGNOSIS — R293 Abnormal posture: Secondary | ICD-10-CM | POA: Diagnosis not present

## 2023-10-05 DIAGNOSIS — R262 Difficulty in walking, not elsewhere classified: Secondary | ICD-10-CM
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
## 2023-10-10 ENCOUNTER — Ambulatory Visit: Payer: Medicare HMO | Admitting: Physical Therapy

## 2023-10-10 ENCOUNTER — Encounter: Payer: Self-pay | Admitting: Physical Therapy

## 2023-10-10 DIAGNOSIS — M5459 Other low back pain: Secondary | ICD-10-CM

## 2023-10-10 DIAGNOSIS — R293 Abnormal posture: Secondary | ICD-10-CM

## 2023-10-10 DIAGNOSIS — R262 Difficulty in walking, not elsewhere classified: Secondary | ICD-10-CM | POA: Diagnosis not present

## 2023-10-10 DIAGNOSIS — M6281 Muscle weakness (generalized): Secondary | ICD-10-CM
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
## 2023-10-12 ENCOUNTER — Ambulatory Visit: Payer: Medicare HMO | Admitting: Physical Therapy

## 2023-10-12 ENCOUNTER — Encounter: Payer: Self-pay | Admitting: Physical Therapy

## 2023-10-12 ENCOUNTER — Encounter: Payer: Self-pay | Admitting: Physician Assistant

## 2023-10-12 ENCOUNTER — Ambulatory Visit: Payer: Medicare HMO | Admitting: Physician Assistant

## 2023-10-12 DIAGNOSIS — M5459 Other low back pain: Secondary | ICD-10-CM | POA: Diagnosis not present

## 2023-10-12 DIAGNOSIS — R262 Difficulty in walking, not elsewhere classified: Secondary | ICD-10-CM | POA: Diagnosis not present

## 2023-10-12 DIAGNOSIS — M8589 Other specified disorders of bone density and structure, multiple sites: Secondary | ICD-10-CM | POA: Diagnosis not present

## 2023-10-12 DIAGNOSIS — R293 Abnormal posture: Secondary | ICD-10-CM

## 2023-10-12 DIAGNOSIS — M6281 Muscle weakness (generalized): Secondary | ICD-10-CM
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
## 2023-10-12 NOTE — Progress Notes (Signed)
Office Visit Note   Patient: Vanessa French           Date of Birth: 25-Mar-1951           MRN: [id number] Visit Date: 10/12/2023              Requested by: Lucky Cowboy, MD [address] [place],  [place] [address] PCP: Lucky Cowboy, MD  Chief Complaint  Patient presents with   Right Shoulder - Follow-up      HPI: Patient returns in follow-up for her right shoulder and her fall and contusions.  She has been doing therapy and is feeling much better.  She still has some prominence at this no clavicular joint though it is not painful.  She also still has some hematoma and swelling on her right buttock.  Denies any fever chills.  She is also here for follow-up on osteoporosis.  She was denied Evenity.  Assessment & Plan: Visit Diagnoses: Status post fall.  History of osteoporosis with poor response to Fosamax as had multiple compression fractures.  Plan: She will continue with therapy I will follow-up again with her in 3 weeks sees how she is feeling.  I would like to at least try and authorize her for Prolia.  Given her history of Fosamax use and having fractures despite being on Fosamax I am concerned that she will continue to have fractures and high FRAX scores.  Follow-Up Instructions: 3 weeks  Ortho Exam  Patient is alert, oriented, no adenopathy, well-dressed, normal affect, normal respiratory effort. Examination of her right shoulder she has good forward elevation though still painful she has good internal rotation behind the back strength is intact.  On her right buttock she has resolving hematoma minimal pain she is neurovascularly intact  Imaging: No results found. No images are attached to the encounter.  Labs: Lab Results  Component Value Date   HGBA1C 5.9 (H) 08/22/2023   HGBA1C 5.6 08/11/2022   HGBA1C 5.3 11/17/2021   ESRSEDRATE 6 06/30/2017   LABURIC 5.6 06/30/2017   LABORGA  01/10/2017    Multiple organisms present,each less  than 10,000 CFU/mL. These organisms,commonly found on external and internal genitalia,are considered colonizers. No further testing performed.      Lab Results  Component Value Date   ALBUMIN 4.1 11/30/2016   ALBUMIN 4.0 08/13/2016   ALBUMIN 3.9 05/17/2016    Lab Results  Component Value Date   MG 1.9 08/22/2023   MG 2.1 02/17/2023   MG 2.1 08/11/2022   Lab Results  Component Value Date   VD25OH 71 08/22/2023   VD25OH 74 08/11/2022   VD25OH 83 02/16/2022    No results found for: "PREALBUMIN"    Latest Ref Rng & Units 08/22/2023   10:58 AM 02/17/2023   11:49 AM 08/11/2022   12:00 AM  CBC EXTENDED  WBC 3.8 - 10.8 Thousand/uL 7.2  7.8  6.5   RBC 3.80 - 5.10 Million/uL 5.14  5.17  5.38   Hemoglobin 11.7 - 15.5 g/dL 78.4  69.6  29.5   HCT 35.0 - 45.0 % 47.1  46.4  48.0   Platelets 140 - 400 Thousand/uL 242  218  201   NEUT# 1,500 - 7,800 cells/uL 4,810  4,828  4,128   Lymph# 850 - 3,900 cells/uL  2,051  1,645      There is no height or weight on file to calculate BMI.  Orders:  No orders of the defined types were placed in this  encounter.  No orders of the defined types were placed in this encounter.    Procedures: No procedures performed  Clinical Data: No additional findings.  ROS:  All other systems negative, except as noted in the HPI. Review of Systems  Objective: Vital Signs: There were no vitals taken for this visit.  Specialty Comments:  No specialty comments available.  PMFS History: Patient Active Problem List   Diagnosis Date Noted   Age-related osteoporosis without current pathological fracture 09/06/2023   Lumbar compression fracture (HCC) 07/12/2023   Macular degeneration of both eyes 08/11/2021   Aortic atherosclerosis (HCC)- CT 04/2021 05/14/2021   B12 deficiency 08/07/2020   Thoracic compression fracture (HCC) 07/30/2020   Essential hypertension 12/21/2018   GERD (gastroesophageal reflux disease) 07/13/2018   Psoriasis  12/15/2017   Medication management 12/14/2017   Osteopenia of multiple sites 12/14/2017   BMI 28.0-28.9,adult 12/14/2017   Hyperlipidemia 12/14/2017   Vitamin D deficiency 12/14/2017   OAB (overactive bladder) 11/30/2016   History of prediabetes 11/30/2016   Anxiety 11/03/2012   Past Medical History:  Diagnosis Date   Acute pulmonary embolism [facility]) Post Covid - July 2022 05/14/2021   Closed fracture of right proximal humerus 04/07/2012   Diverticulosis of colon 02/14/2020   Frequent UTI    Ganglion cyst of wrist, left 08/11/2021   GERD (gastroesophageal reflux disease)    History of pulmonary embolus (PE) 11/03/2012   Hypertension    Psoriasis    Thoracic compression fracture (HCC)     Family History  Problem Relation Age of Onset   Heart disease Mother    Colon cancer Father 43   Atrial fibrillation Sister    Heart disease Maternal Grandfather    Heart failure Maternal Grandfather 22   Kidney Stones Son    Colon cancer Maternal Uncle     Past Surgical History:  Procedure Laterality Date   APPENDECTOMY  1959   BLADDER SUSPENSION  11/1998   BREAST CYST ASPIRATION Right 2011   CATARACT EXTRACTION, BILATERAL Bilateral 2016   Dr. Josph Macho REPAIR  11/10/2018   Dr[facility] Marcelyn [facility]ins, [facility]   CYSTOCELE REPAIR  11/10/2018   Dr. Marcelyn Bruins   EXTENSOR TENDON OF FOREARM / WRIST REPAIR Left 2010   LEFT ARM   GANGLION CYST EXCISION Left 09/29/2021   Procedure: EXCISION VOLAR CYST LEFT WRIST RECURRENT;  Surgeon: Cindee Salt, MD;  Location:  SURGERY CENTER;  Service: Orthopedics;  Laterality: Left;  regional with MAC   ORIF HUMERUS FRACTURE  04/07/2012   Procedure: OPEN REDUCTION INTERNAL FIXATION (ORIF) PROXIMAL HUMERUS FRACTURE;  Surgeon: Eldred Manges, MD;  Location: [facility] OR;  Service: Orthopedics;  Laterality: Right;  Open Reduction Internal Fixation Right Proxmial Humerus   STERIOD INJECTION  04/07/2012   Procedure: STEROID INJECTION;  Surgeon: Eldred Manges, MD;   Location: [facility] OR;  Service: Orthopedics;  Laterality: Right;   Right 1st Dorsal Compartment Injection   TONSILLECTOMY  1962   URETER REVISION Left 11/3003   COMPLIATION OF BLADDER SLING   vaginal hysterectomy with unilateral salpingooopherectomy  2000   Social History   Occupational History   Occupation: Teacher, adult education: [employer]  Tobacco Use   Smoking status: Never   Smokeless tobacco: Never  Vaping Use   Vaping status: Never Used  Substance and Sexual Activity   Alcohol use: No   Drug use: No   Sexual activity: Yes    Partners: Male    Birth control/protection: Post-menopausal

## 2023-10-13 ENCOUNTER — Telehealth: Payer: Self-pay

## 2023-10-13 NOTE — Telephone Encounter (Signed)
Submitted for [employer] through [employer]

## 2023-10-13 NOTE — Telephone Encounter (Signed)
-----   Message from [place] W sent at 10/12/2023  1:21 PM EST ----- Regarding: Prolia Hi Vanessa French,  Could you put in a request for authorization for this patient for Prolia?  Thank you, Isabelle Course

## 2023-10-17 ENCOUNTER — Other Ambulatory Visit: Payer: Self-pay | Admitting: Internal Medicine

## 2023-10-17 ENCOUNTER — Encounter: Payer: Self-pay | Admitting: Physical Therapy

## 2023-10-17 ENCOUNTER — Ambulatory Visit: Payer: Medicare HMO | Admitting: Physical Therapy

## 2023-10-17 DIAGNOSIS — R293 Abnormal posture: Secondary | ICD-10-CM

## 2023-10-17 DIAGNOSIS — R262 Difficulty in walking, not elsewhere classified: Secondary | ICD-10-CM | POA: Diagnosis not present

## 2023-10-17 DIAGNOSIS — M6281 Muscle weakness (generalized): Secondary | ICD-10-CM

## 2023-10-17 DIAGNOSIS — M5459 Other low back pain: Secondary | ICD-10-CM

## 2023-10-17 DIAGNOSIS — Z1231 Encounter for screening mammogram for malignant neoplasm of breast: Secondary | ICD-10-CM
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
## 2023-10-20 ENCOUNTER — Encounter: Payer: Self-pay | Admitting: Physical Therapy

## 2023-10-20 ENCOUNTER — Ambulatory Visit: Payer: Medicare HMO | Admitting: Physical Therapy

## 2023-10-20 DIAGNOSIS — R262 Difficulty in walking, not elsewhere classified: Secondary | ICD-10-CM | POA: Diagnosis not present

## 2023-10-20 DIAGNOSIS — M5459 Other low back pain: Secondary | ICD-10-CM | POA: Diagnosis not present

## 2023-10-20 DIAGNOSIS — R293 Abnormal posture: Secondary | ICD-10-CM | POA: Diagnosis not present

## 2023-10-20 DIAGNOSIS — M6281 Muscle weakness (generalized): Secondary | ICD-10-CM
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
## 2023-10-24 ENCOUNTER — Encounter: Payer: Medicare HMO | Admitting: Physical Therapy

## 2023-10-24 DIAGNOSIS — H353231 Exudative age-related macular degeneration, bilateral, with active choroidal neovascularization: Secondary | ICD-10-CM | POA: Diagnosis not present

## 2023-10-28 ENCOUNTER — Encounter: Payer: Medicare HMO | Admitting: Rehabilitative and Restorative Service Providers"

## 2023-11-02 ENCOUNTER — Ambulatory Visit: Payer: Medicare HMO | Admitting: Physician Assistant

## 2023-11-02 ENCOUNTER — Encounter: Payer: Self-pay | Admitting: Physician Assistant

## 2023-11-02 DIAGNOSIS — M25511 Pain in right shoulder: Secondary | ICD-10-CM | POA: Diagnosis not present

## 2023-11-02 NOTE — Progress Notes (Signed)
 Office Visit Note   Patient: Vanessa [facility]           Date of Birth: [birth year]           MRN: [id number] Visit Date: 11/02/2023              Requested by: Tonita Fallow, MD [address] [place],  [place] [address] PCP: Tonita Fallow, MD  Chief Complaint  Patient presents with   Right Shoulder - Follow-up      HPI: Patient is a pleasant 73 year old woman who is in follow-up today for her right shoulder.  She had a history of an injury and had no fracture but did have quite a bit of rotator cuff tendinitis.  She has been working with physical therapy and was recently discharged from PT.  She is doing much better. She also is a patient of mine in the osteoporosis clinic.  She now thinks her Prolia  has moved to a preferred medication.  She has failed treatment with Fosamax   Assessment & Plan: Visit Diagnoses: Right shoulder pain  Plan: She will continue to do exercises on her own May follow-up for her shoulder as needed will be sure authorization hopefully for Prolia  in the near future  Follow-Up Instructions: Return if symptoms worsen or fail to improve.   Ortho Exam  Patient is alert, oriented, no adenopathy, well-dressed, normal affect, normal respiratory effort. Examination of the right shoulder she has full forward elevation a little bit of aching but good strength with abduction external rotation.  She has good internal rotation behind her back again with a little bit of aching.  She has a negative empty can test  Imaging: No results found. No images are attached to the encounter.  Labs: Lab Results  Component Value Date   HGBA1C 5.9 (H) 08/22/2023   HGBA1C 5.6 08/11/2022   HGBA1C 5.3 11/17/2021   ESRSEDRATE 6 06/30/2017   LABURIC 5.6 06/30/2017   LABORGA  01/10/2017    Multiple organisms present,each less than 10,000 CFU/mL. These organisms,commonly found on external and internal genitalia,are considered colonizers. No further testing  performed.      Lab Results  Component Value Date   ALBUMIN 4.1 11/30/2016   ALBUMIN 4.0 08/13/2016   ALBUMIN 3.9 05/17/2016    Lab Results  Component Value Date   MG 1.9 08/22/2023   MG 2.1 02/17/2023   MG 2.1 08/11/2022   Lab Results  Component Value Date   VD25OH 71 08/22/2023   VD25OH 74 08/11/2022   VD25OH 83 02/16/2022    No results found for: PREALBUMIN    Latest Ref Rng & Units 08/22/2023   10:58 AM 02/17/2023   11:49 AM 08/11/2022   12:00 AM  CBC EXTENDED  WBC 3.8 - 10.8 Thousand/uL 7.2  7.8  6.5   RBC 3.80 - 5.10 Million/uL 5.14  5.17  5.38   Hemoglobin 11.7 - 15.5 g/dL 84.2  84.4  83.7   HCT 35.0 - 45.0 % 47.1  46.4  48.0   Platelets 140 - 400 Thousand/uL 242  218  201   NEUT# 1,500 - 7,800 cells/uL 4,810  4,828  4,128   Lymph# 850 - 3,900 cells/uL  2,051  1,645      There is no height or weight on file to calculate BMI.  Orders:  No orders of the defined types were placed in this encounter.  No orders of the defined types were placed in this encounter.    Procedures: No  procedures performed  Clinical Data: No additional findings.  ROS:  All other systems negative, except as noted in the HPI. Review of Systems  Objective: Vital Signs: There were no vitals taken for this visit.  Specialty Comments:  No specialty comments available.  PMFS History: Patient Active Problem List   Diagnosis Date Noted   Age-related osteoporosis without current pathological fracture 09/06/2023   Lumbar compression fracture (HCC) 07/12/2023   Macular degeneration of both eyes 08/11/2021   Aortic atherosclerosis (HCC)- CT 04/2021 05/14/2021   B12 deficiency 08/07/2020   Thoracic compression fracture (HCC) 07/30/2020   Essential hypertension 12/21/2018   GERD (gastroesophageal reflux disease) 07/13/2018   Psoriasis 12/15/2017   Medication management 12/14/2017   Osteopenia of multiple sites 12/14/2017   BMI 28.0-28.9,adult 12/14/2017   Hyperlipidemia  12/14/2017   Vitamin D  deficiency 12/14/2017   OAB (overactive bladder) 11/30/2016   History of prediabetes 11/30/2016   Anxiety 11/03/2012   Past Medical History:  Diagnosis Date   Acute pulmonary embolism [facility]) Post Covid - July 2022 05/14/2021   Closed fracture of right proximal humerus 04/07/2012   Diverticulosis of colon 02/14/2020   Frequent UTI    Ganglion cyst of wrist, left 08/11/2021   GERD (gastroesophageal reflux disease)    History of pulmonary embolus (PE) 11/03/2012   Hypertension    Psoriasis    Thoracic compression fracture (HCC)     Family History  Problem Relation Age of Onset   Heart disease Mother    Colon cancer Father 61   Atrial fibrillation Sister    Heart disease Maternal Grandfather    Heart failure Maternal Grandfather 24   Kidney Stones Son    Colon cancer Maternal Uncle     Past Surgical History:  Procedure Laterality Date   APPENDECTOMY  1959   BLADDER SUSPENSION  11/1998   BREAST CYST ASPIRATION Right 2011   CATARACT EXTRACTION, BILATERAL Bilateral 2016   Dr. Charmayne MASH REPAIR  11/10/2018   Dr. Lamar [facility]   CYSTOCELE REPAIR  11/10/2018   Dr. Lamar Sar   EXTENSOR TENDON OF FOREARM / WRIST REPAIR Left 2010   LEFT ARM   GANGLION CYST EXCISION Left 09/29/2021   Procedure: EXCISION VOLAR CYST LEFT WRIST RECURRENT;  Surgeon: Murrell Kuba, MD;  Location: [facility];  Service: Orthopedics;  Laterality: Left;  regional with MAC   ORIF HUMERUS FRACTURE  04/07/2012   Procedure: OPEN REDUCTION INTERNAL FIXATION (ORIF) PROXIMAL HUMERUS FRACTURE;  Surgeon: Oneil JAYSON Herald, MD;  Location: [facility];  Service: Orthopedics;  Laterality: Right;  Open Reduction Internal Fixation Right Proxmial Humerus   STERIOD INJECTION  04/07/2012   Procedure: STEROID INJECTION;  Surgeon: Oneil JAYSON Herald, MD;  Location: [facility];  Service: Orthopedics;  Laterality: Right;   Right 1st Dorsal Compartment Injection   TONSILLECTOMY  1962   URETER REVISION  Left 11/3003   COMPLIATION OF BLADDER SLING   vaginal hysterectomy with unilateral salpingooopherectomy  2000   Social History   Occupational History   Occupation: Teacher, Adult Education: [employer]  Tobacco Use   Smoking status: Never   Smokeless tobacco: Never  Vaping Use   Vaping status: Never Used  Substance and Sexual Activity   Alcohol use: No   Drug use: No   Sexual activity: Yes    Partners: Male    Birth control/protection: Post-menopausal

## 2023-11-04 ENCOUNTER — Encounter: Payer: Medicare HMO | Admitting: Physical Therapy

## 2023-11-17 DIAGNOSIS — H26491 Other secondary cataract, right eye: Secondary | ICD-10-CM | POA: Diagnosis not present

## 2023-11-17 DIAGNOSIS — H26492 Other secondary cataract, left eye: Secondary | ICD-10-CM | POA: Diagnosis not present
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
## 2023-11-22 ENCOUNTER — Other Ambulatory Visit: Payer: Self-pay

## 2023-11-22 ENCOUNTER — Ambulatory Visit (INDEPENDENT_AMBULATORY_CARE_PROVIDER_SITE_OTHER): Payer: Medicare HMO | Admitting: Nurse Practitioner

## 2023-11-22 VITALS — BP 108/70 | HR 119 | Temp 97.9°F | Resp 16 | Ht 62.0 in | Wt 165.0 lb

## 2023-11-22 DIAGNOSIS — Z79899 Other long term (current) drug therapy: Secondary | ICD-10-CM | POA: Diagnosis not present

## 2023-11-22 DIAGNOSIS — L409 Psoriasis, unspecified: Secondary | ICD-10-CM | POA: Diagnosis not present

## 2023-11-22 DIAGNOSIS — E663 Overweight: Secondary | ICD-10-CM | POA: Diagnosis not present

## 2023-11-22 DIAGNOSIS — H3530 Unspecified macular degeneration: Secondary | ICD-10-CM | POA: Diagnosis not present

## 2023-11-22 DIAGNOSIS — R300 Dysuria: Secondary | ICD-10-CM

## 2023-11-22 DIAGNOSIS — M8589 Other specified disorders of bone density and structure, multiple sites: Secondary | ICD-10-CM | POA: Diagnosis not present

## 2023-11-22 DIAGNOSIS — I10 Essential (primary) hypertension: Secondary | ICD-10-CM

## 2023-11-22 DIAGNOSIS — N3281 Overactive bladder: Secondary | ICD-10-CM | POA: Diagnosis not present

## 2023-11-22 DIAGNOSIS — S32010S Wedge compression fracture of first lumbar vertebra, sequela: Secondary | ICD-10-CM

## 2023-11-22 DIAGNOSIS — E559 Vitamin D deficiency, unspecified: Secondary | ICD-10-CM | POA: Diagnosis not present

## 2023-11-22 DIAGNOSIS — F419 Anxiety disorder, unspecified: Secondary | ICD-10-CM

## 2023-11-22 DIAGNOSIS — E782 Mixed hyperlipidemia: Secondary | ICD-10-CM

## 2023-11-22 DIAGNOSIS — R7309 Other abnormal glucose: Secondary | ICD-10-CM | POA: Diagnosis not present

## 2023-11-22 DIAGNOSIS — K219 Gastro-esophageal reflux disease without esophagitis: Secondary | ICD-10-CM | POA: Diagnosis not present

## 2023-11-22 DIAGNOSIS — Z8781 Personal history of (healed) traumatic fracture: Secondary | ICD-10-CM

## 2023-11-22 DIAGNOSIS — E538 Deficiency of other specified B group vitamins: Secondary | ICD-10-CM

## 2023-11-22 DIAGNOSIS — Z86711 Personal history of pulmonary embolism: Secondary | ICD-10-CM

## 2023-11-22 DIAGNOSIS — I700 Atherosclerosis of aorta: Secondary | ICD-10-CM

## 2023-11-22 MED ORDER — ROSUVASTATIN CALCIUM 5 MG PO TABS: ORAL_TABLET | ORAL | 3 refills | Status: AC

## 2023-11-22 MED ORDER — OMEPRAZOLE 40 MG PO CPDR: DELAYED_RELEASE_CAPSULE | ORAL | 3 refills | Status: AC

## 2023-11-23 LAB — COMPLETE METABOLIC PANEL WITH GFR
AG Ratio: 1.4 (calc) (ref 1.0–2.5)
ALT: 13 U/L (ref 6–29)
AST: 17 U/L (ref 10–35)
Albumin: 4.6 g/dL (ref 3.6–5.1)
Alkaline phosphatase (APISO): 68 U/L (ref 37–153)
BUN: 10 mg/dL (ref 7–25)
CO2: 25 mmol/L (ref 20–32)
Calcium: 9.9 mg/dL (ref 8.6–10.4)
Chloride: 95 mmol/L — ABNORMAL LOW (ref 98–110)
Creat: 0.64 mg/dL (ref 0.60–1.00)
Globulin: 3.4 g/dL (ref 1.9–3.7)
Glucose, Bld: 95 mg/dL (ref 65–99)
Potassium: 3.6 mmol/L (ref 3.5–5.3)
Sodium: 137 mmol/L (ref 135–146)
Total Bilirubin: 0.6 mg/dL (ref 0.2–1.2)
Total Protein: 8 g/dL (ref 6.1–8.1)
eGFR: 94 mL/min/{1.73_m2} (ref >=60)

## 2023-11-23 LAB — CBC WITH DIFFERENTIAL/PLATELET
Absolute Lymphocytes: 1896 {cells}/uL (ref 850–3900)
Absolute Monocytes: 593 {cells}/uL (ref 200–950)
Basophils Absolute: 47 {cells}/uL (ref 0–200)
Basophils Relative: 0.6 %
Eosinophils Absolute: 142 {cells}/uL (ref 15–500)
Eosinophils Relative: 1.8 %
HCT: 49.5 % — ABNORMAL HIGH (ref 35.0–45.0)
Hemoglobin: 16.5 g/dL — ABNORMAL HIGH (ref 11.7–15.5)
MCH: 30.5 pg (ref 27.0–33.0)
MCHC: 33.3 g/dL (ref 32.0–36.0)
MCV: 91.5 fL (ref 80.0–100.0)
MPV: 10.3 fL (ref 7.5–12.5)
Monocytes Relative: 7.5 %
Neutro Abs: 5222 {cells}/uL (ref 1500–7800)
Neutrophils Relative %: 66.1 %
Platelets: 217 10*3/uL (ref 140–400)
RBC: 5.41 10*6/uL — ABNORMAL HIGH (ref 3.80–5.10)
RDW: 11.7 % (ref 11.0–15.0)
Total Lymphocyte: 24 %
WBC: 7.9 10*3/uL (ref 3.8–10.8)

## 2023-11-23 LAB — LIPID PANEL
Cholesterol: 162 mg/dL (ref <200)
HDL: 73 mg/dL (ref >=50)
LDL Cholesterol (Calc): 72 mg/dL
Non-HDL Cholesterol (Calc): 89 mg/dL (ref <130)
Total CHOL/HDL Ratio: 2.2 (calc) (ref <5.0)
Triglycerides: 86 mg/dL (ref <150)

## 2023-11-23 LAB — HEMOGLOBIN A1C
Hgb A1c MFr Bld: 5.6 %{Hb} (ref <5.7)
Mean Plasma Glucose: 114 mg/dL
eAG (mmol/L): 6.3 mmol/L

## 2023-11-23 LAB — VITAMIN B12: Vitamin B-12: 1461 pg/mL — ABNORMAL HIGH (ref 200–1100)

## 2023-11-24 ENCOUNTER — Encounter: Payer: Self-pay | Admitting: Nurse Practitioner

## 2023-11-25 ENCOUNTER — Encounter: Payer: Self-pay | Admitting: Nurse Practitioner

## 2023-11-25 ENCOUNTER — Other Ambulatory Visit: Payer: Self-pay | Admitting: Nurse Practitioner

## 2023-11-25 LAB — URINE CULTURE
MICRO NUMBER:: 16008835
SPECIMEN QUALITY:: ADEQUATE

## 2023-11-25 LAB — URINALYSIS, ROUTINE W REFLEX MICROSCOPIC
Bacteria, UA: NONE SEEN /[HPF]
Bilirubin Urine: NEGATIVE
Glucose, UA: NEGATIVE
Nitrite: NEGATIVE
Protein, ur: NEGATIVE
Specific Gravity, Urine: 1.012 (ref 1.001–1.035)
Squamous Epithelial / HPF: NONE SEEN /[HPF] (ref <=5)
WBC, UA: >=60 /[HPF] — AB (ref 0–5)
pH: 6.5 (ref 5.0–8.0)

## 2023-11-25 LAB — MICROSCOPIC MESSAGE

## 2023-11-25 MED ORDER — NITROFURANTOIN MONOHYD MACRO 100 MG PO CAPS: 100.0000 mg | ORAL_CAPSULE | Freq: Two times a day (BID) | ORAL | 0 refills | Status: AC

## 2023-11-27 ENCOUNTER — Encounter: Payer: Self-pay | Admitting: Nurse Practitioner

## 2023-11-27 NOTE — Patient Instructions (Signed)

## 2023-11-30 ENCOUNTER — Telehealth: Payer: Self-pay | Admitting: Physician Assistant

## 2023-11-30 ENCOUNTER — Telehealth: Payer: Self-pay

## 2023-11-30 NOTE — Telephone Encounter (Signed)
 Patient called. Would like to know if she will get an injection? Says she hasn't heard anything.

## 2023-11-30 NOTE — Telephone Encounter (Signed)
 Patient would like a call back concerning Fosamax .  Cb# [phone].  Please advise.  Thank you.

## 2023-11-30 NOTE — Telephone Encounter (Signed)
 Talked with patient concerning Prolia.  Advised patient of pricing and authorization that is needed through her insurance and once approved, I will call her to schedule.  Patient voiced that she understands.

## 2023-12-01 ENCOUNTER — Encounter: Payer: Self-pay | Admitting: Nurse Practitioner

## 2023-12-01 MED ORDER — MECLIZINE HCL 25 MG PO TABS: ORAL_TABLET | ORAL | 0 refills | Status: AC

## 2023-12-05 ENCOUNTER — Ambulatory Visit
Admission: RE | Admit: 2023-12-05 | Discharge: 2023-12-05 | Disposition: A | Discharge status: Home / Self Care | Payer: Medicare HMO | Source: Ambulatory Visit | Attending: Internal Medicine | Admitting: Internal Medicine

## 2023-12-05 DIAGNOSIS — H353231 Exudative age-related macular degeneration, bilateral, with active choroidal neovascularization: Secondary | ICD-10-CM | POA: Diagnosis not present

## 2023-12-05 DIAGNOSIS — Z1231 Encounter for screening mammogram for malignant neoplasm of breast: Secondary | ICD-10-CM

## 2023-12-08 DIAGNOSIS — L578 Other skin changes due to chronic exposure to nonionizing radiation: Secondary | ICD-10-CM | POA: Diagnosis not present

## 2023-12-08 DIAGNOSIS — L821 Other seborrheic keratosis: Secondary | ICD-10-CM | POA: Diagnosis not present

## 2023-12-08 DIAGNOSIS — Z85828 Personal history of other malignant neoplasm of skin: Secondary | ICD-10-CM | POA: Diagnosis not present

## 2023-12-08 DIAGNOSIS — D235 Other benign neoplasm of skin of trunk: Secondary | ICD-10-CM | POA: Diagnosis not present

## 2023-12-08 DIAGNOSIS — D2372 Other benign neoplasm of skin of left lower limb, including hip: Secondary | ICD-10-CM | POA: Diagnosis not present

## 2023-12-08 DIAGNOSIS — L719 Rosacea, unspecified: Secondary | ICD-10-CM | POA: Diagnosis not present

## 2023-12-08 DIAGNOSIS — D2271 Melanocytic nevi of right lower limb, including hip: Secondary | ICD-10-CM | POA: Diagnosis not present

## 2023-12-08 DIAGNOSIS — L409 Psoriasis, unspecified: Secondary | ICD-10-CM | POA: Diagnosis not present

## 2023-12-08 DIAGNOSIS — Z808 Family history of malignant neoplasm of other organs or systems: Secondary | ICD-10-CM | POA: Diagnosis not present

## 2024-01-02 DIAGNOSIS — H43393 Other vitreous opacities, bilateral: Secondary | ICD-10-CM | POA: Diagnosis not present

## 2024-01-02 DIAGNOSIS — H35033 Hypertensive retinopathy, bilateral: Secondary | ICD-10-CM | POA: Diagnosis not present

## 2024-01-02 DIAGNOSIS — H43813 Vitreous degeneration, bilateral: Secondary | ICD-10-CM | POA: Diagnosis not present

## 2024-01-02 DIAGNOSIS — H353231 Exudative age-related macular degeneration, bilateral, with active choroidal neovascularization: Secondary | ICD-10-CM | POA: Diagnosis not present

## 2024-01-06 ENCOUNTER — Telehealth: Payer: Self-pay

## 2024-01-06 NOTE — Telephone Encounter (Signed)
 Talked with patient concerning pricing for Prolia and patient would like to proceed.   Will submit for PA.

## 2024-01-17 ENCOUNTER — Telehealth: Payer: Self-pay

## 2024-01-17 NOTE — Telephone Encounter (Signed)
 PA has been submitted online through [employer] for Prolia. PA pending Reference# [id number]

## 2024-01-25 ENCOUNTER — Telehealth: Payer: Self-pay

## 2024-01-25 NOTE — Telephone Encounter (Signed)
 PA approved for Prolia  Please order Prolia for patient.  Thank you.

## 2024-01-26 ENCOUNTER — Other Ambulatory Visit: Payer: Self-pay

## 2024-01-26 DIAGNOSIS — M810 Age-related osteoporosis without current pathological fracture: Secondary | ICD-10-CM

## 2024-01-26 NOTE — Telephone Encounter (Signed)
 Thank you. Talked with patient and appt.has been scheduled.

## 2024-01-31 ENCOUNTER — Encounter: Payer: Self-pay | Admitting: Family Medicine

## 2024-01-31 ENCOUNTER — Ambulatory Visit (INDEPENDENT_AMBULATORY_CARE_PROVIDER_SITE_OTHER): Admitting: Family Medicine

## 2024-01-31 VITALS — BP 132/88 | HR 89 | Ht 62.0 in | Wt 170.0 lb

## 2024-01-31 DIAGNOSIS — M8589 Other specified disorders of bone density and structure, multiple sites: Secondary | ICD-10-CM

## 2024-01-31 DIAGNOSIS — E559 Vitamin D deficiency, unspecified: Secondary | ICD-10-CM

## 2024-01-31 DIAGNOSIS — Z87898 Personal history of other specified conditions: Secondary | ICD-10-CM

## 2024-01-31 DIAGNOSIS — K219 Gastro-esophageal reflux disease without esophagitis: Secondary | ICD-10-CM | POA: Diagnosis not present

## 2024-01-31 DIAGNOSIS — E785 Hyperlipidemia, unspecified: Secondary | ICD-10-CM | POA: Diagnosis not present

## 2024-01-31 DIAGNOSIS — F419 Anxiety disorder, unspecified: Secondary | ICD-10-CM | POA: Diagnosis not present

## 2024-01-31 DIAGNOSIS — Z0000 Encounter for general adult medical examination without abnormal findings: Secondary | ICD-10-CM

## 2024-01-31 DIAGNOSIS — N3281 Overactive bladder: Secondary | ICD-10-CM | POA: Diagnosis not present

## 2024-01-31 DIAGNOSIS — I700 Atherosclerosis of aorta: Secondary | ICD-10-CM

## 2024-01-31 DIAGNOSIS — R296 Repeated falls: Secondary | ICD-10-CM | POA: Diagnosis not present

## 2024-01-31 DIAGNOSIS — R0781 Pleurodynia: Secondary | ICD-10-CM | POA: Diagnosis not present

## 2024-01-31 DIAGNOSIS — N8110 Cystocele, unspecified: Secondary | ICD-10-CM

## 2024-01-31 DIAGNOSIS — E538 Deficiency of other specified B group vitamins: Secondary | ICD-10-CM | POA: Diagnosis not present

## 2024-01-31 DIAGNOSIS — I10 Essential (primary) hypertension: Secondary | ICD-10-CM

## 2024-01-31 NOTE — Assessment & Plan Note (Signed)
 Osteoporosis with T-score of -2.2. Transitioning from alendronate to Prolia. - Following with OP clinic at [facility]

## 2024-01-31 NOTE — Assessment & Plan Note (Signed)
 Recent labs stable. Continue to supplement and monitor.

## 2024-01-31 NOTE — Assessment & Plan Note (Signed)
 Blood pressure is at goal for age and co-morbidities.   Recommendations: HCTZ 25 mg daily - BP goal <130/80 - monitor and log blood pressures at home - check around the same time each day in a relaxed setting - Limit salt to <2000 mg/day - Follow DASH eating plan (heart healthy diet) - limit alcohol to 2 standard drinks per day for men and 1 per day for women - avoid tobacco products - get at least 2 hours of regular aerobic exercise weekly Patient aware of signs/symptoms requiring further/urgent evaluation. Labs recently stable.

## 2024-01-31 NOTE — Assessment & Plan Note (Signed)
 Medication management: Crestor 5 mg daily Lifestyle factors for lowering cholesterol include: Diet therapy - heart-healthy diet rich in fruits, veggies, fiber-rich whole grains, lean meats, chicken, fish (at least twice a week), fat-free or 1% dairy products; foods low in saturated/trans fats, cholesterol, sodium, and sugar. Mediterranean diet has shown to be very heart healthy. Regular exercise - recommend at least 30 minutes a day, 5 times per week Weight management  Recent labs stable.

## 2024-01-31 NOTE — Assessment & Plan Note (Signed)
>>  ASSESSMENT AND PLAN FOR OSTEOPENIA OF MULTIPLE SITES WRITTEN ON 01/31/2024 12:42 PM BY Garik Diamant B, NP  Osteoporosis with T-score of -2.2. Transitioning from alendronate  to Prolia. - Following with OP clinic at [facility]

## 2024-01-31 NOTE — Assessment & Plan Note (Signed)
 Prediabetes with A1c improved to 5.6%. No medication required at this time. Continue healthy lifestyle measures.
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
## 2024-01-31 NOTE — Assessment & Plan Note (Signed)
 OAB and frequent UTIs with persistent bacteria unresponsive to Macrobid. History of bladder surgeries with complications. Reluctant to repeat certain procedures. - Refer to a urogynecologist for further evaluation. - Consider pelvic floor physical therapy if interested.

## 2024-01-31 NOTE — Assessment & Plan Note (Signed)
 Stable with omeprazole and lifestyle measures.

## 2024-01-31 NOTE — Assessment & Plan Note (Signed)
 Continue statin and heart healthy lifestyle. Asymptomatic.

## 2024-01-31 NOTE — Assessment & Plan Note (Signed)
 Recent levels high. Currently only taking 1,000 mcg PO weekly.  Recheck at next visit.

## 2024-01-31 NOTE — Assessment & Plan Note (Signed)
 Stable. No SI/HI. Continue Cymbalta and lifestyle measures.

## 2024-02-01 ENCOUNTER — Ambulatory Visit: Admitting: Physician Assistant

## 2024-02-01 DIAGNOSIS — M818 Other osteoporosis without current pathological fracture: Secondary | ICD-10-CM | POA: Diagnosis not present

## 2024-02-01 DIAGNOSIS — M810 Age-related osteoporosis without current pathological fracture: Secondary | ICD-10-CM

## 2024-02-08 DIAGNOSIS — H353231 Exudative age-related macular degeneration, bilateral, with active choroidal neovascularization: Secondary | ICD-10-CM | POA: Diagnosis not present

## 2024-02-17 ENCOUNTER — Ambulatory Visit: Payer: Medicare HMO | Admitting: Nurse Practitioner

## 2024-02-21 ENCOUNTER — Ambulatory Visit (INDEPENDENT_AMBULATORY_CARE_PROVIDER_SITE_OTHER)

## 2024-02-21 VITALS — Ht 62.0 in | Wt 170.0 lb

## 2024-02-21 DIAGNOSIS — Z0000 Encounter for general adult medical examination without abnormal findings: Secondary | ICD-10-CM

## 2024-02-21 NOTE — Patient Instructions (Signed)
 Ms. Vanessa French , Thank you for taking time to come for your Medicare Wellness Visit. I appreciate your ongoing commitment to your health goals. Please review the following plan we discussed and let me know if I can assist you in the future.   Referrals/Orders/Follow-Ups/Clinician Recommendations: Aim for 30 minutes of exercise or brisk walking, 6-8 glasses of water, and 5 servings of fruits and vegetables each day.  This is a list of the screening recommended for you and due dates:  Health Maintenance  Topic Date Due   COVID-19 Vaccine (6 - 2024-25 season) 01/29/2025*   Flu Shot  05/25/2024   Colon Cancer Screening  08/26/2024   Medicare Annual Wellness Visit  02/20/2025   Mammogram  12/04/2025   DTaP/Tdap/Td vaccine (3 - Td or Tdap) 02/16/2033   Pneumonia Vaccine  Completed   DEXA scan (bone density measurement)  Completed   Hepatitis C Screening  Completed   Zoster (Shingles) Vaccine  Completed   HPV Vaccine  Aged Out   Meningitis B Vaccine  Aged Out  *Topic was postponed. The date shown is not the original due date.    Advanced directives: (ACP Link)Information on Advanced Care Planning can be found at [place]  Secretary of [facility] Advance Health Care Directives Advance Health Care Directives. [email or web address]   Next Medicare Annual Wellness Visit scheduled for next year: Yes

## 2024-02-21 NOTE — Progress Notes (Signed)
 Subjective:   Vanessa French is a 73 y.o. who presents for a Medicare Wellness preventive visit.  Visit Complete: Virtual I connected with  Vanessa French on 02/21/24 by a audio enabled telemedicine application and verified that I am speaking with the correct person using two identifiers.  Patient Location: Home  Provider Location: Home Office  I discussed the limitations of evaluation and management by telemedicine. The patient expressed understanding and agreed to proceed.  Vital Signs: Because this visit was a virtual/telehealth visit, some criteria may be missing or patient reported. Any vitals not documented were not able to be obtained and vitals that have been documented are patient reported.  VideoDeclined- This patient declined Librarian, academic. Therefore the visit was completed with audio only.  Persons Participating in Visit: Patient.  AWV Questionnaire: Yes: Patient Medicare AWV questionnaire was completed by the patient on 02/15/24; I have confirmed that all information answered by patient is correct and no changes since this date.  Cardiac Risk Factors include: advanced age (>73men, >48 women);hypertension;dyslipidemia     Objective:    Today's Vitals   02/21/24 1344  Weight: 170 lb (77.1 kg)  Height: 5\' 2"  (1.575 m)   Body mass index is 31.09 kg/m.     02/21/2024    1:52 PM 09/19/2023    6:10 PM 02/17/2023   11:16 AM 09/20/2022    5:09 PM 02/16/2022   11:36 AM 09/29/2021    9:55 AM 09/22/2021   11:05 AM  Advanced Directives  Does Patient Have a Medical Advance Directive? No Yes Yes No Yes Yes Yes  Type of Furniture conservator/restorer;Living will Healthcare Power of [place];Living will  Healthcare Power of [place];Living will Healthcare Power of [place];Living will   Does patient want to make changes to medical advance directive?  No - Patient declined No - Patient declined  No - Patient declined    Copy  of Healthcare Power of Attorney in Chart?  No - copy requested No - copy requested  No - copy requested No - copy requested No - copy requested  Would patient like information on creating a medical advance directive? Yes (MAU/Ambulatory/Procedural Areas - Information given)     No - Patient declined No - Patient declined    Current Medications (verified) Outpatient Encounter Medications as of 02/21/2024  Medication Sig   aspirin  EC 81 MG tablet Takes 1 tablet Daily   calcipotriene (DOVONOX) 0.005 % cream Apply 1 application topically at bedtime.   calcium  carbonate (TUMS EX) 750 MG chewable tablet Chew 1 tablet by mouth daily.   cetirizine (ZYRTEC) 10 MG tablet Take 10 mg by mouth as needed for allergies.   Cholecalciferol (VITAMIN D -3) 125 MCG (5000 UT) TABS Take one tablet daily   CRANBERRY PO Take by mouth. Take 2 caplets BID   Cyanocobalamin  (VITAMIN B-12) 1000 MCG SUBL Takes SL 3 x / week   denosumab (PROLIA) 60 MG/ML SOSY injection Inject 60 mg into the skin every 6 (six) months.   desonide (DESOWEN) 0.05 % ointment Apply 1 Application topically as needed.   DULoxetine  (CYMBALTA ) 60 MG capsule Take  1 capsule  Daily  for Mood & Chronic Back Pain   Faricimab -svoa (VABYSMO  IZ) by Intravitreal route every 30 (thirty) days.   hydrochlorothiazide  (HYDRODIURIL ) 25 MG tablet Take 1/2-1 tablet daily   meclizine  (ANTIVERT ) 25 MG tablet 1/2-1 pill up to 3 times daily for motion sickness/dizziness   omeprazole  (PRILOSEC) 40 MG  capsule Take 1 capsule Daily for Indigestion & Heartburn   pseudoephedrine  (SUDAFED) 120 MG 12 hr tablet Take  1 tablet  2 x /day (every 12 hours)  for Sinus & Chest Congestion   rosuvastatin  (CRESTOR ) 5 MG tablet Take 1 tab daily for cholesterol.   solifenacin  (VESICARE ) 5 MG tablet Take 1 tablet (5 mg total) by mouth daily. 1 tablet Orally Once a day   triamcinolone  cream (KENALOG ) 0.1 % Apply 1 application topically 2 (two) times daily. (Patient taking differently: Apply 1  application  topically as needed.)   [DISCONTINUED] Menaquinone-7 (K2 PO) Take by mouth.   No facility-administered encounter medications on file as of 02/21/2024.    Allergies (verified) Penicillins and Latex   History: Past Medical History:  Diagnosis Date   Acute pulmonary embolism [facility]) Post Covid - July 2022 05/14/2021   Cataract 2014   Removed   Closed fracture of right proximal humerus 04/07/2012   Diverticulosis of colon 02/14/2020   Frequent UTI    Ganglion cyst of wrist, left 08/11/2021   GERD (gastroesophageal reflux disease)    History of pulmonary embolus (PE) 11/03/2012   Hyperlipidemia    Hypertension    Left ureteral injury 08/29/2018   Psoriasis    Thoracic compression fracture [facility])    Past Surgical History:  Procedure Laterality Date   ABDOMINAL HYSTERECTOMY  2000   APPENDECTOMY  1959   BLADDER SUSPENSION  11/1998   BREAST CYST ASPIRATION Right 2011   CATARACT EXTRACTION, BILATERAL Bilateral 2016   Dr. Toya Friar REPAIR  11/10/2018   Dr. Lavona Pounds, [facility]   CYSTOCELE REPAIR  11/10/2018   Dr. Lavona Pounds   EXTENSOR TENDON OF FOREARM / WRIST REPAIR Left 2010   LEFT ARM   EYE SURGERY  2014   Cataract surgery   FRACTURE SURGERY  2013   Right humorous   GANGLION CYST EXCISION Left 09/29/2021   Procedure: EXCISION VOLAR CYST LEFT WRIST RECURRENT;  Surgeon: Lyanne Sample, MD;  Location: [facility];  Service: Orthopedics;  Laterality: Left;  regional with MAC   ORIF HUMERUS FRACTURE  04/07/2012   Procedure: OPEN REDUCTION INTERNAL FIXATION (ORIF) PROXIMAL HUMERUS FRACTURE;  Surgeon: Adah Acron, MD;  Location: [facility] OR;  Service: Orthopedics;  Laterality: Right;  Open Reduction Internal Fixation Right Proxmial Humerus   STERIOD INJECTION  04/07/2012   Procedure: STEROID INJECTION;  Surgeon: Adah Acron, MD;  Location: [facility] OR;  Service: Orthopedics;  Laterality: Right;   Right 1st Dorsal Compartment Injection   TONSILLECTOMY  1962    URETER REVISION Left 11/3003   COMPLIATION OF BLADDER SLING   vaginal hysterectomy with unilateral salpingooopherectomy  2000   Family History  Problem Relation Age of Onset   Heart disease Mother    Arthritis Mother    Hyperlipidemia Mother    Hypertension Mother    Colon cancer Father 50   Cancer Father    Early death Father    Atrial fibrillation Sister    Stroke Maternal Grandmother    Heart disease Maternal Grandfather    Heart failure Maternal Grandfather 40   Kidney Stones Son    Colon cancer Maternal Uncle    Diabetes Son    Social History   Socioeconomic History   Marital status: Married    Spouse name: Not on file   Number of children: 3   Years of education: Not on file   Highest education level: Bachelor's degree (e.g., BA, AB, BS)  Occupational History   Occupation: Teacher, adult education: [employer]  Tobacco Use   Smoking status: Never   Smokeless tobacco: Never  Vaping Use   Vaping status: Never Used  Substance and Sexual Activity   Alcohol use: No   Drug use: No   Sexual activity: Yes    Partners: Male    Birth control/protection: Post-menopausal  Other Topics Concern   Not on file  Social History Narrative   Not on file   Social Drivers of Health   Financial Resource Strain: Low Risk  (02/21/2024)   Overall Financial Resource Strain (CARDIA)    Difficulty of Paying Living Expenses: Not hard at all  Food Insecurity: No Food Insecurity (02/21/2024)   Hunger Vital Sign    Worried About Running Out of Food in the Last Year: Never true    Ran Out of Food in the Last Year: Never true  Transportation Needs: No Transportation Needs (02/21/2024)   PRAPARE - Administrator, Civil Service (Medical): No    Lack of Transportation (Non-Medical): No  Physical Activity: Sufficiently Active (02/21/2024)   Exercise Vital Sign    Days of Exercise per Week: 3 days    Minutes of Exercise per Session: 60 min  Stress: No Stress Concern Present (02/21/2024)    [employer] of Occupational Health - Occupational Stress Questionnaire    Feeling of Stress : Not at all  Social Connections: Socially Integrated (02/21/2024)   Social Connection and Isolation Panel [NHANES]    Frequency of Communication with Friends and Family: More than three times a week    Frequency of Social Gatherings with Friends and Family: Twice a week    Attends Religious Services: More than 4 times per year    Active Member of [employer] or Organizations: Yes    Attends Engineer, structural: More than 4 times per year    Marital Status: Married    Tobacco Counseling Counseling given: Not Answered    Clinical Intake:  Pre-visit preparation completed: Yes  Pain : No/denies pain  Diabetes: No  Lab Results  Component Value Date   HGBA1C 5.6 11/22/2023   HGBA1C 5.9 (H) 08/22/2023   HGBA1C 5.6 08/11/2022     How often do you need to have someone help you when you read instructions, pamphlets, or other written materials from your doctor or pharmacy?: 1 - Never  Interpreter Needed?: No  Information entered by :: Seabron Cypress LPN   Activities of Daily Living     02/15/2024    9:49 AM  In your present state of health, do you have any difficulty performing the following activities:  Hearing? 0  Vision? 0  Difficulty concentrating or making decisions? 0  Walking or climbing stairs? 0  Dressing or bathing? 0  Doing errands, shopping? 0  Preparing Food and eating ? N  Using the Toilet? N  In the past six months, have you accidently leaked urine? Y  Do you have problems with loss of bowel control? N  Managing your Medications? N  Managing your Finances? N  Housekeeping or managing your Housekeeping? N    Patient Care Team: Everlina Hock, NP as PCP - General (Family Medicine) Thais Fill, MD as Consulting Physician (Dermatology) Adah Acron, MD (Inactive) as Consulting Physician (Orthopedic Surgery) Alyce Jubilee, MD as Referring Physician  (Ophthalmology) Alvina Axon, MD as Consulting Physician (Ophthalmology) Persons, Norma Beckers, [place] (Orthopedic Surgery)  Indicate any recent Medical Services you may  have received from other than [facility] providers in the past year (date may be approximate).     Assessment:   This is a routine wellness examination for [place].  Hearing/Vision screen Hearing Screening - Comments:: Denies hearing difficulties   Vision Screening - Comments:: Wears rx glasses - up to date with routine eye exams with Dr. Terrall Ferraris    Goals Addressed             This Visit's Progress    Remain active and independent         Depression Screen     02/21/2024    1:50 PM 01/31/2024   12:30 PM 02/17/2023   11:18 AM 01/24/2023    8:06 PM 02/16/2022   11:40 AM 11/16/2021   11:22 PM 08/11/2021    9:09 AM  PHQ 2/9 Scores  PHQ - 2 Score 0 0 2 0 0 0 0  PHQ- 9 Score 0 1 2        Fall Risk     02/15/2024    9:49 AM 01/31/2024   12:30 PM 02/17/2023   11:17 AM 01/24/2023    8:05 PM 02/16/2022   11:40 AM  Fall Risk   Falls in the past year? 1 1 1  0 0  Number falls in past yr: 1 1 1   0  Injury with Fall? 0 1 1  0  Risk for fall due to : History of fall(s) History of fall(s) History of fall(s);Orthopedic patient No Fall Risks No Fall Risks  Follow up Education provided;Falls prevention discussed;Falls evaluation completed Falls evaluation completed Falls evaluation completed;Falls prevention discussed Falls evaluation completed;Education provided;Falls prevention discussed Falls prevention discussed;Falls evaluation completed    MEDICARE RISK AT HOME:  Medicare Risk at Home Any stairs in or around the home?: (Patient-Rptd) Yes If so, are there any without handrails?: (Patient-Rptd) No Home free of loose throw rugs in walkways, pet beds, electrical cords, etc?: (Patient-Rptd) Yes Adequate lighting in your home to reduce risk of falls?: (Patient-Rptd) Yes Life alert?: (Patient-Rptd) No Use of a cane, walker or w/c?:  (Patient-Rptd) No Grab bars in the bathroom?: (Patient-Rptd) Yes Shower chair or bench in shower?: (Patient-Rptd) No Elevated toilet seat or a handicapped toilet?: (Patient-Rptd) No  TIMED UP AND GO:  Was the test performed?  No  Cognitive Function: 6CIT completed        02/21/2024    1:52 PM  6CIT Screen  What Year? 0 points  What month? 0 points  What time? 0 points  Count back from 20 0 points  Months in reverse 0 points  Repeat phrase 0 points  Total Score 0 points    Immunizations Immunization History  Administered Date(s) Administered   Hepatitis B 01/11/2013   Influenza Split 07/25/2012, 08/12/2014, 08/21/2015   Influenza, High Dose Seasonal PF 08/02/2017, 07/13/2018, 08/03/2019, 08/06/2020, 08/11/2021, 08/11/2022, 08/22/2023   Influenza,inj,quad, With Preservative 08/13/2016   PFIZER(Purple Top)SARS-COV-2 Vaccination 12/23/2019, 01/16/2020, 09/20/2020, 09/10/2021   PPD Test 07/31/2014, 07/28/2015, 08/13/2016, 07/13/2018   Pfizer(Comirnaty )Fall Seasonal Vaccine 12 years and older 08/25/2022   Pneumococcal Conjugate-13 12/15/2017   Pneumococcal Polysaccharide-23 04/16/2002, 02/16/2022   Td 02/17/2023   Tdap 12/21/2011   Zoster Recombinant(Shingrix) 03/04/2022, 06/15/2022   Zoster, Live 10/26/2003    Screening Tests Health Maintenance  Topic Date Due   COVID-19 Vaccine (6 - 2024-25 season) 01/29/2025 (Originally 06/26/2023)   INFLUENZA VACCINE  05/25/2024   Colonoscopy  08/26/2024   Medicare Annual Wellness (AWV)  02/20/2025   MAMMOGRAM  12/04/2025  DTaP/Tdap/Td (3 - Td or Tdap) 02/16/2033   Pneumonia Vaccine 60+ Years old  Completed   DEXA SCAN  Completed   Hepatitis C Screening  Completed   Zoster Vaccines- Shingrix  Completed   HPV VACCINES  Aged Out   Meningococcal B Vaccine  Aged Out    Health Maintenance  There are no preventive care reminders to display for this patient.  Additional Screening:  Vision Screening: Recommended annual  ophthalmology exams for early detection of glaucoma and other disorders of the eye.  Dental Screening: Recommended annual dental exams for proper oral hygiene  Community Resource Referral / Chronic Care Management: CRR required this visit?  No   CCM required this visit?  No     Plan:     I have personally reviewed and noted the following in the patient's chart:   Medical and social history Use of alcohol, tobacco or illicit drugs  Current medications and supplements including opioid prescriptions. Patient is not currently taking opioid prescriptions. Functional ability and status Nutritional status Physical activity Advanced directives List of other physicians Hospitalizations, surgeries, and ER visits in previous 12 months Vitals Screenings to include cognitive, depression, and falls Referrals and appointments  In addition, I have reviewed and discussed with patient certain preventive protocols, quality metrics, and best practice recommendations. A written personalized care plan for preventive services as well as general preventive health recommendations were provided to patient.     Seabron Cypress [place], [place]   9/52/8413   After Visit Summary: (MyChart) Due to this being a telephonic visit, the after visit summary with patients personalized plan was offered to patient via MyChart   Notes: Nothing significant to report at this time.

## 2024-03-12 DIAGNOSIS — H35033 Hypertensive retinopathy, bilateral: Secondary | ICD-10-CM | POA: Diagnosis not present

## 2024-03-12 DIAGNOSIS — H43813 Vitreous degeneration, bilateral: Secondary | ICD-10-CM | POA: Diagnosis not present

## 2024-03-12 DIAGNOSIS — H43393 Other vitreous opacities, bilateral: Secondary | ICD-10-CM | POA: Diagnosis not present

## 2024-03-12 DIAGNOSIS — H353231 Exudative age-related macular degeneration, bilateral, with active choroidal neovascularization: Secondary | ICD-10-CM | POA: Diagnosis not present

## 2024-03-20 ENCOUNTER — Other Ambulatory Visit: Payer: Self-pay | Admitting: Neurology

## 2024-03-20 DIAGNOSIS — M5489 Other dorsalgia: Secondary | ICD-10-CM

## 2024-03-20 MED ORDER — DULOXETINE HCL 60 MG PO CPEP: ORAL_CAPSULE | ORAL | 3 refills | Status: AC

## 2024-04-19 DIAGNOSIS — H35033 Hypertensive retinopathy, bilateral: Secondary | ICD-10-CM | POA: Diagnosis not present

## 2024-04-19 DIAGNOSIS — H43813 Vitreous degeneration, bilateral: Secondary | ICD-10-CM | POA: Diagnosis not present

## 2024-04-19 DIAGNOSIS — H43393 Other vitreous opacities, bilateral: Secondary | ICD-10-CM | POA: Diagnosis not present

## 2024-04-19 DIAGNOSIS — Z961 Presence of intraocular lens: Secondary | ICD-10-CM | POA: Diagnosis not present

## 2024-04-19 DIAGNOSIS — H353231 Exudative age-related macular degeneration, bilateral, with active choroidal neovascularization: Secondary | ICD-10-CM | POA: Diagnosis not present

## 2024-04-19 DIAGNOSIS — H353211 Exudative age-related macular degeneration, right eye, with active choroidal neovascularization: Secondary | ICD-10-CM | POA: Diagnosis not present

## 2024-04-19 LAB — HM DIABETES EYE EXAM

## 2024-04-26 ENCOUNTER — Encounter: Payer: Self-pay | Admitting: Neurology

## 2024-05-01 ENCOUNTER — Encounter: Payer: Self-pay | Admitting: Family Medicine

## 2024-05-01 ENCOUNTER — Ambulatory Visit: Admitting: Family Medicine

## 2024-05-01 ENCOUNTER — Other Ambulatory Visit (HOSPITAL_COMMUNITY)
Admission: RE | Admit: 2024-05-01 | Discharge: 2024-05-01 | Disposition: A | Discharge status: Home / Self Care | Source: Ambulatory Visit | Attending: Family Medicine | Admitting: Family Medicine

## 2024-05-01 VITALS — BP 139/86 | HR 100 | Ht 62.0 in | Wt 169.0 lb

## 2024-05-01 DIAGNOSIS — N898 Other specified noninflammatory disorders of vagina: Secondary | ICD-10-CM | POA: Insufficient documentation

## 2024-05-01 DIAGNOSIS — Z113 Encounter for screening for infections with a predominantly sexual mode of transmission: Secondary | ICD-10-CM | POA: Insufficient documentation

## 2024-05-01 DIAGNOSIS — Z87898 Personal history of other specified conditions: Secondary | ICD-10-CM | POA: Diagnosis not present

## 2024-05-01 DIAGNOSIS — N949 Unspecified condition associated with female genital organs and menstrual cycle: Secondary | ICD-10-CM

## 2024-05-01 DIAGNOSIS — F419 Anxiety disorder, unspecified: Secondary | ICD-10-CM

## 2024-05-01 DIAGNOSIS — H35039 Hypertensive retinopathy, unspecified eye: Secondary | ICD-10-CM

## 2024-05-01 DIAGNOSIS — E559 Vitamin D deficiency, unspecified: Secondary | ICD-10-CM

## 2024-05-01 DIAGNOSIS — K219 Gastro-esophageal reflux disease without esophagitis: Secondary | ICD-10-CM

## 2024-05-01 DIAGNOSIS — I10 Essential (primary) hypertension: Secondary | ICD-10-CM | POA: Diagnosis not present

## 2024-05-01 DIAGNOSIS — N3281 Overactive bladder: Secondary | ICD-10-CM

## 2024-05-01 DIAGNOSIS — E538 Deficiency of other specified B group vitamins: Secondary | ICD-10-CM

## 2024-05-01 DIAGNOSIS — E785 Hyperlipidemia, unspecified: Secondary | ICD-10-CM | POA: Diagnosis not present

## 2024-05-01 DIAGNOSIS — R399 Unspecified symptoms and signs involving the genitourinary system: Secondary | ICD-10-CM

## 2024-05-01 DIAGNOSIS — I700 Atherosclerosis of aorta: Secondary | ICD-10-CM | POA: Diagnosis not present

## 2024-05-01 DIAGNOSIS — M810 Age-related osteoporosis without current pathological fracture: Secondary | ICD-10-CM

## 2024-05-01 LAB — POC URINALSYSI DIPSTICK (AUTOMATED)
Bilirubin, UA: NEGATIVE
Blood, UA: NEGATIVE
Glucose, UA: NEGATIVE
Ketones, UA: NEGATIVE
Nitrite, UA: NEGATIVE
Protein, UA: POSITIVE — AB
Spec Grav, UA: <=1.005 — AB (ref 1.010–1.025)
Urobilinogen, UA: 0.2 U/dL
pH, UA: 7 (ref 5.0–8.0)

## 2024-05-01 LAB — COMPREHENSIVE METABOLIC PANEL WITH GFR
ALT: 11 U/L (ref 0–35)
AST: 17 U/L (ref 0–37)
Albumin: 4.2 g/dL (ref 3.5–5.2)
Alkaline Phosphatase: 53 U/L (ref 39–117)
BUN: 13 mg/dL (ref 6–23)
CO2: 28 meq/L (ref 19–32)
Calcium: 9.2 mg/dL (ref 8.4–10.5)
Chloride: 95 meq/L — ABNORMAL LOW (ref 96–112)
Creatinine, Ser: 0.67 mg/dL (ref 0.40–1.20)
GFR: 87.28 mL/min (ref >60.00)
Glucose, Bld: 105 mg/dL — ABNORMAL HIGH (ref 70–99)
Potassium: 4.2 meq/L (ref 3.5–5.1)
Sodium: 132 meq/L — ABNORMAL LOW (ref 135–145)
Total Bilirubin: 0.7 mg/dL (ref 0.2–1.2)
Total Protein: 7.4 g/dL (ref 6.0–8.3)

## 2024-05-01 LAB — CBC WITH DIFFERENTIAL/PLATELET
Basophils Absolute: 0 K/uL (ref 0.0–0.1)
Basophils Relative: 0.6 % (ref 0.0–3.0)
Eosinophils Absolute: 0.1 K/uL (ref 0.0–0.7)
Eosinophils Relative: 1.2 % (ref 0.0–5.0)
HCT: 47.5 % — ABNORMAL HIGH (ref 36.0–46.0)
Hemoglobin: 15.7 g/dL — ABNORMAL HIGH (ref 12.0–15.0)
Lymphocytes Relative: 26.4 % (ref 12.0–46.0)
Lymphs Abs: 2 K/uL (ref 0.7–4.0)
MCHC: 33 g/dL (ref 30.0–36.0)
MCV: 91.1 fl (ref 78.0–100.0)
Monocytes Absolute: 0.6 K/uL (ref 0.1–1.0)
Monocytes Relative: 8.6 % (ref 3.0–12.0)
Neutro Abs: 4.7 K/uL (ref 1.4–7.7)
Neutrophils Relative %: 63.2 % (ref 43.0–77.0)
Platelets: 191 K/uL (ref 150.0–400.0)
RBC: 5.21 Mil/uL — ABNORMAL HIGH (ref 3.87–5.11)
RDW: 13.3 % (ref 11.5–15.5)
WBC: 7.4 K/uL (ref 4.0–10.5)

## 2024-05-01 LAB — LIPID PANEL
Cholesterol: 143 mg/dL (ref 0–200)
HDL: 61.2 mg/dL (ref >39.00)
LDL Cholesterol: 68 mg/dL (ref 0–99)
NonHDL: 81.55
Total CHOL/HDL Ratio: 2
Triglycerides: 70 mg/dL (ref 0.0–149.0)
VLDL: 14 mg/dL (ref 0.0–40.0)

## 2024-05-01 LAB — B12 AND FOLATE PANEL
Folate: >23.4 ng/mL (ref >5.9)
Vitamin B-12: 1110 pg/mL — ABNORMAL HIGH (ref 211–911)

## 2024-05-01 LAB — HEMOGLOBIN A1C: Hgb A1c MFr Bld: 5.9 % (ref 4.6–6.5)

## 2024-05-01 LAB — VITAMIN D 25 HYDROXY (VIT D DEFICIENCY, FRACTURES): VITD: 86.59 ng/mL (ref 30.00–100.00)

## 2024-05-01 LAB — TSH: TSH: 1.05 u[IU]/mL (ref 0.35–5.50)

## 2024-05-01 MED ORDER — SOLIFENACIN SUCCINATE 5 MG PO TABS: 5.0000 mg | ORAL_TABLET | Freq: Every day | ORAL | 3 refills | Status: DC

## 2024-05-01 MED ORDER — NITROFURANTOIN MONOHYD MACRO 100 MG PO CAPS: 100.0000 mg | ORAL_CAPSULE | Freq: Two times a day (BID) | ORAL | 0 refills | Status: AC

## 2024-05-01 NOTE — Assessment & Plan Note (Signed)
 Continue statin and heart healthy lifestyle. Asymptomatic.

## 2024-05-01 NOTE — Assessment & Plan Note (Signed)
 Labs today. No medication required at this time. Continue healthy lifestyle measures.  Lab Results  Component Value Date   HGBA1C 5.6 11/22/2023

## 2024-05-01 NOTE — Progress Notes (Addendum)
 Established Patient Office Visit  Subjective    Patient ID: Vanessa French, female    DOB: 23-Feb-1951  Age: 73 y.o. MRN: [id number]  CC:  Chief Complaint  Patient presents with   Medical Management of Chronic Issues    HPI Vanessa French presents for chronic disease management. She has been doing well overall, but has recent intermittent urinary/perineal burning. No other urinary changes apart from baseline overactive bladder.  Hyperlipidemia, Aortic Atherosclerosis: - medications: rosuvastatin  5 mg daily, aspirin  81 mg daily - compliance: good - medication SEs: none The 10-year ASCVD risk score (Arnett DK, et al., 2019) is: 16.8%   Values used to calculate the score:     Age: 9 years     Clincally relevant sex: Female     Is Non-Hispanic African American: No     Diabetic: No     Tobacco smoker: No     Systolic Blood Pressure: 139 mmHg     Is BP treated: Yes     HDL Cholesterol: 73 mg/dL     Total Cholesterol: 162 mg/dL   Hypertension: - Medications: hctz 25 mg daily - Compliance: good - Checking BP at home: good - Denies any SOB, recurrent headaches, CP, vision changes, LE edema, dizziness, palpitations, or medication side effects. - Diet: heart healthy - Exercise: 3 day/week gym   Pre-diabetes: - Checking glucose at home: no - Medications: none - Compliance: n/a - Denies symptoms of hypoglycemia, polyuria, polydipsia, numbness extremities, foot ulcers/trauma, wounds that are not healing, medication side effects  Lab Results  Component Value Date   HGBA1C 5.6 11/22/2023    GERD: - management: omeprazole  40 mg daily - symptoms: controlled   Vitamin D  deficiency: - management: 5,000 units daily - normal range at most recent check in October 2024   B12 deficiency: - management: 1,000 mcg weekly - symptoms: none - last labs: January 2025 (1,461)   Osteopenia, hx compression fractures: - management: Prolia, previously on Fosamax  (15 years)  Prolia - Following with [facility]  - DEXA October 2023: T-score -2.2   Overactive bladder: - management: solifenacin  5 mg daily - at our last visit (April 2025) we referred to urogynecology to discuss treatment options - scheduled to see them on July 31st.   Mood follow-up: - Diagnosis: anxiety - Treatment: duloxetine  60 mg daily - Medication side effects: none - SI/HI: no - Update: stable      02/21/2024    1:50 PM 01/31/2024   12:30 PM 02/17/2023   11:18 AM  PHQ9 SCORE ONLY  PHQ-9 Total Score 0 1 2      01/31/2024   12:30 PM  GAD 7 : Generalized Anxiety Score  Nervous, Anxious, on Edge 0  Control/stop worrying 0  Worry too much - different things 0  Trouble relaxing 0  Restless 0  Easily annoyed or irritable 0  Afraid - awful might happen 0  Total GAD 7 Score 0  Anxiety Difficulty Not difficult at all     Psoriasis: - topical therapies managed by [facility] Elray Reusing, MD)     ROS All review of systems negative except what is listed in the HPI      Objective    BP 139/86   Pulse 100   Ht 5' 2 (1.575 m)   Wt 169 lb (76.7 kg)   SpO2 98%   BMI 30.91 kg/m   Physical Exam Vitals reviewed.  Constitutional:      Appearance: Normal appearance. She is  obese.  Cardiovascular:     Rate and Rhythm: Normal rate and regular rhythm.  Pulmonary:     Effort: Pulmonary effort is normal.     Breath sounds: Normal breath sounds.  Genitourinary:    Comments: Deferred, self-swab Skin:    General: Skin is warm and dry.  Neurological:     Mental Status: She is alert and oriented to person, place, and time.  Psychiatric:        Mood and Affect: Mood normal.        Behavior: Behavior normal.        Thought Content: Thought content normal.        Judgment: Judgment normal.             Assessment & Plan:   Problem List Items Addressed This Visit       Active Problems   Anxiety (Chronic)   Stable. No SI/HI. Continue Cymbalta  and  lifestyle measures.       OAB (overactive bladder) - Primary (Chronic)   OAB and frequent UTIs. History of bladder surgeries with complications. Reluctant to repeat certain procedures. - Scheduled to see urogynecology this month - Referral for pelvic floor physical therapy  - UTI symptoms/burning today - see below      Relevant Medications   solifenacin  (VESICARE ) 5 MG tablet   Other Relevant Orders   Ambulatory referral to Physical Therapy   POCT Urinalysis Dipstick (Automated) (Completed)   Urine Culture   History of prediabetes (Chronic)   Labs today. No medication required at this time. Continue healthy lifestyle measures.  Lab Results  Component Value Date   HGBA1C 5.6 11/22/2023          Relevant Orders   Hemoglobin A1c   Hyperlipidemia (Chronic)   Medication management: Crestor  5 mg daily Lifestyle factors for lowering cholesterol include: Diet therapy - heart-healthy diet rich in fruits, veggies, fiber-rich whole grains, lean meats, chicken, fish (at least twice a week), fat-free or 1% dairy products; foods low in saturated/trans fats, cholesterol, sodium, and sugar. Mediterranean diet has shown to be very heart healthy. Regular exercise - recommend at least 30 minutes a day, 5 times per week Weight management  Recent labs stable. Labs today      Vitamin D  deficiency (Chronic)   Continue to supplement and monitor.       Relevant Orders   VITAMIN D  25 Hydroxy (Vit-D Deficiency, Fractures)   GERD (gastroesophageal reflux disease) (Chronic)   Stable with omeprazole  and lifestyle measures.       Essential hypertension (Chronic)   Blood pressure is at goal for age and co-morbidities.   Recommendations: HCTZ 25 mg daily - BP goal <130/80 - monitor and log blood pressures at home - check around the same time each day in a relaxed setting - Limit salt to <2000 mg/day - Follow DASH eating plan (heart healthy diet) - limit alcohol to 2 standard drinks per day for  men and 1 per day for women - avoid tobacco products - get at least 2 hours of regular aerobic exercise weekly Patient aware of signs/symptoms requiring further/urgent evaluation. Labs today       Relevant Orders   Comprehensive metabolic panel with GFR   TSH   B12 deficiency (Chronic)   Currently only taking 1,000 mcg PO weekly. Labs today.       Relevant Orders   B12 and Folate Panel   CBC with Differential/Platelet   Aortic atherosclerosis (HCC)- CT 04/2021 (Chronic)  Continue statin and heart healthy lifestyle. Asymptomatic.       Relevant Orders   Lipid panel   Age-related osteoporosis without current pathological fracture (Chronic)   Transitioned from Fosamax  to Prolia. One Prolia injection received. - Continue Prolia injections every 6 months. - Ensure follow-up with ortho care office for Prolia administration.      Hypertensive retinopathy   Following with ophthalmology. Tight BP control encouraged.       Other Visit Diagnoses       Perineal discomfort in female     Vaginal irritation  Deferred exam. Vaginal swab today. Keep upcoming urogynecology appointment. Referral placed for pelvic floor PT.   Relevant Orders   Cervicovaginal ancillary only   POCT Urinalysis Dipstick (Automated) (Completed)   Urine Culture     UTI symptoms     Large leuks - will send Macrobid , this usually works well for her. Can adjust pending culture results if needed. Keep upcoming urogynecology appointment.    Relevant Medications   nitrofurantoin , macrocrystal-monohydrate, (MACROBID ) 100 MG capsule         Return in about 6 months (around 11/01/2024) for physical.   Waddell KATHEE Mon, NP

## 2024-05-01 NOTE — Assessment & Plan Note (Signed)
 Blood pressure is at goal for age and co-morbidities.   Recommendations: HCTZ 25 mg daily - BP goal <130/80 - monitor and log blood pressures at home - check around the same time each day in a relaxed setting - Limit salt to <2000 mg/day - Follow DASH eating plan (heart healthy diet) - limit alcohol to 2 standard drinks per day for men and 1 per day for women - avoid tobacco products - get at least 2 hours of regular aerobic exercise weekly Patient aware of signs/symptoms requiring further/urgent evaluation. Labs today

## 2024-05-01 NOTE — Assessment & Plan Note (Signed)
 Medication management: Crestor  5 mg daily Lifestyle factors for lowering cholesterol include: Diet therapy - heart-healthy diet rich in fruits, veggies, fiber-rich whole grains, lean meats, chicken, fish (at least twice a week), fat-free or 1% dairy products; foods low in saturated/trans fats, cholesterol, sodium, and sugar. Mediterranean diet has shown to be very heart healthy. Regular exercise - recommend at least 30 minutes a day, 5 times per week Weight management  Recent labs stable. Labs today

## 2024-05-01 NOTE — Assessment & Plan Note (Signed)
 OAB and frequent UTIs. History of bladder surgeries with complications. Reluctant to repeat certain procedures. - Scheduled to see urogynecology this month - Referral for pelvic floor physical therapy  - UTI symptoms/burning today - see below

## 2024-05-01 NOTE — Assessment & Plan Note (Signed)
 Currently only taking 1,000 mcg PO weekly. Labs today.

## 2024-05-01 NOTE — Assessment & Plan Note (Signed)
 Transitioned from Fosamax  to Prolia. One Prolia injection received. - Continue Prolia injections every 6 months. - Ensure follow-up with ortho care office for Prolia administration.

## 2024-05-01 NOTE — Assessment & Plan Note (Signed)
 Following with ophthalmology. Tight BP control encouraged.

## 2024-05-01 NOTE — Assessment & Plan Note (Signed)
 Stable with omeprazole and lifestyle measures.

## 2024-05-01 NOTE — Assessment & Plan Note (Signed)
Continue to supplement and monitor 

## 2024-05-01 NOTE — Assessment & Plan Note (Signed)
 Stable. No SI/HI. Continue Cymbalta and lifestyle measures.

## 2024-05-02 ENCOUNTER — Ambulatory Visit: Payer: Self-pay | Admitting: Family Medicine

## 2024-05-02 DIAGNOSIS — E871 Hypo-osmolality and hyponatremia: Secondary | ICD-10-CM

## 2024-05-02 LAB — CERVICOVAGINAL ANCILLARY ONLY
Bacterial Vaginitis (gardnerella): NEGATIVE
Candida Glabrata: NEGATIVE
Candida Vaginitis: NEGATIVE
Comment: NEGATIVE
Comment: NEGATIVE
Comment: NEGATIVE

## 2024-05-02 LAB — URINE CULTURE
MICRO NUMBER:: 16670969
SPECIMEN QUALITY:: ADEQUATE

## 2024-05-03 ENCOUNTER — Telehealth: Payer: Self-pay | Admitting: Family Medicine

## 2024-05-03 DIAGNOSIS — N3281 Overactive bladder: Secondary | ICD-10-CM

## 2024-05-03 DIAGNOSIS — M6281 Muscle weakness (generalized): Secondary | ICD-10-CM | POA: Diagnosis not present

## 2024-05-03 DIAGNOSIS — K5900 Constipation, unspecified: Secondary | ICD-10-CM | POA: Diagnosis not present

## 2024-05-03 DIAGNOSIS — N949 Unspecified condition associated with female genital organs and menstrual cycle: Secondary | ICD-10-CM

## 2024-05-03 DIAGNOSIS — R102 Pelvic and perineal pain: Secondary | ICD-10-CM | POA: Diagnosis not present

## 2024-05-03 DIAGNOSIS — N390 Urinary tract infection, site not specified: Secondary | ICD-10-CM

## 2024-05-03 MED ORDER — ESTRADIOL 0.1 MG/GM VA CREA: TOPICAL_CREAM | VAGINAL | 12 refills | Status: AC

## 2024-05-03 NOTE — Telephone Encounter (Signed)
 Spoke to patient's pelvic floor therapist Kyra Fireman, PT). She initial evaluation with patient today and based on history/presentation, she believes she would be a good candidate to try estradiol  cream. Patient reported she had never tried before and is eager to try while waiting to get in with urogynecology later this month. Patient requested PT reach out to us  so we could order.   Order placed

## 2024-05-08 ENCOUNTER — Other Ambulatory Visit (INDEPENDENT_AMBULATORY_CARE_PROVIDER_SITE_OTHER)

## 2024-05-08 DIAGNOSIS — E871 Hypo-osmolality and hyponatremia: Secondary | ICD-10-CM

## 2024-05-08 LAB — BASIC METABOLIC PANEL WITH GFR
BUN: 12 mg/dL (ref 6–23)
CO2: 33 meq/L — ABNORMAL HIGH (ref 19–32)
Calcium: 9.1 mg/dL (ref 8.4–10.5)
Chloride: 96 meq/L (ref 96–112)
Creatinine, Ser: 0.54 mg/dL (ref 0.40–1.20)
GFR: 91.93 mL/min (ref >60.00)
Glucose, Bld: 125 mg/dL — ABNORMAL HIGH (ref 70–99)
Potassium: 3.7 meq/L (ref 3.5–5.1)
Sodium: 134 meq/L — ABNORMAL LOW (ref 135–145)

## 2024-05-09 ENCOUNTER — Ambulatory Visit: Payer: Self-pay | Admitting: Family Medicine

## 2024-05-10 DIAGNOSIS — M6281 Muscle weakness (generalized): Secondary | ICD-10-CM | POA: Diagnosis not present

## 2024-05-10 DIAGNOSIS — K5900 Constipation, unspecified: Secondary | ICD-10-CM | POA: Diagnosis not present

## 2024-05-10 DIAGNOSIS — R102 Pelvic and perineal pain: Secondary | ICD-10-CM | POA: Diagnosis not present

## 2024-05-17 DIAGNOSIS — R102 Pelvic and perineal pain: Secondary | ICD-10-CM | POA: Diagnosis not present

## 2024-05-17 DIAGNOSIS — K5900 Constipation, unspecified: Secondary | ICD-10-CM | POA: Diagnosis not present

## 2024-05-17 DIAGNOSIS — M6281 Muscle weakness (generalized): Secondary | ICD-10-CM | POA: Diagnosis not present

## 2024-05-21 DIAGNOSIS — H353231 Exudative age-related macular degeneration, bilateral, with active choroidal neovascularization: Secondary | ICD-10-CM | POA: Diagnosis not present

## 2024-05-23 DIAGNOSIS — M6281 Muscle weakness (generalized): Secondary | ICD-10-CM | POA: Diagnosis not present

## 2024-05-23 DIAGNOSIS — K5900 Constipation, unspecified: Secondary | ICD-10-CM | POA: Diagnosis not present

## 2024-05-23 DIAGNOSIS — R102 Pelvic and perineal pain: Secondary | ICD-10-CM | POA: Diagnosis not present

## 2024-05-24 ENCOUNTER — Ambulatory Visit: Admitting: Obstetrics and Gynecology

## 2024-05-31 ENCOUNTER — Other Ambulatory Visit (HOSPITAL_COMMUNITY)
Admission: RE | Admit: 2024-05-31 | Discharge: 2024-05-31 | Disposition: A | Discharge status: Home / Self Care | Source: Other Acute Inpatient Hospital | Attending: Obstetrics | Admitting: Obstetrics

## 2024-05-31 ENCOUNTER — Encounter: Payer: Self-pay | Admitting: Obstetrics

## 2024-05-31 ENCOUNTER — Ambulatory Visit: Admitting: Obstetrics

## 2024-05-31 VITALS — BP 118/80 | HR 88 | Ht 62.21 in | Wt 171.8 lb

## 2024-05-31 DIAGNOSIS — N941 Unspecified dyspareunia: Secondary | ICD-10-CM

## 2024-05-31 DIAGNOSIS — N3281 Overactive bladder: Secondary | ICD-10-CM | POA: Diagnosis not present

## 2024-05-31 DIAGNOSIS — Z9889 Other specified postprocedural states: Secondary | ICD-10-CM | POA: Diagnosis not present

## 2024-05-31 DIAGNOSIS — R8290 Unspecified abnormal findings in urine: Secondary | ICD-10-CM | POA: Diagnosis present

## 2024-05-31 DIAGNOSIS — Z87440 Personal history of urinary (tract) infections: Secondary | ICD-10-CM

## 2024-05-31 DIAGNOSIS — N952 Postmenopausal atrophic vaginitis: Secondary | ICD-10-CM

## 2024-05-31 DIAGNOSIS — N8110 Cystocele, unspecified: Secondary | ICD-10-CM | POA: Diagnosis not present

## 2024-05-31 DIAGNOSIS — R82998 Other abnormal findings in urine: Secondary | ICD-10-CM | POA: Diagnosis present

## 2024-05-31 DIAGNOSIS — K5900 Constipation, unspecified: Secondary | ICD-10-CM

## 2024-05-31 LAB — URINALYSIS, ROUTINE W REFLEX MICROSCOPIC
Bacteria, UA: NONE SEEN
Bilirubin Urine: NEGATIVE
Glucose, UA: NEGATIVE mg/dL
Ketones, ur: NEGATIVE mg/dL
Nitrite: NEGATIVE
Protein, ur: NEGATIVE mg/dL
Specific Gravity, Urine: 1.008 (ref 1.005–1.030)
pH: 6 (ref 5.0–8.0)

## 2024-05-31 LAB — POCT URINALYSIS DIP (CLINITEK)
Bilirubin, UA: NEGATIVE
Glucose, UA: NEGATIVE mg/dL
Ketones, POC UA: NEGATIVE mg/dL
Nitrite, UA: NEGATIVE
POC PROTEIN,UA: NEGATIVE
Spec Grav, UA: 1.01 (ref 1.010–1.025)
Urobilinogen, UA: 0.2 U/dL
pH, UA: 6.5 (ref 5.0–8.0)

## 2024-05-31 MED ORDER — TROSPIUM CHLORIDE ER 60 MG PO CP24: 1.0000 | ORAL_CAPSULE | Freq: Every day | ORAL | 2 refills | Status: DC

## 2024-05-31 NOTE — Assessment & Plan Note (Signed)
-   pain at vaginal opening with dryness - improved with pelvic floor PT and vaginal estrogen - encouraged lubrication use, barrier ointment use - The origin of pelvic floor muscle spasm can be multifactorial, including primary, reactive to a different pain source, trauma, or even part of a centralized pain syndrome.Treatment options include pelvic floor physical therapy, local (vaginal) or oral muscle relaxants, pelvic muscle trigger point injections or centrally acting pain medications.

## 2024-05-31 NOTE — Patient Instructions (Addendum)
 You have a stage 2 (out of 4) prolapse.  We discussed the fact that it is not life threatening but there are several treatment options. For treatment of pelvic organ prolapse, we discussed options for management including expectant management, conservative management, and surgical management, such as Kegels, a pessary, pelvic floor physical therapy, and specific surgical procedures.     We discussed the symptoms of overactive bladder (OAB), which include urinary urgency, urinary frequency, night-time urination, with or without urge incontinence.  We discussed management including behavioral therapy (decreasing bladder irritants by following a bladder diet, urge suppression strategies, timed voids, bladder retraining), physical therapy, medication; and for refractory cases posterior tibial nerve stimulation, sacral neuromodulation, and intravesical botulinum toxin injection.   For anticholinergic medications, we discussed the potential side effects of anticholinergics including dry eyes, dry mouth, constipation, rare risks of cognitive impairment and urinary retention. You were given prescription for Trospium .  It can take a month to start working so give it time, but if you have bothersome side effects call sooner and we can try a different medication.  Call us  if you have trouble filling the prescription or if it's not covered by your insurance.  For symptomatic vaginal atrophy options include lubrication with a water-based lubricant, personal hygiene measures and barrier protection against wetness, and estrogen replacement in the form of vaginal cream, vaginal tablets, or a time-released vaginal ring.     For vaginal atrophy (thinning of the vaginal tissue that can cause dryness and burning) and UTI prevention we discussed estrogen replacement in the form of vaginal cream.   Continue vaginal estrogen 2 times weekly at night. This can be placed with your finger or an applicator inside the vagina and  around the urethra.  Please let us  know if the prescription is too expensive and we can look for alternative options.   Is vaginal estrogen therapy safe for me? Vaginal estrogen preparations act on the vaginal skin, and only a very tiny amount is absorbed into the bloodstream (0.01%).  They work in a similar way to hand or face cream.  There is minimal absorption and they are therefore perfectly safe. If you have had breast cancer and have persistent troublesome symptoms which aren't settling with vaginal moisturisers and lubricants, local estrogen treatment may be a possibility, but consultation with your oncologist should take place first.   - discussed proper vulvar care, warm compression, avoid pad use, cotton only underwear and barrier ointment if needed  - You can also use vaginal moisturizers such as Replens or Revaree, or Vitamin E suppository as often as desired for irritation and dryness.

## 2024-05-31 NOTE — Assessment & Plan Note (Signed)
-   uses D- mannose - For treatment of recurrent urinary tract infections, we discussed management of recurrent UTIs including prophylaxis with a daily low dose antibiotic, transvaginal estrogen therapy, D-mannose, and cranberry supplements.  We discussed the role of diagnostic testing such as cystoscopy and upper tract imaging.   - continue low dose vaginal estrogen - pending catheterized urine testing, denies UTI symptoms

## 2024-05-31 NOTE — Assessment & Plan Note (Signed)
-   Bristol III-IV stool 3-4x/week with straining, history of diverticulosis - For constipation, we reviewed the importance of a better bowel regimen.  We also discussed the importance of avoiding chronic straining, as it can exacerbate her pelvic floor symptoms; we discussed treating constipation and straining prior to surgery, as postoperative straining can lead to damage to the repair and recurrence of symptoms. We discussed initiating therapy with increasing fluid intake, fiber supplementation, stool softeners, and laxatives such as miralax.  - encouraged squatting position and titration of fiber/miralax supplementation

## 2024-05-31 NOTE — Assessment & Plan Note (Signed)
-   For symptomatic vaginal atrophy options include lubrication with a water-based lubricant, personal hygiene measures and barrier protection against wetness, and estrogen replacement in the form of vaginal cream, vaginal tablets, or a time-released vaginal ring.   - continue low dose vaginal estrogen 0.5g 3x/week - discussed proper vulvar care, warm compression, avoid pad use, cotton only underwear and barrier ointment if needed  - encouraged Vit E suppository, moisturizer with Replens/Revaree

## 2024-05-31 NOTE — Assessment & Plan Note (Addendum)
-   POCT UA + leuk/protein/heme, pending UA microscopy and culture - PVR 2mL, catheterized for - on Vesicare  - We discussed the symptoms of overactive bladder (OAB), which include urinary urgency, urinary frequency, nocturia, with or without urge incontinence.  While we do not know the exact etiology of OAB, several treatment options exist. We discussed management including behavioral therapy (decreasing bladder irritants, urge suppression strategies, timed voids, bladder retraining), physical therapy, medication; for refractory cases posterior tibial nerve stimulation, sacral neuromodulation, and intravesical botulinum toxin injection.  For anticholinergic medications, we discussed the potential side effects of anticholinergics including dry eyes, dry mouth, constipation, cognitive impairment and urinary retention. For Beta-3 agonist medication, we discussed the potential side effect of elevated blood pressure which is more likely to occur in individuals with uncontrolled hypertension. - discontinue Vesicare  and switch to Trospium . Pt to call and signup on [employer] if cost prohibitive - encouraged fluid management, Kegel exercises, and optimization of stool consistency - repeat PVR on Trospium  at follow-up

## 2024-05-31 NOTE — Assessment & Plan Note (Signed)
-   recurrent pelvic organ prolapse for 3 years - h/o cystocele repair by Dr. Janit in 11/10/18 due to prior elevated PVR - For treatment of pelvic organ prolapse, we discussed options for management including expectant management, conservative management, and surgical management, such as Kegels, a pessary, pelvic floor physical therapy, and specific surgical procedures. - continue pelvic floor PT, optimize stool consistency to reduce straining during bowel movements - continue low dose vaginal estrogen due to improvement of vaginal pain

## 2024-05-31 NOTE — Assessment & Plan Note (Addendum)
-   h/o anterior repair by Dr. Dr. Janit in 11/10/18  - TV[facility] by Dr. Norval and pubovaginal sling with suprapubic catheter by Dr. Andra with left ureteral injury. Percutaneous nephrostomy placement and abdominal reimplantation with stent placement in 2000 by Dr. Andra - will need urodynamics prior to surgical intervention - consider renal imaging/Lasix renal scan to r/o ureteral stricture  - Cr 0.54 in 05/08/24

## 2024-05-31 NOTE — Progress Notes (Signed)
 New Patient Evaluation and Consultation  Referring Provider: Almarie Waddell NOVAK, NP PCP: Almarie Waddell NOVAK, NP Date of Service: 05/31/2024  SUBJECTIVE Chief Complaint: New Patient (Initial Visit) Vanessa French is a 73 y.o. female here today for evaluation of pelvic organ prolapse and perineal discomfort.  )  History of Present Illness: Vanessa French is a 73 y.o. White or Caucasian female seen in consultation at the request of NP Almarie for evaluation of pelvic organ prolapse and perineal discomfort.    Reports minimal leakage on Vesicare  and stopped pad use 2-3 years ago Retired from nursing in 2022.  Cherry tomato size vaginal bulge returned around 3 years ago, denies bleeding or discharge. Burning and pressure sensation at vaginal opening.  Started on vaginal estrogen 0.5g 2-3x/week with relief  Most bothered by pelvic pain managed by Tylenol  with relief, reduced since pelvic floor PT Undergoing [facility] pelvic floor PT x 5, recommended fluid management, deep breathing Reports improvement of constipation  Nuswab negative 05/01/24 History of cystocele repair by Dr. Sar in 11/10/18 due to prior elevated PVR with rUTI improved with cranberry pills, probiotics, and increased water intake and stage III pelvic organ prolapse in 2019. Postop PVR 0mL with urinary urgency, discussed Botox/interstim due to lack of relief from OAB medications preop. Continued Vesicare . Recommended vaginal estrogen use by Dr. Sande in 03/14/19, stopped after around 6 months.  Evaluated by Dr. Sar 08/29/18 after TVH by Dr. Norval and pubovaginal sling with suprapubic catheter by Dr. Andra with left ureteral injury with left flank pain that required percutaneous nephrostomy placement and abdominal reimplantation with stent placement in 2000 by Dr. Andra.   OAB on Vesicare  5mg , previously on mirabegron with dry mouth and stopped due to cost UDS 08/30/18: - uroflow voided 14mL, PVR . Qmax 4.59mL/sec - CMG  1st urge , normal urge 312m, strong urge , MCC 617mL. No DO or SUI with catheter removed. Normal compliance. Valsalva Voided with catheter expelled, Qmax 10.33mL/sec. Dyssynergic EMG. Post Procedure Uroflow:  Patient voided 355cc. Peak urinary flow rate was: 12.9cc/sec.  The pattern was: abnormal (pt. Reports the need to bend forward and push to empty) PVR= 0cc  Evaluated by [facility] in the past Hydrochlorothiazide  in the morning Pre-DM with HbA1C 5.9 in 05/01/24 History of Psoriasis  Frequent falls, last 08/2023 HbA1C 5.9 in 05/01/24   Review of records significant for: PE post-COVID in 2022 on 81mg  ASA, frequent UTIs, HTN retinopathy, lumbar compression fracture, h/o cataract surgery  CLINICAL DATA:  Fall.  Abnormal pelvic x-ray   EXAM: CT PELVIS WITHOUT CONTRAST   TECHNIQUE: Multidetector CT imaging of the pelvis was performed following the standard protocol without intravenous contrast.   RADIATION DOSE REDUCTION: This exam was performed according to the departmental dose-optimization program which includes automated exposure control, adjustment of the mA and/or kV according to patient size and/or use of iterative reconstruction technique.   COMPARISON:  Same-day pelvic x-ray   FINDINGS: Urinary Tract:  No abnormality visualized.   Bowel: Sigmoid diverticulosis. No evidence of bowel obstruction or active bowel inflammation within the pelvis.   Vascular/Lymphatic: Scattered atherosclerotic calcifications. No intrapelvic or inguinal lymphadenopathy.   Reproductive:  Prior hysterectomy.  No adnexal masses.   Other: Intrapelvic hematoma adjacent to the left superior pubic ramus fracture with adjacent more ill-defined intrapelvic hemorrhage, in total measuring approximately 8.6 x 2.1 x 5.7 cm.   Musculoskeletal: Acute comminuted fracture of the left superior pubic ramus with mild displacement. Minimally displaced fracture of the  mid left inferior  pubic ramus. Asymmetric enlargement of the left obturator externus muscle, likely a component of intramuscular hemorrhage. Pubic symphysis intact without diastasis. Bilateral sacroiliac joints are intact without diastasis. Nondisplaced fracture of the sacrum at the S4 level appears chronic (series 7, image 116). No additional pelvic fractures are seen. Bilateral hip joints are intact without fracture or dislocation.   IMPRESSION: 1. Acute mildly displaced fractures of the left superior and inferior pubic rami. 2. Intrapelvic hematoma adjacent to the left superior pubic ramus fracture with adjacent more ill-defined intrapelvic hemorrhage, in total measuring approximately 8.6 x 2.1 x 5.7 cm. 3. Asymmetric enlargement of the left obturator externus muscle, likely a component of intramuscular hemorrhage. 4. Nondisplaced fracture of the sacrum at the S4 level appears chronic. Correlate with point tenderness.     Electronically Signed   By: Mabel Converse D.O.   On: 09/20/2022 20:57  Urinary Symptoms: Does not leak urine.   Day time voids 6-7.  Nocturia: 0-1 times per night to void. Voiding dysfunction:  empties bladder well.  Patient does not use a catheter to empty bladder.  When urinating, patient feels to push on her belly or vagina to empty bladder Drinks: 80oz water per day, 12oz coffee, 6oz of diet coke  UTIs: 0 UTI's in the last year.   Denies history of blood in urine, kidney or bladder stones, pyelonephritis, bladder cancer, and kidney cancer No results found for the last 90 days.   Pelvic Organ Prolapse Symptoms:                  Patient Admits to a feeling of a bulge the vaginal area. It has been present for 3-4 years.  Patient Admits to seeing a bulge. Reports increased pain in the past 6 months This bulge is bothersome.  Bowel Symptom: Bowel movements: 3-4 time(s) per week with diverticulosis Stool consistency: hard, Bristol III-IV stool, Bristol I stool  1x/month Straining: yes, reduced since position changes recommended by PT Splinting: yes, described as pushing on her buttock Incomplete evacuation: no.  Patient Denies accidental bowel leakage / fecal incontinence Bowel regimen: stool softener and prunes PRN Last colonoscopy: Results diverticulosis, repeat in 5 years [facility] Colonoscopy          Current Care Gaps     Colonoscopy (Every 5 Years) Due soon on 08/26/2024    08/27/2019  [facility] Colonoscopy component of [facility] COLONOSCOPY   Only the first 1 history entries have been loaded, but more history exists.                Sexual Function Sexually active: no due to vaginal pain Sexual orientation: Straight Pain with sex: Yes, at the vaginal opening, has discomfort due to dryness  Pelvic Pain Denies pelvic pain   Past Medical History:  Past Medical History:  Diagnosis Date   Acute pulmonary embolism [facility]) Post Covid - July 2022 05/14/2021   Cataract 2014   Removed   Closed fracture of right proximal humerus 04/07/2012   Diverticulosis of colon 02/14/2020   Frequent UTI    Ganglion cyst of wrist, left 08/11/2021   GERD (gastroesophageal reflux disease)    History of pulmonary embolus (PE) 11/03/2012   Hyperlipidemia    Hypertension    Left ureteral injury 08/29/2018   Prolapse of anterior vaginal wall    Psoriasis    Thoracic compression fracture [facility])      Past Surgical History:   Past Surgical History:  Procedure Laterality Date  ABDOMINAL HYSTERECTOMY  2000   ANTERIOR AND POSTERIOR VAGINAL REPAIR     APPENDECTOMY  1959   BLADDER SUSPENSION  11/1998   BLADDER SUSPENSION     BREAST CYST ASPIRATION Right 2011   CATARACT EXTRACTION, BILATERAL Bilateral 2016   Dr. Charmayne MASH REPAIR  11/10/2018   Dr. Lamar Sar, [facility]   CYSTOCELE REPAIR  11/10/2018   Dr. Lamar Sar   EXTENSOR TENDON OF FOREARM / WRIST REPAIR Left 2010   LEFT ARM   EYE SURGERY  2014   Cataract surgery   FRACTURE SURGERY  2013    Right humorous   GANGLION CYST EXCISION Left 09/29/2021   Procedure: EXCISION VOLAR CYST LEFT WRIST RECURRENT;  Surgeon: Murrell Kuba, MD;  Location: [facility];  Service: Orthopedics;  Laterality: Left;  regional with MAC   ORIF HUMERUS FRACTURE  04/07/2012   Procedure: OPEN REDUCTION INTERNAL FIXATION (ORIF) PROXIMAL HUMERUS FRACTURE;  Surgeon: Oneil JAYSON Herald, MD;  Location: [facility] OR;  Service: Orthopedics;  Laterality: Right;  Open Reduction Internal Fixation Right Proxmial Humerus   STERIOD INJECTION  04/07/2012   Procedure: STEROID INJECTION;  Surgeon: Oneil JAYSON Herald, MD;  Location: [facility] OR;  Service: Orthopedics;  Laterality: Right;   Right 1st Dorsal Compartment Injection   TONSILLECTOMY  1962   URETER REVISION Left 11/3003   COMPLIATION OF BLADDER SLING   vaginal hysterectomy with unilateral salpingooopherectomy  2000     Past OB/GYN History: OB History  Gravida Para Term Preterm AB Living  3 3 3   3   SAB IAB Ectopic Multiple Live Births      3    # Outcome Date GA Lbr Len/2nd Weight Sex Type Anes PTL Lv  3 Term     M    LIV  2 Term     M Vag-Spont   LIV  1 Term     M Vag-Spont   LIV    Vaginal deliveries: largest infant 9lb5oz,  Forceps/ Vacuum deliveries: 1 forceps, Cesarean section: 0 Menopausal: Yes, at age 2, Denies vaginal bleeding since menopause Contraception: TVH with USO at 46. Last pap smear was 2020 per pt, no results for review.  Any history of abnormal pap smears: no. No results found for: DIAGPAP, HPVHIGH, ADEQPAP  Medications: Patient has a current medication list which includes the following prescription(s): aspirin  ec, calcipotriene, calcium  carbonate, cetirizine, vitamin d -3, cranberry, vitamin b-12, denosumab, desonide, duloxetine , estradiol , faricimab -svoa, hydrochlorothiazide , meclizine , omeprazole , pseudoephedrine , rosuvastatin , triamcinolone  cream, and trospium  chloride.   Allergies: Patient is allergic to penicillins and latex.   Social  History:  Social History   Tobacco Use   Smoking status: Never   Smokeless tobacco: Never  Vaping Use   Vaping status: Never Used  Substance Use Topics   Alcohol use: No   Drug use: No    Relationship status: married Patient lives with her husband.   Patient is not employed. Retired [employer] fundraiser Regular exercise: Yes: gym > 3x/week and attends exercise classes 2x/wk History of abuse: No  Family History:   Family History  Problem Relation Age of Onset   Heart disease Mother    Arthritis Mother    Hyperlipidemia Mother    Hypertension Mother    Colon cancer Father 76   Early death Father    Atrial fibrillation Sister    Stroke Maternal Grandmother    Heart disease Maternal Grandfather    Heart failure Maternal Grandfather 3   Kidney Stones Son  Diabetes Son    Colon cancer Maternal Uncle    Bladder Cancer Neg Hx    Renal cancer Neg Hx    Uterine cancer Neg Hx      Review of Systems: Review of Systems  Constitutional:  Negative for fever, malaise/fatigue and weight loss.  Respiratory:  Negative for cough, shortness of breath and wheezing.   Cardiovascular:  Negative for chest pain, palpitations and leg swelling.  Gastrointestinal:  Positive for constipation. Negative for abdominal pain and blood in stool.  Genitourinary:  Positive for dysuria. Negative for frequency, hematuria and urgency.       Bulge  Skin:  Negative for rash.  Neurological:  Negative for dizziness, weakness and headaches.  Endo/Heme/Allergies:  Bruises/bleeds easily.  Psychiatric/Behavioral:  Negative for depression. The patient is not nervous/anxious.      OBJECTIVE Physical Exam: Vitals:   05/31/24 1403  BP: 118/80  Pulse: 88  Weight: 171 lb 12.8 oz (77.9 kg)  Height: 5' 2.21 (1.58 m)    Physical Exam Constitutional:      General: She is not in acute distress.    Appearance: Normal appearance.  Genitourinary:     Bladder and urethral meatus normal.     No lesions in the vagina.      Right Labia: No rash, tenderness, lesions, skin changes or Bartholin's cyst.    Left Labia: No tenderness, lesions, skin changes, Bartholin's cyst or rash.    No vaginal discharge, erythema, tenderness, bleeding, ulceration or granulation tissue.     Anterior vaginal prolapse present.    Moderate vaginal atrophy present.     Right Adnexa: not tender, not full and no mass present.    Left Adnexa: not tender, not full and no mass present.    Cervix is absent.     Uterus is absent.     Urethral meatus caruncle not present.    No urethral prolapse, tenderness, mass, hypermobility, discharge or stress urinary incontinence with cough stress test present.     Bladder is not tender, urgency on palpation not present and masses not present.      Pelvic Floor: Levator muscle strength is 2/5.    Levator ani not tender, obturator internus not tender, no asymmetrical contractions present and no pelvic spasms present.    Anal wink absent and BC reflex absent.     Symmetrical pelvic sensation. Cardiovascular:     Rate and Rhythm: Normal rate.  Pulmonary:     Effort: Pulmonary effort is normal. No respiratory distress.  Abdominal:     General: There is no distension.     Palpations: Abdomen is soft. There is no mass.     Tenderness: There is no abdominal tenderness.     Hernia: No hernia is present.   Neurological:     Mental Status: She is alert.  Vitals reviewed. Exam conducted with a chaperone present.      POP-Q:   POP-Q  0                                            Aa   0                                           Ba  -5  C   4                                            Gh  3                                            Pb  6                                            tvl   -2                                            Ap  -2                                            Bp                                                 D       Post-Void Residual (PVR) by Bladder Scan: In order to evaluate bladder emptying, we discussed obtaining a postvoid residual and patient agreed to this procedure.  Procedure: The ultrasound unit was placed on the patient's abdomen in the suprapubic region after the patient had voided.    Post Void Residual - 05/31/24 1431       Post Void Residual   Post Void Residual 2 mL         Straight Catheterization Procedure for PVR: After verbal consent was obtained from the patient for catheterization to assess bladder emptying and residual volume the urethra and surrounding tissues were prepped with betadine and an in and out catheterization was performed.  PVR was .  Urine appeared clear yellow. The patient tolerated the procedure well.    Laboratory Results: Lab Results  Component Value Date   COLORU yellow 05/31/2024   CLARITYU clear 05/31/2024   GLUCOSEUR negative 05/31/2024   BILIRUBINUR negative 05/31/2024   KETONESU negative 05/01/2024   SPECGRAV 1.010 05/31/2024   RBCUR trace-intact (A) 05/31/2024   PHUR 6.5 05/31/2024   PROTEINUR Positive (A) 05/01/2024   UROBILINOGEN 0.2 05/31/2024   LEUKOCYTESUR Trace (A) 05/31/2024    Lab Results  Component Value Date   CREATININE 0.54 05/08/2024   CREATININE 0.67 05/01/2024   CREATININE 0.64 11/22/2023    Lab Results  Component Value Date   HGBA1C 5.9 05/01/2024    Lab Results  Component Value Date   HGB 15.7 (H) 05/01/2024     ASSESSMENT AND PLAN Ms. Wooton is a 73 y.o. with:  1. OAB (overactive bladder)   2. Female cystocele   3. History of pelvic surgery   4. History of recurrent UTI (urinary tract infection)   5. Vaginal atrophy   6. Abnormal urinalysis   7. Constipation, unspecified constipation type  8. Dyspareunia, female     OAB (overactive bladder) Assessment & Plan: - POCT UA + leuk/protein/heme, pending UA microscopy and culture - PVR 2mL, catheterized for - on Vesicare  - We  discussed the symptoms of overactive bladder (OAB), which include urinary urgency, urinary frequency, nocturia, with or without urge incontinence.  While we do not know the exact etiology of OAB, several treatment options exist. We discussed management including behavioral therapy (decreasing bladder irritants, urge suppression strategies, timed voids, bladder retraining), physical therapy, medication; for refractory cases posterior tibial nerve stimulation, sacral neuromodulation, and intravesical botulinum toxin injection.  For anticholinergic medications, we discussed the potential side effects of anticholinergics including dry eyes, dry mouth, constipation, cognitive impairment and urinary retention. For Beta-3 agonist medication, we discussed the potential side effect of elevated blood pressure which is more likely to occur in individuals with uncontrolled hypertension. - discontinue Vesicare  and switch to Trospium . Pt to call and signup on [employer] if cost prohibitive - encouraged fluid management, Kegel exercises, and optimization of stool consistency - repeat PVR on Trospium  at follow-up  Orders: -     POCT URINALYSIS DIP (CLINITEK) -     Trospium  Chloride ER; Take 1 capsule (60 mg total) by mouth daily.  Dispense: 30 capsule; Refill: 2  Female cystocele Assessment & Plan: - recurrent pelvic organ prolapse for 3 years - h/o cystocele repair by Dr. Janit in 11/10/18 due to prior elevated PVR - For treatment of pelvic organ prolapse, we discussed options for management including expectant management, conservative management, and surgical management, such as Kegels, a pessary, pelvic floor physical therapy, and specific surgical procedures. - continue pelvic floor PT, optimize stool consistency to reduce straining during bowel movements - continue low dose vaginal estrogen due to improvement of vaginal pain   History of pelvic surgery Assessment & Plan: - h/o anterior repair by Dr.  Dr. Janit in 11/10/18  - TVH by Dr. Norval and pubovaginal sling with suprapubic catheter by Dr. Andra with left ureteral injury. Percutaneous nephrostomy placement and abdominal reimplantation with stent placement in 2000 by Dr. Andra - will need urodynamics prior to surgical intervention - consider renal imaging/Lasix renal scan to r/o ureteral stricture  - Cr 0.54 in 05/08/24   History of recurrent UTI (urinary tract infection) Assessment & Plan: - uses D- mannose - For treatment of recurrent urinary tract infections, we discussed management of recurrent UTIs including prophylaxis with a daily low dose antibiotic, transvaginal estrogen therapy, D-mannose, and cranberry supplements.  We discussed the role of diagnostic testing such as cystoscopy and upper tract imaging.   - continue low dose vaginal estrogen - pending catheterized urine testing, denies UTI symptoms    Vaginal atrophy Assessment & Plan: - For symptomatic vaginal atrophy options include lubrication with a water-based lubricant, personal hygiene measures and barrier protection against wetness, and estrogen replacement in the form of vaginal cream, vaginal tablets, or a time-released vaginal ring.   - continue low dose vaginal estrogen 0.5g 3x/week - discussed proper vulvar care, warm compression, avoid pad use, cotton only underwear and barrier ointment if needed  - encouraged Vit E suppository, moisturizer with Replens/Revaree   Abnormal urinalysis -     Urine Microscopic; Future -     Urine Culture; Future  Constipation, unspecified constipation type Assessment & Plan: - Bristol III-IV stool 3-4x/week with straining, history of diverticulosis - For constipation, we reviewed the importance of a better bowel regimen.  We also discussed the importance of avoiding chronic  straining, as it can exacerbate her pelvic floor symptoms; we discussed treating constipation and straining prior to surgery, as postoperative  straining can lead to damage to the repair and recurrence of symptoms. We discussed initiating therapy with increasing fluid intake, fiber supplementation, stool softeners, and laxatives such as miralax.  - encouraged squatting position and titration of fiber/miralax supplementation   Dyspareunia, female Assessment & Plan: - pain at vaginal opening with dryness - improved with pelvic floor PT and vaginal estrogen - encouraged lubrication use, barrier ointment use - The origin of pelvic floor muscle spasm can be multifactorial, including primary, reactive to a different pain source, trauma, or even part of a centralized pain syndrome.Treatment options include pelvic floor physical therapy, local (vaginal) or oral muscle relaxants, pelvic muscle trigger point injections or centrally acting pain medications.     Time spent: I spent 64 minutes dedicated to the care of this patient on the date of this encounter to include pre-visit review of records, face-to-face time with the patient discussing recurrent stage II pelvic organ prolapse, OAB, history of pelvic surgery, history of rUTI, abnormal urinalysis, constipation, dyspareunia, and post visit documentation and ordering medication/ testing.   Lianne ONEIDA Gillis, MD

## 2024-06-01 ENCOUNTER — Ambulatory Visit: Payer: Self-pay | Admitting: Obstetrics

## 2024-06-01 LAB — URINE CULTURE: Culture: NO GROWTH

## 2024-07-09 NOTE — Progress Notes (Signed)
 [name]

## 2024-07-31 ENCOUNTER — Encounter: Payer: Self-pay | Admitting: Physician Assistant

## 2024-08-01 LAB — HM COLONOSCOPY

## 2024-08-02 ENCOUNTER — Encounter: Payer: Self-pay | Admitting: Neurology

## 2024-08-03 ENCOUNTER — Ambulatory Visit: Admitting: Physician Assistant

## 2024-08-03 ENCOUNTER — Encounter: Payer: Self-pay | Admitting: Physician Assistant

## 2024-08-03 DIAGNOSIS — M810 Age-related osteoporosis without current pathological fracture: Secondary | ICD-10-CM

## 2024-08-03 MED ORDER — DENOSUMAB 60 MG/ML ~~LOC~~ SOSY
60.0000 mg | PREFILLED_SYRINGE | Freq: Once | SUBCUTANEOUS | Status: AC
  Administered 2024-08-03: 60 mg via SUBCUTANEOUS

## 2024-08-03 NOTE — Progress Notes (Signed)
 Office Visit Note   Patient: Vanessa French           Date of Birth: [birth year]           MRN: [id number] Visit Date: 08/03/2024              Requested by: Almarie Waddell NOVAK, NP [address] [place],  [place] [address] PCP: Almarie Waddell NOVAK, NP      HPI: Patient comes in for her second Prolia shot today.  No complaints  Assessment & Plan: Visit Diagnoses:  1. Age-related osteoporosis without current pathological fracture     Plan: Will go forward with subcu injection today tolerated it well.  Will also draw B med calcium  and vitamin D .  She will follow-up in 6 months and 1 day.  At that time we will order a new bone density scan and new labs.  Patient has had no trouble tolerating the medication she denies cramping she denies any paresthesias in her hands or feet.  Her calcium  in July was 9.1.  She does take 2 Tums daily and has been doing this for years Lot [id number] Exp 23 Dec 2026 Subcu injection upper left arm Follow-Up Instructions: 6 months 1 day  Ortho Exam  Patient is alert, oriented, no adenopathy, well-dressed, normal affect, normal respiratory effort.     Imaging: No results found. No images are attached to the encounter.  Labs: Lab Results  Component Value Date   HGBA1C 5.9 05/01/2024   HGBA1C 5.6 11/22/2023   HGBA1C 5.9 (H) 08/22/2023   ESRSEDRATE 6 06/30/2017   LABURIC 5.6 06/30/2017   REPTSTATUS 06/01/2024 FINAL 05/31/2024   CULT  05/31/2024    NO GROWTH Performed at [facility], [address] [place], [place] [address]    IDOLINA  01/10/2017    Multiple organisms present,each less than 10,000 CFU/mL. These organisms,commonly found on external and internal genitalia,are considered colonizers. No further testing performed.      Lab Results  Component Value Date   ALBUMIN 4.2 05/01/2024   ALBUMIN 4.1 11/30/2016   ALBUMIN 4.0 08/13/2016    Lab Results  Component Value Date   MG 1.9 08/22/2023   MG 2.1  02/17/2023   MG 2.1 08/11/2022   Lab Results  Component Value Date   VD25OH 86.59 05/01/2024   VD25OH 71 08/22/2023   VD25OH 74 08/11/2022    No results found for: PREALBUMIN    Latest Ref Rng & Units 05/01/2024   12:20 PM 11/22/2023   11:16 AM 08/22/2023   10:58 AM  CBC EXTENDED  WBC 4.0 - 10.5 K/uL 7.4  7.9  7.2   RBC 3.87 - 5.11 Mil/uL 5.21  5.41  5.14   Hemoglobin 12.0 - 15.0 g/dL 84.2  83.4  84.2   HCT 36.0 - 46.0 % 47.5  49.5  47.1   Platelets 150.0 - 400.0 K/uL 191.0  217  242   NEUT# 1.4 - 7.7 K/uL 4.7  5,222  4,810   Lymph# 0.7 - 4.0 K/uL 2.0        There is no height or weight on file to calculate BMI.  Orders:  No orders of the defined types were placed in this encounter.  No orders of the defined types were placed in this encounter.    Procedures: No procedures performed  Clinical Data: No additional findings.  ROS:  All other systems negative, except as noted in the HPI. Review of Systems  Objective: Vital Signs: There were no vitals taken for this visit.  Specialty Comments:  No specialty comments available.  PMFS History: Patient Active Problem List   Diagnosis Date Noted   History of pelvic surgery 05/31/2024   History of recurrent UTI (urinary tract infection) 05/31/2024   Vaginal atrophy 05/31/2024   Abnormal urinalysis 05/31/2024   Constipation 05/31/2024   Dyspareunia, female 05/31/2024   Hypertensive retinopathy 05/01/2024   Lumbar compression fracture (HCC) 07/12/2023   Macular degeneration of both eyes 08/11/2021   Aortic atherosclerosis (HCC)- CT 04/2021 05/14/2021   B12 deficiency 08/07/2020   Thoracic compression fracture (HCC) 07/30/2020   Essential hypertension 12/21/2018   Female cystocele 09/05/2018   Prolapse of female genital organs 08/29/2018   GERD (gastroesophageal reflux disease) 07/13/2018   Psoriasis 12/15/2017   Hyperlipidemia 12/14/2017   Vitamin D  deficiency 12/14/2017   Age-related osteoporosis without  current pathological fracture 12/14/2017   OAB (overactive bladder) 11/30/2016   History of prediabetes 11/30/2016   Anxiety 11/03/2012   Past Medical History:  Diagnosis Date   Acute pulmonary embolism [facility]) Post Covid - July 2022 05/14/2021   Cataract 2014   Removed   Closed fracture of right proximal humerus 04/07/2012   Diverticulosis of colon 02/14/2020   Frequent UTI    Ganglion cyst of wrist, left 08/11/2021   GERD (gastroesophageal reflux disease)    History of pulmonary embolus (PE) 11/03/2012   Hyperlipidemia    Hypertension    Left ureteral injury 08/29/2018   Prolapse of anterior vaginal wall    Psoriasis    Thoracic compression fracture (HCC)     Family History  Problem Relation Age of Onset   Heart disease Mother    Arthritis Mother    Hyperlipidemia Mother    Hypertension Mother    Colon cancer Father 82   Early death Father    Atrial fibrillation Sister    Stroke Maternal Grandmother    Heart disease Maternal Grandfather    Heart failure Maternal Grandfather 52   Kidney Stones Son    Diabetes Son    Colon cancer Maternal Uncle    Bladder Cancer Neg Hx    Renal cancer Neg Hx    Uterine cancer Neg Hx     Past Surgical History:  Procedure Laterality Date   ABDOMINAL HYSTERECTOMY  2000   ANTERIOR AND POSTERIOR VAGINAL REPAIR     APPENDECTOMY  1959   BLADDER SUSPENSION  11/1998   BLADDER SUSPENSION     BREAST CYST ASPIRATION Right 2011   CATARACT EXTRACTION, BILATERAL Bilateral 2016   Dr. Charmayne MASH REPAIR  11/10/2018   Dr. Lamar Sar, [facility]   CYSTOCELE REPAIR  11/10/2018   Dr. Lamar Sar   EXTENSOR TENDON OF FOREARM / WRIST REPAIR Left 2010   LEFT ARM   EYE SURGERY  2014   Cataract surgery   FRACTURE SURGERY  2013   Right humorous   GANGLION CYST EXCISION Left 09/29/2021   Procedure: EXCISION VOLAR CYST LEFT WRIST RECURRENT;  Surgeon: Murrell Kuba, MD;  Location: [facility];  Service: Orthopedics;  Laterality: Left;   regional with MAC   ORIF HUMERUS FRACTURE  04/07/2012   Procedure: OPEN REDUCTION INTERNAL FIXATION (ORIF) PROXIMAL HUMERUS FRACTURE;  Surgeon: Oneil JAYSON Herald, MD;  Location: [facility] OR;  Service: Orthopedics;  Laterality: Right;  Open Reduction Internal Fixation Right Proxmial Humerus   STERIOD INJECTION  04/07/2012   Procedure: STEROID INJECTION;  Surgeon: Oneil JAYSON Herald, MD;  Location: [facility] OR;  Service: Orthopedics;  Laterality: Right;   Right 1st Dorsal Compartment Injection   TONSILLECTOMY  1962   URETER REVISION Left 11/3003   COMPLIATION OF BLADDER SLING   vaginal hysterectomy with unilateral salpingooopherectomy  2000   Social History   Occupational History   Occupation: Teacher, adult education: [employer]  Tobacco Use   Smoking status: Never   Smokeless tobacco: Never  Vaping Use   Vaping status: Never Used  Substance and Sexual Activity   Alcohol use: No   Drug use: No   Sexual activity: Yes    Partners: Male    Birth control/protection: Post-menopausal

## 2024-08-04 LAB — VITAMIN D 25 HYDROXY (VIT D DEFICIENCY, FRACTURES): Vit D, 25-Hydroxy: 68 ng/mL (ref 30–100)

## 2024-08-04 LAB — BASIC METABOLIC PANEL WITH GFR
BUN/Creatinine Ratio: 18 (calc) (ref 6–22)
BUN: 9 mg/dL (ref 7–25)
CO2: 32 mmol/L (ref 20–32)
Calcium: 9.4 mg/dL (ref 8.6–10.4)
Chloride: 97 mmol/L — ABNORMAL LOW (ref 98–110)
Creat: 0.5 mg/dL — ABNORMAL LOW (ref 0.60–1.00)
Glucose, Bld: 84 mg/dL (ref 65–99)
Potassium: 3.6 mmol/L (ref 3.5–5.3)
Sodium: 138 mmol/L (ref 135–146)
eGFR: 100 mL/min/1.73m2 (ref >=60)

## 2024-08-07 ENCOUNTER — Other Ambulatory Visit (HOSPITAL_BASED_OUTPATIENT_CLINIC_OR_DEPARTMENT_OTHER): Payer: Self-pay

## 2024-08-07 MED ORDER — FLUZONE HIGH-DOSE 0.5 ML IM SUSY
0.5000 mL | PREFILLED_SYRINGE | Freq: Once | INTRAMUSCULAR | 0 refills | Status: AC
  Filled 2024-08-07: qty 0.5, 1d supply, fill #0

## 2024-08-16 ENCOUNTER — Other Ambulatory Visit (HOSPITAL_BASED_OUTPATIENT_CLINIC_OR_DEPARTMENT_OTHER): Payer: Self-pay

## 2024-08-16 MED ORDER — COMIRNATY 30 MCG/0.3ML IM SUSY
0.3000 mL | PREFILLED_SYRINGE | Freq: Once | INTRAMUSCULAR | 0 refills | Status: AC
  Filled 2024-08-16: qty 0.3, 1d supply, fill #0

## 2024-08-21 ENCOUNTER — Encounter: Payer: Medicare HMO | Admitting: Nurse Practitioner

## 2024-08-22 ENCOUNTER — Other Ambulatory Visit: Payer: Self-pay | Admitting: Obstetrics

## 2024-08-22 DIAGNOSIS — N3281 Overactive bladder: Secondary | ICD-10-CM

## 2024-08-23 ENCOUNTER — Other Ambulatory Visit: Payer: Self-pay

## 2024-08-23 DIAGNOSIS — R609 Edema, unspecified: Secondary | ICD-10-CM

## 2024-08-23 DIAGNOSIS — Z0001 Encounter for general adult medical examination with abnormal findings: Secondary | ICD-10-CM

## 2024-08-23 DIAGNOSIS — Z79899 Other long term (current) drug therapy: Secondary | ICD-10-CM

## 2024-08-23 MED ORDER — HYDROCHLOROTHIAZIDE 25 MG PO TABS: ORAL_TABLET | ORAL | 3 refills | Status: AC

## 2024-08-27 ENCOUNTER — Encounter: Payer: Self-pay | Admitting: Radiology

## 2024-08-31 ENCOUNTER — Encounter: Payer: Self-pay | Admitting: Obstetrics

## 2024-08-31 ENCOUNTER — Ambulatory Visit: Admitting: Obstetrics

## 2024-08-31 VITALS — BP 119/82 | HR 97

## 2024-08-31 DIAGNOSIS — Z87440 Personal history of urinary (tract) infections: Secondary | ICD-10-CM

## 2024-08-31 DIAGNOSIS — N952 Postmenopausal atrophic vaginitis: Secondary | ICD-10-CM | POA: Diagnosis not present

## 2024-08-31 DIAGNOSIS — N3281 Overactive bladder: Secondary | ICD-10-CM

## 2024-08-31 DIAGNOSIS — N8110 Cystocele, unspecified: Secondary | ICD-10-CM | POA: Diagnosis not present

## 2024-08-31 DIAGNOSIS — R8290 Unspecified abnormal findings in urine: Secondary | ICD-10-CM

## 2024-08-31 DIAGNOSIS — Z9889 Other specified postprocedural states: Secondary | ICD-10-CM

## 2024-08-31 DIAGNOSIS — K5900 Constipation, unspecified: Secondary | ICD-10-CM

## 2024-08-31 NOTE — Progress Notes (Signed)
 [facility] Return Visit  SUBJECTIVE  History of Present Illness: Vanessa French is a 73 y.o. female seen in follow-up for stage II pelvic organ prolapse, OAB, history of pelvic surgery (midurethral sling), history of rUTI, abnormal urinalysis, constipation, and dyspareunia. Plan at last visit was continue pelvic floor PT exercises, vaginal estrogen, titration of fiber supplementation, and Trospium .   Using Trospium  at night without getting up at night or leakage. Denies sensation of incomplete emptying Bowel movements 4-6x/week with increased  with increased straining with vaginal bulge symptoms. Desires trial of pessary  Uses miralax daily except Sundays at church, however reports Bristol I stool x 3 this morning.  Colonoscopy 08/01/24 with diverticulosis  Vaginal estrogen 1g 3x/week. Denies vaginal bleeding or discharge.   Past Medical History: Patient  has a past medical history of Acute pulmonary embolism [facility]) Post Covid - July 2022 (05/14/2021), Cataract (2014), Closed fracture of right proximal humerus (04/07/2012), Diverticulosis of colon (02/14/2020), Frequent UTI, Ganglion cyst of wrist, left (08/11/2021), GERD (gastroesophageal reflux disease), History of pulmonary embolus (PE) (11/03/2012), Hyperlipidemia, Hypertension, Left ureteral injury (08/29/2018), Prolapse of anterior vaginal wall, Psoriasis, and Thoracic compression fracture (HCC).   Past Surgical History: She  has a past surgical history that includes Tonsillectomy (1962); Appendectomy (1959); Bladder suspension (11/1998); Ureter revision (Left, 11/3003); Extensor tendon of forearm / wrist repair (Left, 2010); ORIF humerus fracture (04/07/2012); Steriod injection (04/07/2012); Breast cyst aspiration (Right, 2011); vaginal hysterectomy with unilateral salpingooopherectomy (2000); Cataract extraction, bilateral (Bilateral, 2016); Cystocele repair (11/10/2018); Cystocele repair (11/10/2018); Ganglion cyst excision  (Left, 09/29/2021); Abdominal hysterectomy (2000); Fracture surgery (2013); Eye surgery (2014); Anterior and posterior vaginal repair; and Bladder suspension.   Medications: She has a current medication list which includes the following prescription(s): aspirin  ec, calcipotriene, calcium  carbonate, cetirizine, vitamin d -3, cranberry, denosumab, desonide, duloxetine , estradiol , faricimab -svoa, hydrochlorothiazide , meclizine , omeprazole , polyethylene glycol powder, pseudoephedrine , rosuvastatin , triamcinolone  cream, and trospium  chloride.   Allergies: Patient is allergic to penicillins and latex.   Social History: Patient  reports that she has never smoked. She has never used smokeless tobacco. She reports that she does not drink alcohol and does not use drugs.     OBJECTIVE     Physical Exam: Vitals:   08/31/24 1230  BP: 119/82  Pulse: 97   Gen: No apparent distress, A&O x 3.  A size 3 ring with support pessary was placed. It was removed due to discomfort A size 1 cube pessary was placed. It was removed due to discomfort with descent to perineum during valsalva A size 2 cube pessary was placed. It was removed due to discomfort with descent to perineum during valsalva A size 3 cube pessary was placed. It was removed due to discomfort     ASSESSMENT AND PLAN    Vanessa French is a 73 y.o. with:  1. Female cystocele   2. Constipation, unspecified constipation type   3. Vaginal atrophy   4. History of recurrent UTI (urinary tract infection)   5. History of pelvic surgery   6. OAB (overactive bladder)   7. Abnormal urinalysis     Female cystocele Assessment & Plan: - recurrent pelvic organ prolapse for 3 years - h/o cystocele repair by Dr. Sar in 11/10/18 due to prior elevated PVR - For treatment of pelvic organ prolapse, we discussed options for management including expectant management, conservative management, and surgical management, such as Kegels, a pessary, pelvic  floor physical therapy, and specific surgical procedures. - continue pelvic floor PT exercises -  continue to increase miralax dose to optimize stool consistency to reduce straining during bowel movements - continue low dose vaginal estrogen due to improvement of vaginal pain - failed size 3 ring with support, size 1, 2, and 3 cube pessaries due to expulsion or discomfort. Patient desires to return to try space occupying pessaries but declined today due to pending trip to [place] for 10 days. We discussed two options for prolapse repair:  1) vaginal repair without mesh - Pros - safer, no mesh complications - Cons - not as strong as mesh repair, higher risk of recurrence  2) laparoscopic repair with mesh - Pros - stronger, better long-term success - Cons - risks of mesh implant (erosion into vagina or bladder, adhering to the rectum, pain) - these risks are lower than with a vaginal mesh but still exist  3) Vaginal closure procedure - pros - strong, good long-term success - cons - unable to have penetrative intercourse - patient considering native tissue options and unsure about vaginal closure. - difficult pessary fitting due to shortened TVL and atrophy   Constipation, unspecified constipation type Assessment & Plan: - Bristol III-IV stool 3-4x/week with straining increased to 4-5x/week since daily miralax after starting Trospium  - history of diverticulosis - For constipation, we reviewed the importance of a better bowel regimen.  We also discussed the importance of avoiding chronic straining, as it can exacerbate her pelvic floor symptoms; we discussed treating constipation and straining prior to surgery, as postoperative straining can lead to damage to the repair and recurrence of symptoms. We discussed initiating therapy with increasing fluid intake, fiber supplementation, stool softeners, and laxatives such as miralax.  - encouraged squatting position and continue titration of  fiber/miralax supplementation   Vaginal atrophy Assessment & Plan: - For symptomatic vaginal atrophy options include lubrication with a water-based lubricant, personal hygiene measures and barrier protection against wetness, and estrogen replacement in the form of vaginal cream, vaginal tablets, or a time-released vaginal ring.   - continue low dose vaginal estrogen 0.5g 3x/week - discussed proper vulvar care, warm compression, avoid pad use, cotton only underwear and barrier ointment if needed  - encouraged Vit E suppository, moisturizer with Replens/Revaree   History of recurrent UTI (urinary tract infection) Assessment & Plan: - uses D- mannose - For treatment of recurrent urinary tract infections, we discussed management of recurrent UTIs including prophylaxis with a daily low dose antibiotic, transvaginal estrogen therapy, D-mannose, and cranberry supplements.  We discussed the role of diagnostic testing such as cystoscopy and upper tract imaging.   - continue low dose vaginal estrogen - prior catheterized urine testing negative culture, denies UTI symptoms    History of pelvic surgery Assessment & Plan: - h/o anterior repair by Dr. Dr. Janit in 11/10/18  - TVH by Dr. Norval and pubovaginal sling with suprapubic catheter by Dr. Andra with left ureteral injury. Percutaneous nephrostomy placement and abdominal reimplantation with stent placement in 2000 by Dr. Andra - discussed need for urodynamics prior to surgical intervention - consider renal imaging/Lasix renal scan to r/o ureteral stricture  - Cr 0.54 in 05/08/24   OAB (overactive bladder) Assessment & Plan: - POCT UA + leuk/protein/heme, pending UA microscopy and culture - PVR 2mL, catheterized for - on Vesicare  - We discussed the symptoms of overactive bladder (OAB), which include urinary urgency, urinary frequency, nocturia, with or without urge incontinence.  While we do not know the exact etiology of OAB,  several treatment options exist. We discussed management including  behavioral therapy (decreasing bladder irritants, urge suppression strategies, timed voids, bladder retraining), physical therapy, medication; for refractory cases posterior tibial nerve stimulation, sacral neuromodulation, and intravesical botulinum toxin injection.  For anticholinergic medications, we discussed the potential side effects of anticholinergics including dry eyes, dry mouth, constipation, cognitive impairment and urinary retention. For Beta-3 agonist medication, we discussed the potential side effect of elevated blood pressure which is more likely to occur in individuals with uncontrolled hypertension. - discontinue Vesicare  and switch to Trospium . Pt desires to continue due to resolution of nocturia - encouraged fluid management, Kegel exercises, and optimization of stool consistency - repeat PVR at follow-up after pessary fitting   Abnormal urinalysis Assessment & Plan: - 05/31/24 POCT UA + heme, UA micro 0-5 RBC/hpf. Negative urine culture - repeat UA at follow-up after low dose vaginal estrogen   Time spent: I spent 25 minutes dedicated to the care of this patient on the date of this encounter to include pre-visit review of records, face-to-face time with the patient discussing stage II pelvic organ prolapse, OAB, history of pelvic surgery (midurethral sling), history of rUTI, abnormal urinalysis, constipation, and post visit documentation and ordering medication/ testing.     Lianne ONEIDA Gillis, MD

## 2024-08-31 NOTE — Patient Instructions (Addendum)
 Continue to increase miralax for stool consistency.   Continue Trospium  at night.  Continue vaginal estrogen 1g 3x/week   For pessary use: - discussed risk of change in urinary or bowel symptoms, vaginal ulceration, discharge, bleeding, fistula formation. You may require multiple sizes and types for fitting.

## 2024-08-31 NOTE — Assessment & Plan Note (Signed)
-   POCT UA + leuk/protein/heme, pending UA microscopy and culture - PVR 2mL, catheterized for - on Vesicare  - We discussed the symptoms of overactive bladder (OAB), which include urinary urgency, urinary frequency, nocturia, with or without urge incontinence.  While we do not know the exact etiology of OAB, several treatment options exist. We discussed management including behavioral therapy (decreasing bladder irritants, urge suppression strategies, timed voids, bladder retraining), physical therapy, medication; for refractory cases posterior tibial nerve stimulation, sacral neuromodulation, and intravesical botulinum toxin injection.  For anticholinergic medications, we discussed the potential side effects of anticholinergics including dry eyes, dry mouth, constipation, cognitive impairment and urinary retention. For Beta-3 agonist medication, we discussed the potential side effect of elevated blood pressure which is more likely to occur in individuals with uncontrolled hypertension. - discontinue Vesicare  and switch to Trospium . Pt desires to continue due to resolution of nocturia - encouraged fluid management, Kegel exercises, and optimization of stool consistency - repeat PVR at follow-up after pessary fitting

## 2024-08-31 NOTE — Assessment & Plan Note (Signed)
-   h/o anterior repair by Dr. Dr. Janit in 11/10/18  - TVH by Dr. Norval and pubovaginal sling with suprapubic catheter by Dr. Andra with left ureteral injury. Percutaneous nephrostomy placement and abdominal reimplantation with stent placement in 2000 by Dr. Andra - discussed need for urodynamics prior to surgical intervention - consider renal imaging/Lasix renal scan to r/o ureteral stricture  - Cr 0.54 in 05/08/24

## 2024-08-31 NOTE — Assessment & Plan Note (Signed)
-   For symptomatic vaginal atrophy options include lubrication with a water-based lubricant, personal hygiene measures and barrier protection against wetness, and estrogen replacement in the form of vaginal cream, vaginal tablets, or a time-released vaginal ring.   - continue low dose vaginal estrogen 0.5g 3x/week - discussed proper vulvar care, warm compression, avoid pad use, cotton only underwear and barrier ointment if needed  - encouraged Vit E suppository, moisturizer with Replens/Revaree

## 2024-08-31 NOTE — Assessment & Plan Note (Addendum)
-   recurrent pelvic organ prolapse for 3 years - h/o cystocele repair by Dr. Janit in 11/10/18 due to prior elevated PVR - For treatment of pelvic organ prolapse, we discussed options for management including expectant management, conservative management, and surgical management, such as Kegels, a pessary, pelvic floor physical therapy, and specific surgical procedures. - continue pelvic floor PT exercises - continue to increase miralax dose to optimize stool consistency to reduce straining during bowel movements - continue low dose vaginal estrogen due to improvement of vaginal pain - failed size 3 ring with support, size 1, 2, and 3 cube pessaries due to expulsion or discomfort. Patient desires to return to try space occupying pessaries but declined today due to pending trip to [place] for 10 days. We discussed two options for prolapse repair:  1) vaginal repair without mesh - Pros - safer, no mesh complications - Cons - not as strong as mesh repair, higher risk of recurrence  2) laparoscopic repair with mesh - Pros - stronger, better long-term success - Cons - risks of mesh implant (erosion into vagina or bladder, adhering to the rectum, pain) - these risks are lower than with a vaginal mesh but still exist  3) Vaginal closure procedure - pros - strong, good long-term success - cons - unable to have penetrative intercourse - patient considering native tissue options and unsure about vaginal closure. - difficult pessary fitting due to shortened TVL and atrophy

## 2024-08-31 NOTE — Assessment & Plan Note (Signed)
-   05/31/24 POCT UA + heme, UA micro 0-5 RBC/hpf. Negative urine culture - repeat UA at follow-up after low dose vaginal estrogen

## 2024-08-31 NOTE — Assessment & Plan Note (Signed)
-   Bristol III-IV stool 3-4x/week with straining increased to 4-5x/week since daily miralax after starting Trospium  - history of diverticulosis - For constipation, we reviewed the importance of a better bowel regimen.  We also discussed the importance of avoiding chronic straining, as it can exacerbate her pelvic floor symptoms; we discussed treating constipation and straining prior to surgery, as postoperative straining can lead to damage to the repair and recurrence of symptoms. We discussed initiating therapy with increasing fluid intake, fiber supplementation, stool softeners, and laxatives such as miralax.  - encouraged squatting position and continue titration of fiber/miralax supplementation

## 2024-08-31 NOTE — Assessment & Plan Note (Signed)
-   uses D- mannose - For treatment of recurrent urinary tract infections, we discussed management of recurrent UTIs including prophylaxis with a daily low dose antibiotic, transvaginal estrogen therapy, D-mannose, and cranberry supplements.  We discussed the role of diagnostic testing such as cystoscopy and upper tract imaging.   - continue low dose vaginal estrogen - prior catheterized urine testing negative culture, denies UTI symptoms

## 2024-10-11 ENCOUNTER — Ambulatory Visit: Admitting: Obstetrics and Gynecology

## 2024-10-26 ENCOUNTER — Other Ambulatory Visit: Payer: Self-pay | Admitting: Family Medicine

## 2024-10-26 DIAGNOSIS — Z1231 Encounter for screening mammogram for malignant neoplasm of breast: Secondary | ICD-10-CM

## 2024-11-05 NOTE — Progress Notes (Signed)
 "  Established Patient Office Visit  Subjective:  Patient ID: Vanessa French, female    DOB: 11-29-1950  Age: 74 y.o. MRN: [id number]  CC: No chief complaint on file.     HPI Vanessa French is here for ***.   Past Medical History:  Diagnosis Date   Acute pulmonary embolism [facility]) Post Covid - July 2022 05/14/2021   Cataract 2014   Removed   Closed fracture of right proximal humerus 04/07/2012   Diverticulosis of colon 02/14/2020   Frequent UTI    Ganglion cyst of wrist, left 08/11/2021   GERD (gastroesophageal reflux disease)    History of pulmonary embolus (PE) 11/03/2012   Hyperlipidemia    Hypertension    Left ureteral injury 08/29/2018   Prolapse of anterior vaginal wall    Psoriasis    Thoracic compression fracture [facility])     Past Surgical History:  Procedure Laterality Date   ABDOMINAL HYSTERECTOMY  2000   ANTERIOR AND POSTERIOR VAGINAL REPAIR     APPENDECTOMY  1959   BLADDER SUSPENSION  11/1998   BLADDER SUSPENSION     BREAST CYST ASPIRATION Right 2011   CATARACT EXTRACTION, BILATERAL Bilateral 2016   Dr. Charmayne MASH REPAIR  11/10/2018   Dr. Lamar Sar, [facility]   CYSTOCELE REPAIR  11/10/2018   Dr. Lamar Sar   EXTENSOR TENDON OF FOREARM / WRIST REPAIR Left 2010   LEFT ARM   EYE SURGERY  2014   Cataract surgery   FRACTURE SURGERY  2013   Right humorous   GANGLION CYST EXCISION Left 09/29/2021   Procedure: EXCISION VOLAR CYST LEFT WRIST RECURRENT;  Surgeon: Murrell Kuba, MD;  Location:  SURGERY CENTER;  Service: Orthopedics;  Laterality: Left;  regional with MAC   ORIF HUMERUS FRACTURE  04/07/2012   Procedure: OPEN REDUCTION INTERNAL FIXATION (ORIF) PROXIMAL HUMERUS FRACTURE;  Surgeon: Oneil JAYSON Herald, MD;  Location: [facility] OR;  Service: Orthopedics;  Laterality: Right;  Open Reduction Internal Fixation Right Proxmial Humerus   STERIOD INJECTION  04/07/2012   Procedure: STEROID INJECTION;  Surgeon: Oneil JAYSON Herald, MD;  Location: [facility] OR;  Service:  Orthopedics;  Laterality: Right;   Right 1st Dorsal Compartment Injection   TONSILLECTOMY  1962   URETER REVISION Left 11/3003   COMPLIATION OF BLADDER SLING   vaginal hysterectomy with unilateral salpingooopherectomy  2000    Family History  Problem Relation Age of Onset   Heart disease Mother    Arthritis Mother    Hyperlipidemia Mother    Hypertension Mother    Colon cancer Father 54   Early death Father    Atrial fibrillation Sister    Stroke Maternal Grandmother    Heart disease Maternal Grandfather    Heart failure Maternal Grandfather 86   Kidney Stones Son    Diabetes Son    Colon cancer Maternal Uncle    Bladder Cancer Neg Hx    Renal cancer Neg Hx    Uterine cancer Neg Hx     Social History   Socioeconomic History   Marital status: Married    Spouse name: Not on file   Number of children: 3   Years of education: Not on file   Highest education level: Bachelor's degree (e.g., BA, AB, BS)  Occupational History   Occupation: Teacher, Adult Education: [employer]  Tobacco Use   Smoking status: Never   Smokeless tobacco: Never  Vaping Use   Vaping status: Never Used  Substance and  Sexual Activity   Alcohol use: No   Drug use: No   Sexual activity: Yes    Partners: Male    Birth control/protection: Post-menopausal  Other Topics Concern   Not on file  Social History Narrative   Not on file   Social Drivers of Health   Tobacco Use: Low Risk (08/31/2024)   Patient History    Smoking Tobacco Use: Never    Smokeless Tobacco Use: Never    Passive Exposure: Not on file  Financial Resource Strain: Low Risk (10/30/2024)   Overall Financial Resource Strain (CARDIA)    Difficulty of Paying Living Expenses: Not hard at all  Food Insecurity: No Food Insecurity (10/30/2024)   Epic    Worried About Radiation Protection Practitioner of Food in the Last Year: Never true    Ran Out of Food in the Last Year: Never true  Transportation Needs: No Transportation Needs (10/30/2024)   Epic    Lack of  Transportation (Medical): No    Lack of Transportation (Non-Medical): No  Physical Activity: Inactive (10/30/2024)   Exercise Vital Sign    Days of Exercise per Week: 0 days    Minutes of Exercise per Session: Not on file  Stress: No Stress Concern Present (10/30/2024)   [employer] of Occupational Health - Occupational Stress Questionnaire    Feeling of Stress: Not at all  Social Connections: Socially Integrated (10/30/2024)   Social Connection and Isolation Panel    Frequency of Communication with Friends and Family: Three times a week    Frequency of Social Gatherings with Friends and Family: Twice a week    Attends Religious Services: More than 4 times per year    Active Member of [employer] or Organizations: Yes    Attends Engineer, Structural: More than 4 times per year    Marital Status: Married  Catering Manager Violence: Not At Risk (02/21/2024)   Humiliation, Afraid, Rape, and Kick questionnaire    Fear of Current or Ex-Partner: No    Emotionally Abused: No    Physically Abused: No    Sexually Abused: No  Depression (PHQ2-9): Low Risk (02/21/2024)   Depression (PHQ2-9)    PHQ-2 Score: 0  Alcohol Screen: Low Risk (02/21/2024)   Alcohol Screen    Last Alcohol Screening Score (AUDIT): 0  Housing: Low Risk (10/30/2024)   Epic    Unable to Pay for Housing in the Last Year: No    Number of Times Moved in the Last Year: 0    Homeless in the Last Year: No  Utilities: Not At Risk (02/21/2024)   [facility] Utilities    Threatened with loss of utilities: No  Health Literacy: Adequate Health Literacy (02/21/2024)   B1300 Health Literacy    Frequency of need for help with medical instructions: Never    ROS All ROS negative except what is listed in the HPI.   Objective:   Today's Vitals: There were no vitals taken for this visit.  Physical Exam  Assessment & Plan:   Problem List Items Addressed This Visit       Active Problems   Anxiety (Chronic)   History of prediabetes  (Chronic)   Hyperlipidemia (Chronic)   Essential hypertension - Primary (Chronic)      Follow-up: No follow-ups on file.   Waddell FURY Almarie, DNP, FNP-C  I,Emily Lagle,acting as a neurosurgeon for Waddell KATHEE Almarie, NP.,have documented all relevant documentation on the behalf of Waddell KATHEE Almarie, NP.  I, Waddell KATHEE Almarie, NP,  have reviewed all documentation for this visit. The documentation on 11/06/2024 for the exam, diagnosis, procedures, and orders are all accurate and complete. "

## 2024-11-06 ENCOUNTER — Encounter: Payer: Self-pay | Admitting: Family Medicine

## 2024-11-06 ENCOUNTER — Ambulatory Visit: Admitting: Family Medicine

## 2024-11-06 VITALS — BP 129/75 | HR 98 | Temp 97.8°F | Resp 16 | Ht 62.0 in | Wt 172.0 lb

## 2024-11-06 DIAGNOSIS — Z0001 Encounter for general adult medical examination with abnormal findings: Secondary | ICD-10-CM

## 2024-11-06 DIAGNOSIS — R42 Dizziness and giddiness: Secondary | ICD-10-CM

## 2024-11-06 DIAGNOSIS — Z87898 Personal history of other specified conditions: Secondary | ICD-10-CM | POA: Diagnosis not present

## 2024-11-06 DIAGNOSIS — E785 Hyperlipidemia, unspecified: Secondary | ICD-10-CM | POA: Diagnosis not present

## 2024-11-06 DIAGNOSIS — I10 Essential (primary) hypertension: Secondary | ICD-10-CM | POA: Diagnosis not present

## 2024-11-06 DIAGNOSIS — F419 Anxiety disorder, unspecified: Secondary | ICD-10-CM

## 2024-11-06 LAB — CBC WITH DIFFERENTIAL/PLATELET
Basophils Absolute: 0 K/uL (ref 0.0–0.1)
Basophils Relative: 0.6 % (ref 0.0–3.0)
Eosinophils Absolute: 0.1 K/uL (ref 0.0–0.7)
Eosinophils Relative: 1.6 % (ref 0.0–5.0)
HCT: 45.8 % (ref 36.0–46.0)
Hemoglobin: 15.2 g/dL — ABNORMAL HIGH (ref 12.0–15.0)
Lymphocytes Relative: 20.7 % (ref 12.0–46.0)
Lymphs Abs: 1.6 K/uL (ref 0.7–4.0)
MCHC: 33.2 g/dL (ref 30.0–36.0)
MCV: 91.8 fl (ref 78.0–100.0)
Monocytes Absolute: 0.7 K/uL (ref 0.1–1.0)
Monocytes Relative: 8.8 % (ref 3.0–12.0)
Neutro Abs: 5.4 K/uL (ref 1.4–7.7)
Neutrophils Relative %: 68.3 % (ref 43.0–77.0)
Platelets: 210 K/uL (ref 150.0–400.0)
RBC: 4.99 Mil/uL (ref 3.87–5.11)
RDW: 13.2 % (ref 11.5–15.5)
WBC: 7.8 K/uL (ref 4.0–10.5)

## 2024-11-06 LAB — BASIC METABOLIC PANEL WITH GFR
BUN: 12 mg/dL (ref 6–23)
CO2: 29 meq/L (ref 19–32)
Calcium: 8.8 mg/dL (ref 8.4–10.5)
Chloride: 97 meq/L (ref 96–112)
Creatinine, Ser: 0.56 mg/dL (ref 0.40–1.20)
GFR: 90.81 mL/min
Glucose, Bld: 94 mg/dL (ref 70–99)
Potassium: 3.7 meq/L (ref 3.5–5.1)
Sodium: 136 meq/L (ref 135–145)

## 2024-11-06 LAB — HEMOGLOBIN A1C: Hgb A1c MFr Bld: 5.8 % (ref 4.6–6.5)

## 2024-11-07 ENCOUNTER — Ambulatory Visit: Payer: Self-pay | Admitting: Family Medicine

## 2024-11-22 ENCOUNTER — Other Ambulatory Visit: Payer: Self-pay | Admitting: Obstetrics

## 2024-11-22 DIAGNOSIS — N3281 Overactive bladder: Secondary | ICD-10-CM

## 2024-12-10 ENCOUNTER — Ambulatory Visit

## 2025-02-04 ENCOUNTER — Ambulatory Visit: Admitting: Physician Assistant

## 2025-02-26 ENCOUNTER — Ambulatory Visit

## 2025-02-27 ENCOUNTER — Ambulatory Visit

## 2025-06-07 ENCOUNTER — Ambulatory Visit: Admitting: Family Medicine
